# Patient Record
Sex: Female | Born: 1965 | Race: White | Hispanic: No | Marital: Married | Smoking: Former smoker
Health system: Southern US, Community
[De-identification: ages and names within clinical notes are randomized; demographics above are authoritative.]

## PROBLEM LIST (undated history)

## (undated) DIAGNOSIS — K648 Other hemorrhoids: Secondary | ICD-10-CM

## (undated) DIAGNOSIS — K219 Gastro-esophageal reflux disease without esophagitis: Secondary | ICD-10-CM

## (undated) DIAGNOSIS — A630 Anogenital (venereal) warts: Secondary | ICD-10-CM

## (undated) DIAGNOSIS — E039 Hypothyroidism, unspecified: Secondary | ICD-10-CM

## (undated) DIAGNOSIS — T7840XA Allergy, unspecified, initial encounter: Secondary | ICD-10-CM

## (undated) HISTORY — DX: Allergy, unspecified, initial encounter: T78.40XA

## (undated) HISTORY — DX: Other hemorrhoids: K64.8

## (undated) HISTORY — PX: OTHER SURGICAL HISTORY: SHX169

## (undated) HISTORY — DX: Gastro-esophageal reflux disease without esophagitis: K21.9

## (undated) HISTORY — DX: Anogenital (venereal) warts: A63.0

## (undated) HISTORY — PX: APPENDECTOMY: SHX54

## (undated) HISTORY — PX: DILATION AND CURETTAGE OF UTERUS: SHX78

---

## 1998-04-25 ENCOUNTER — Other Ambulatory Visit: Admission: RE | Admit: 1998-04-25 | Discharge: 1998-04-25 | Payer: Self-pay | Admitting: Obstetrics and Gynecology

## 1999-11-12 ENCOUNTER — Ambulatory Visit (HOSPITAL_COMMUNITY): Admission: RE | Admit: 1999-11-12 | Discharge: 1999-11-12 | Payer: Self-pay | Admitting: Obstetrics and Gynecology

## 1999-12-10 ENCOUNTER — Other Ambulatory Visit: Admission: RE | Admit: 1999-12-10 | Discharge: 1999-12-10 | Payer: Self-pay | Admitting: Obstetrics and Gynecology

## 2002-02-15 ENCOUNTER — Other Ambulatory Visit: Admission: RE | Admit: 2002-02-15 | Discharge: 2002-02-15 | Payer: Self-pay | Admitting: Obstetrics and Gynecology

## 2003-07-19 ENCOUNTER — Other Ambulatory Visit: Admission: RE | Admit: 2003-07-19 | Discharge: 2003-07-19 | Payer: Self-pay | Admitting: Obstetrics and Gynecology

## 2003-07-27 ENCOUNTER — Ambulatory Visit (HOSPITAL_BASED_OUTPATIENT_CLINIC_OR_DEPARTMENT_OTHER): Admission: RE | Admit: 2003-07-27 | Discharge: 2003-07-27 | Payer: Self-pay | Admitting: Obstetrics and Gynecology

## 2003-07-27 ENCOUNTER — Encounter (INDEPENDENT_AMBULATORY_CARE_PROVIDER_SITE_OTHER): Payer: Self-pay | Admitting: Specialist

## 2003-07-27 ENCOUNTER — Ambulatory Visit (HOSPITAL_COMMUNITY): Admission: RE | Admit: 2003-07-27 | Discharge: 2003-07-27 | Payer: Self-pay | Admitting: Obstetrics and Gynecology

## 2003-08-24 ENCOUNTER — Ambulatory Visit (HOSPITAL_COMMUNITY): Admission: RE | Admit: 2003-08-24 | Discharge: 2003-08-24 | Payer: Self-pay | Admitting: Obstetrics and Gynecology

## 2003-08-24 IMAGING — US US SONOHYSTEROGRAM - WITH RAD
1 series · 17 of 25 positions shown · non-contrast
Comparison: none

CLINICAL DATA: Evaluate for fibroids and endometrial lining.
TRANSABDOMINAL AND TRANSVAGINAL PELVIC ULTRASOUND 
Multiple images of the uterus and adnexa were obtained using a transabdominal and endovaginal approaches. 
The uterus has a maximal sagittal length of 8.3 cm and maximal AP width of 3.1 cm.  The uterine myometrium demonstrates two areas of focally altered echotexture.  One in the left lateral fundal region measuring 1.9 x 2.0 x 2.1 cm and a second located in the posterior upper uterine segment measuring 1.1 x 1.6 cm.  The endometrial canal appears trilayered in appearance with a maximal AP width of .8 cm.  No focal thickening is noted.  
The left ovary measures 3.3 x 2.2 x 2.2 cm and contains a collapsing corpus luteum cyst.  The right ovary was best seen transabdominally and measures 3.2 x 4.6 x 3.9 cm.  
No cul-de-sac or periovarian fluid is noted.
IMPRESSION
Fibroid uterus with fibroid sizes and locations as noted above.  Normal ovaries   
SONOHYSTEROGRAM:
Following sterile cleansing of the external cervical os with Betadine, sonohysterography was performed via the installation of a 5 French sonohysterography catheter which was stabilized in the endocervical canal with a saline-filled end catheter balloon.  Approximately 25 cc in total of saline was injected into the endometrial canal over the course of the exam.  It was difficult to fully distend the canal with saline due to a rapid egress of saline through the fallopian tubes.  
The posterior upper uterine segment fibroid demonstrates a partial submucosal component approximated at 40%.  The fundal fibroid does not have any submucosal component.  The endometrial canal appears thin and echogenic throughout with a single layer measurement of 2.5 mm.  No areas of focal thickening or inhomogeniety are identified and no findings suggestive of synechia are seen.
A partial submucosal component to the posterior upper segment fibroid is noted.  It should be noted that the overall sonohysterographic evaluation was limited by the inability to complete distend the endometrial canal with saline due to spill from the tubes.

[Series 1: us transvaginal non-ob · 17 of 49 slices shown]
[im 1/49]
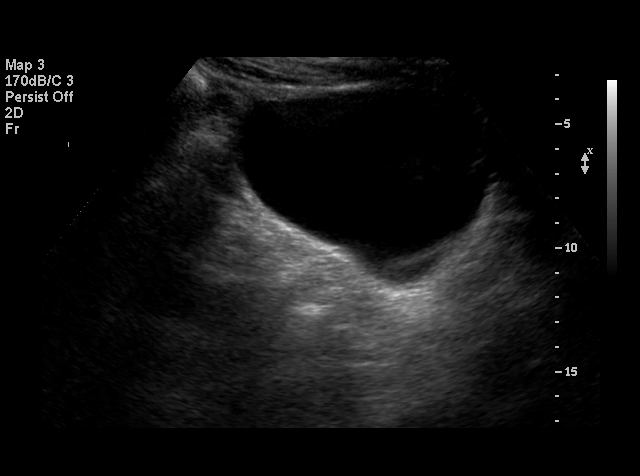
[im 5/49]
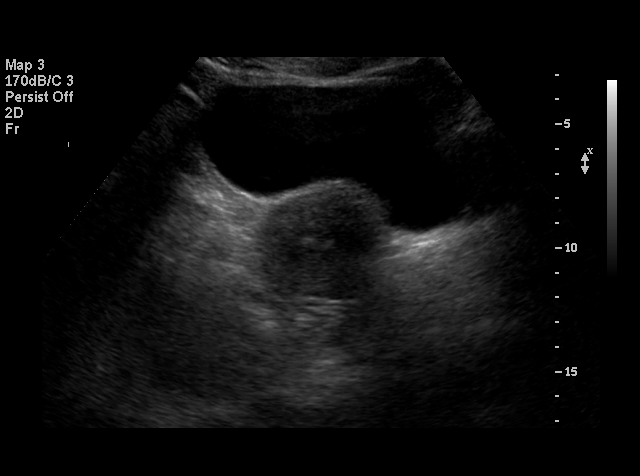
[im 7/49]
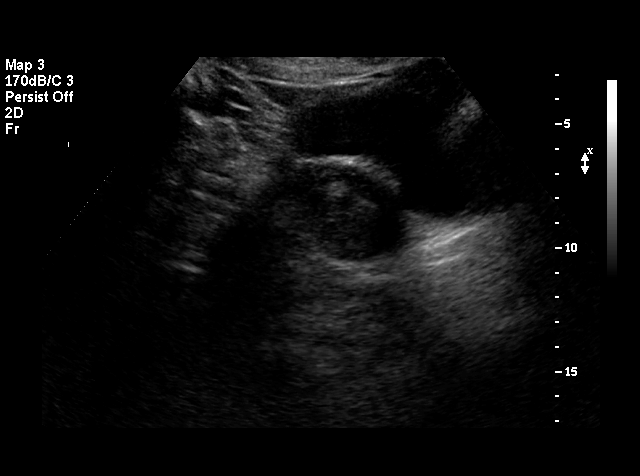
[im 11/49]
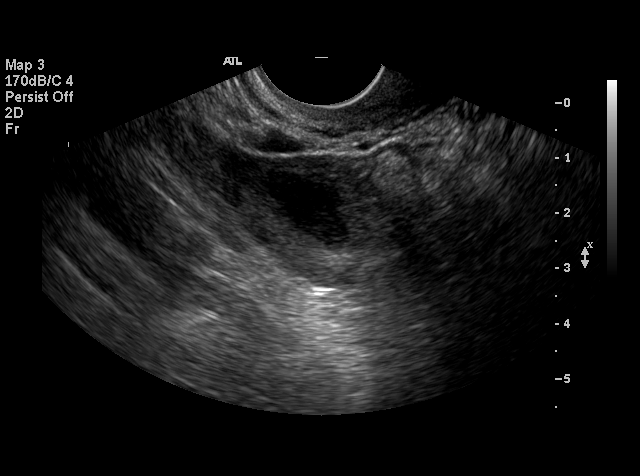
[im 13/49]
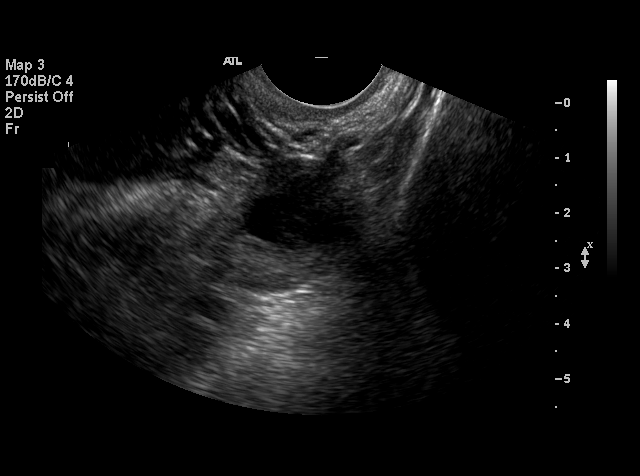
[im 17/49]
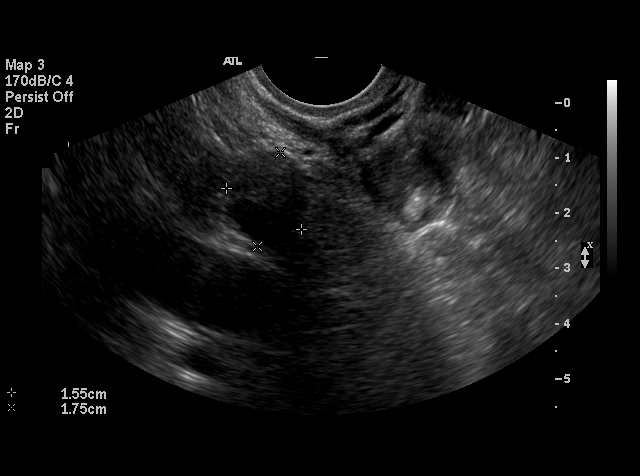
[im 19/49]
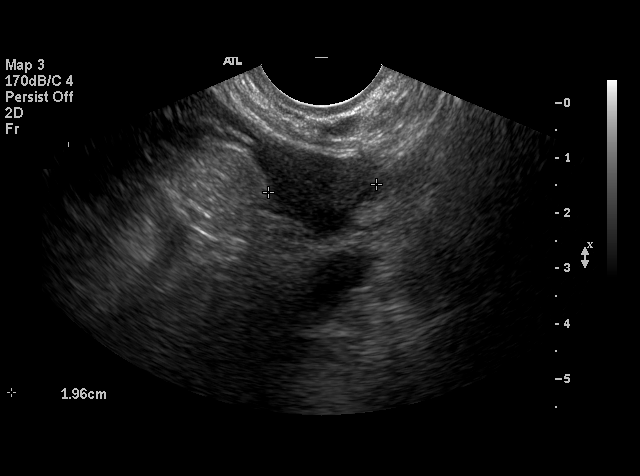
[im 23/49]
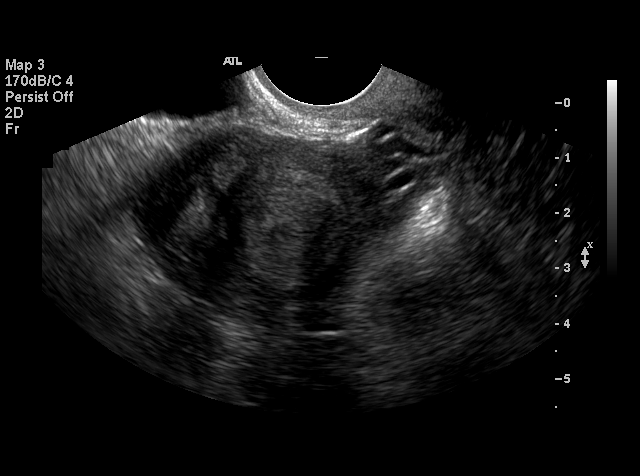
[im 25/49]
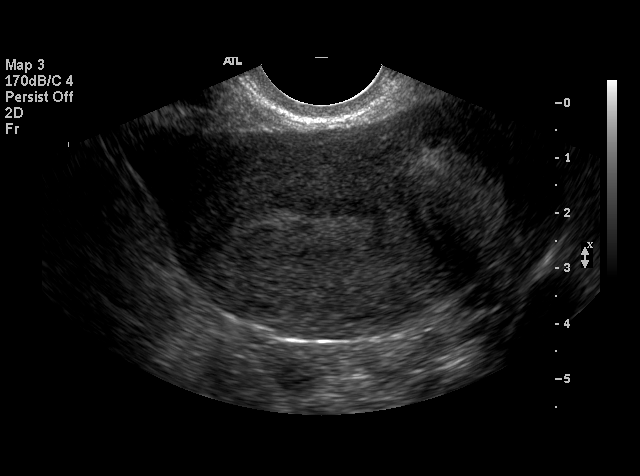
[im 27/49]
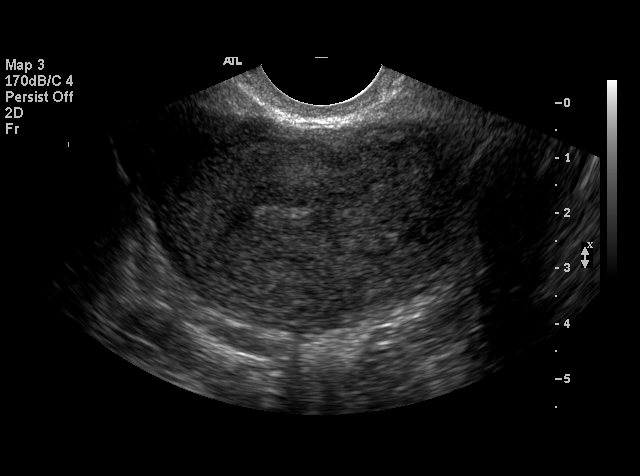
[im 31/49]
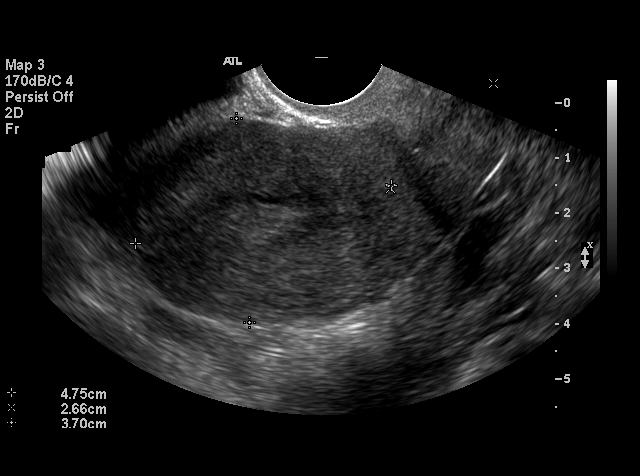
[im 33/49]
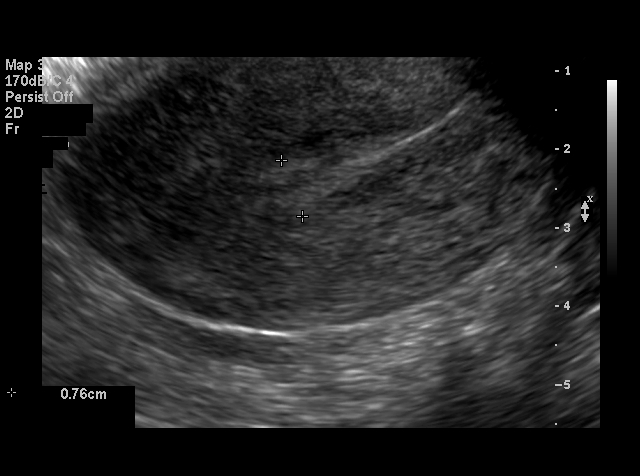
[im 37/49]
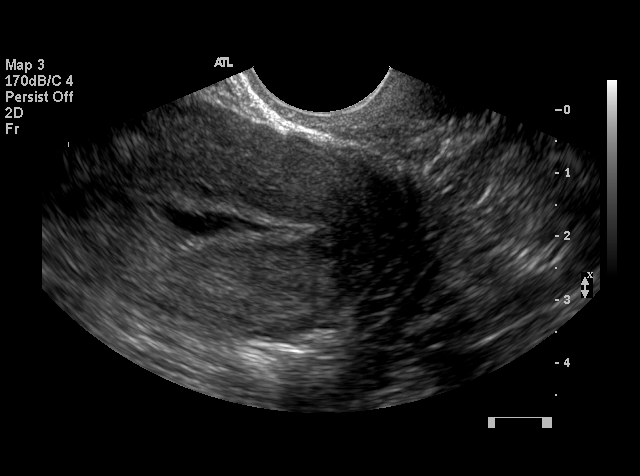
[im 39/49]
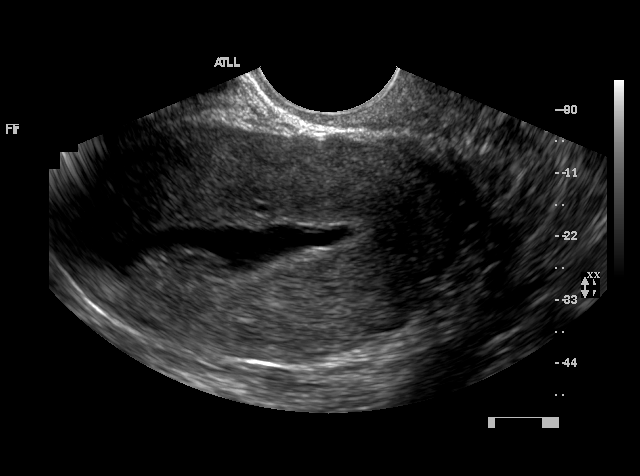
[im 43/49]
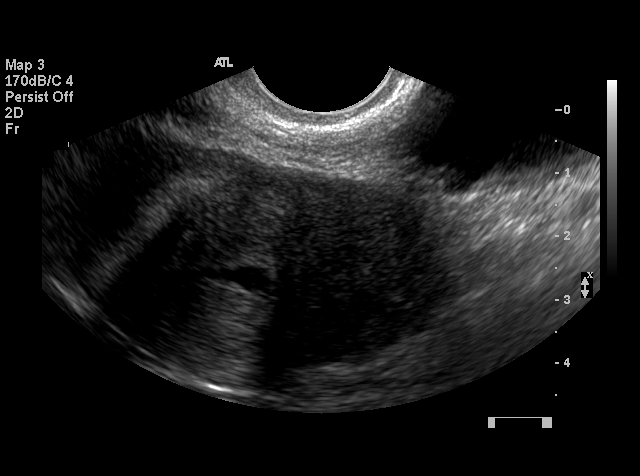
[im 45/49]
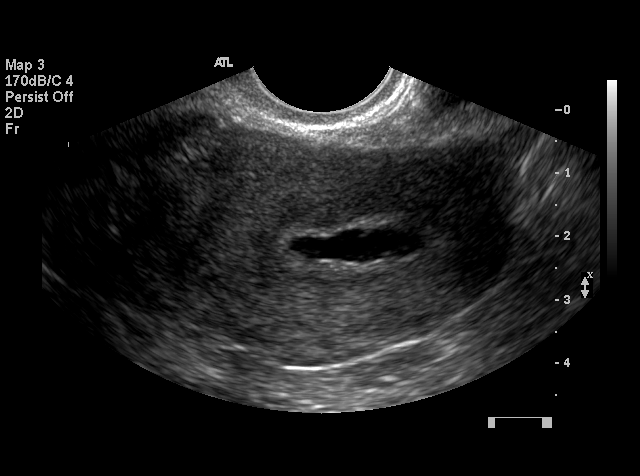
[im 49/49]
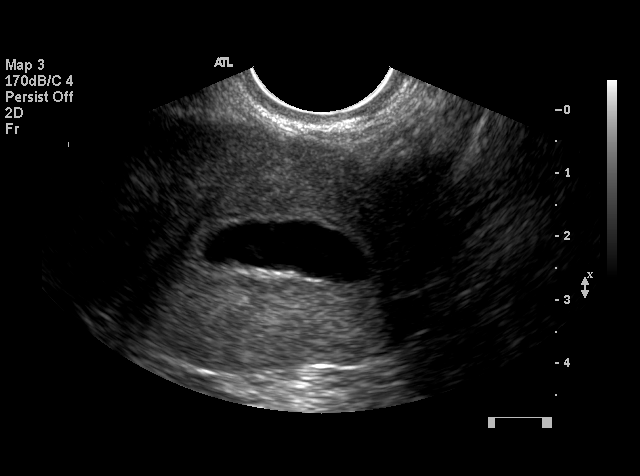

[17 of 25 positions shown; findings below may reference images not displayed]

## 2003-11-10 ENCOUNTER — Encounter (INDEPENDENT_AMBULATORY_CARE_PROVIDER_SITE_OTHER): Payer: Self-pay | Admitting: Specialist

## 2003-11-10 ENCOUNTER — Ambulatory Visit (HOSPITAL_COMMUNITY): Admission: AD | Admit: 2003-11-10 | Discharge: 2003-11-10 | Payer: Self-pay | Admitting: Obstetrics and Gynecology

## 2003-11-10 IMAGING — US US OB TRANSVAGINAL MODIFY
1 series · 18 of 28 positions shown · non-contrast
Comparison: none

CLINICAL DATA: 7 week 6 day gestational age by LMP.  Previous IUP seen on outside ultrasound.  Bleeding. Evaluate for missed abortion or retained products.  Fibroids.
 OBSTETRICAL ULTRASOUND WITH TRANSVAGINAL:
 Comparison is made with prior study on [DATE].
 There is no sonographic evidence of an intrauterine gestational sac.  No fluid is seen within the endometrial cavity.  The endometrium measures approximately 7 mm in thickness.  A fibroid is seen in the left upper uterine body measuring approximately 2.2 x 2.2 x 2.0 cm.  This has not significantly changed in size since prior study.  Other previously seen submucosal fibroid in the posterior uterine wall is not well visualized on the current study. 
 A simple right ovarian cyst is seen measuring 2.1 x 2.5 x 2.2 cm.  This is consistent with a corpus luteum cyst.  The left ovary is normal in appearance.  No other adnexal masses or free fluid are visualized by either transabdominal or transvaginal imaging.
 IMPRESSION
 No evidence of intrauterine gestational sac or retained products of conception.  
 2.2 cm fibroid in the left upper uterine body.
 2.5 cm simple right ovarian cyst, consistent with corpus luteum.  Normal appearance of the left ovary.

[Series 1: us ob transvaginal modify · 18 of 57 slices shown]
[im 1/57]
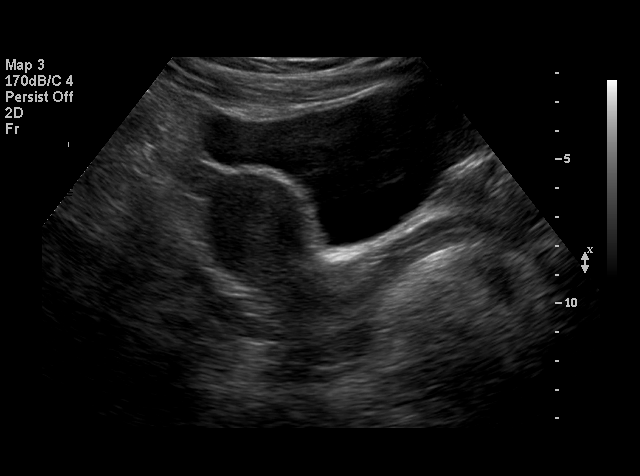
[im 5/57]
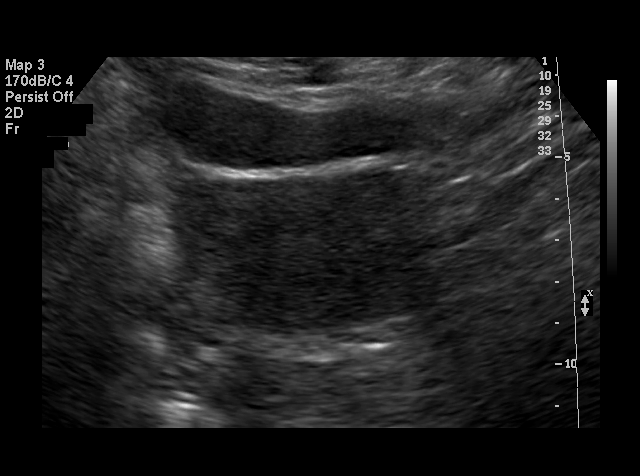
[im 7/57]
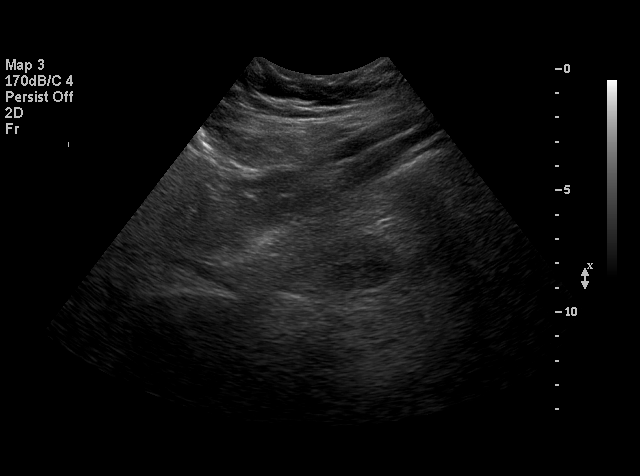
[im 11/57]
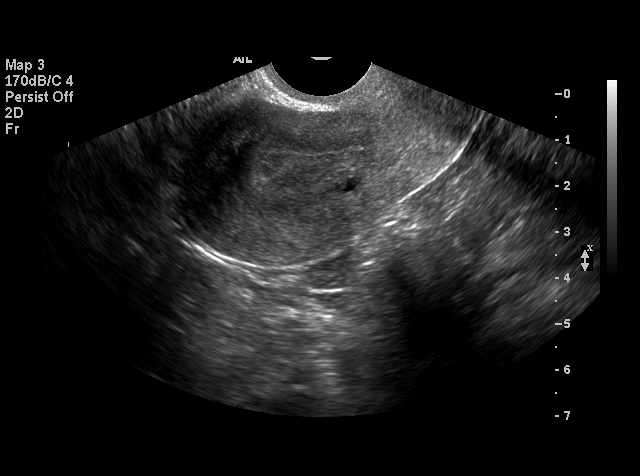
[im 15/57]
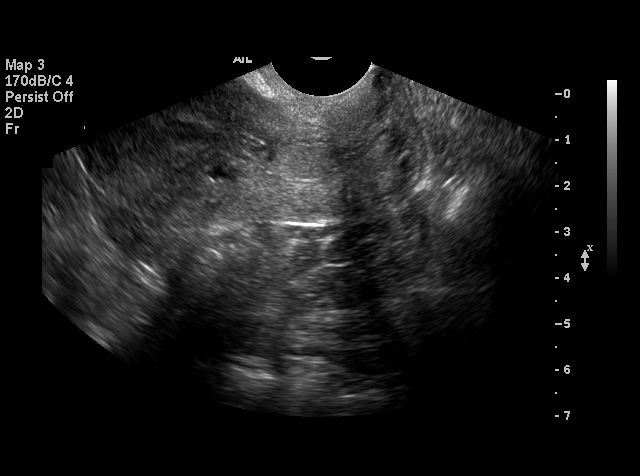
[im 17/57]
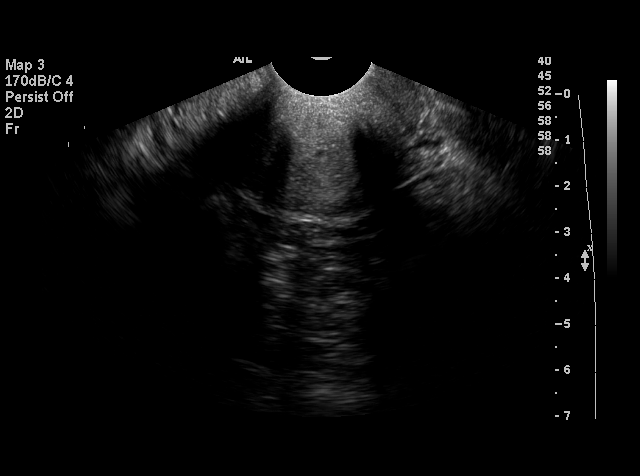
[im 21/57]
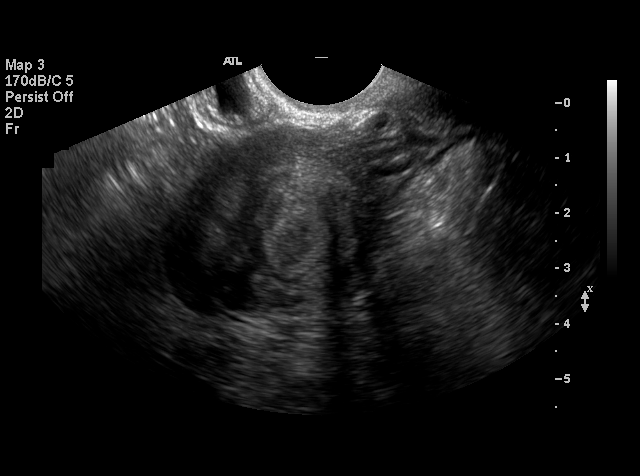
[im 23/57]
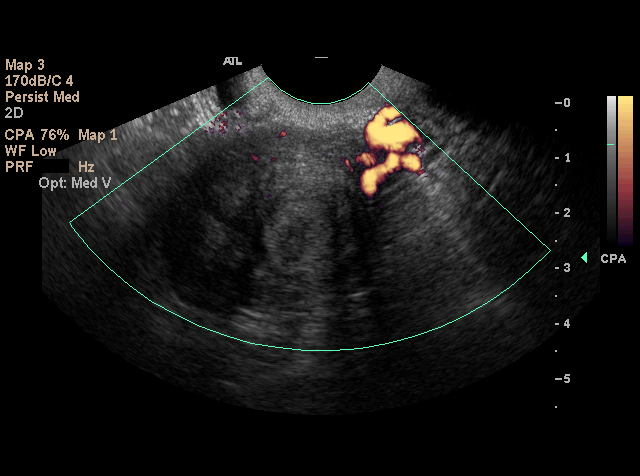
[im 27/57]
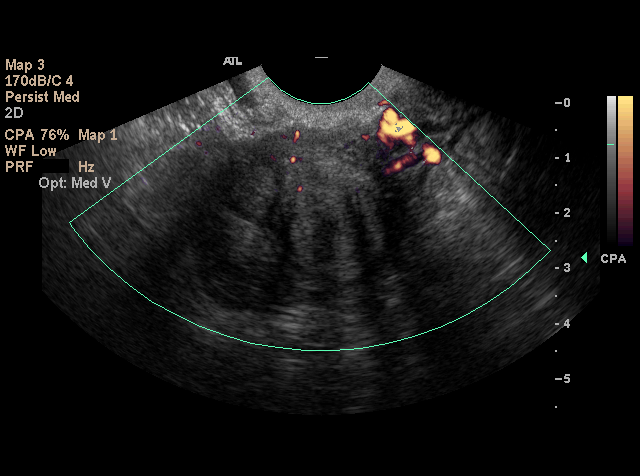
[im 30/57]
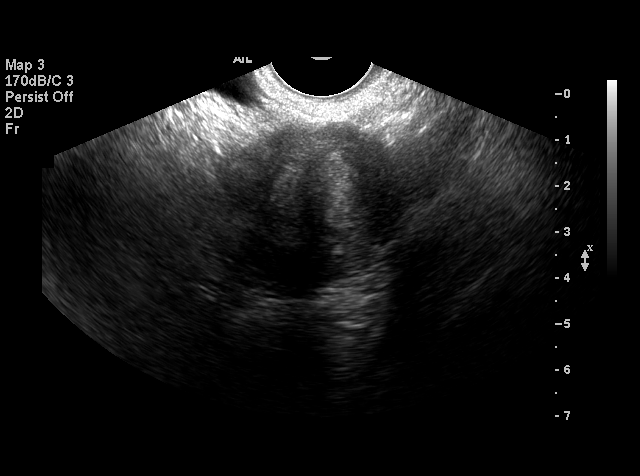
[im 34/57]
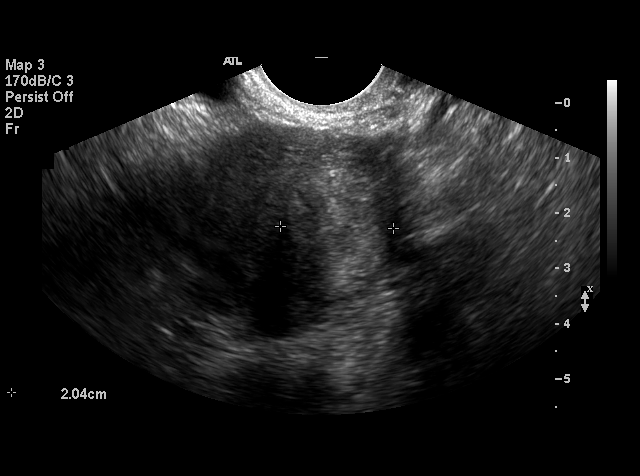
[im 36/57]
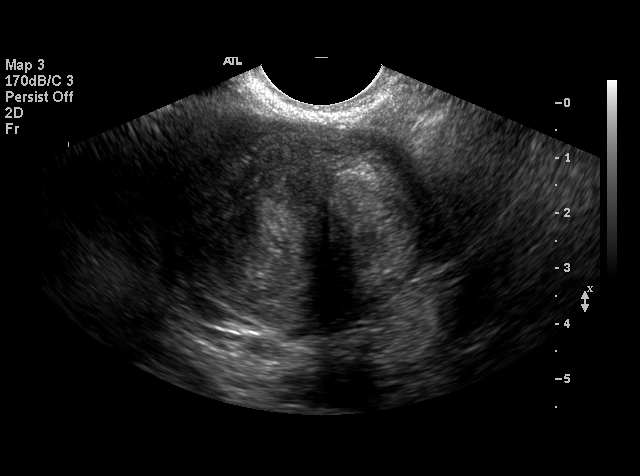
[im 40/57]
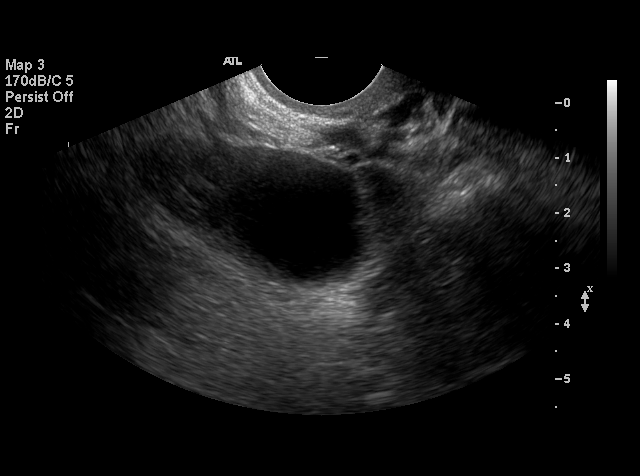
[im 44/57]
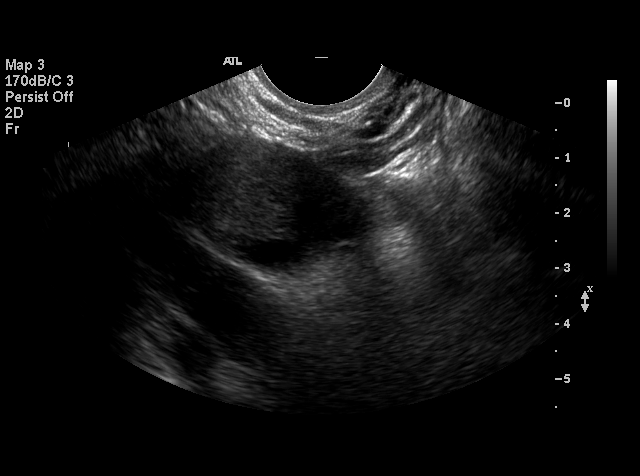
[im 46/57]
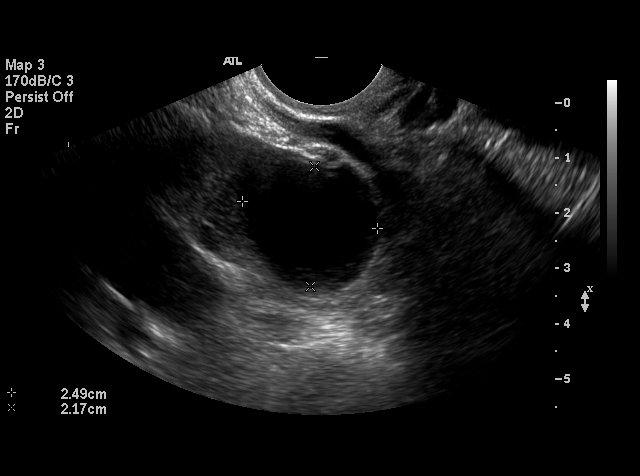
[im 50/57]
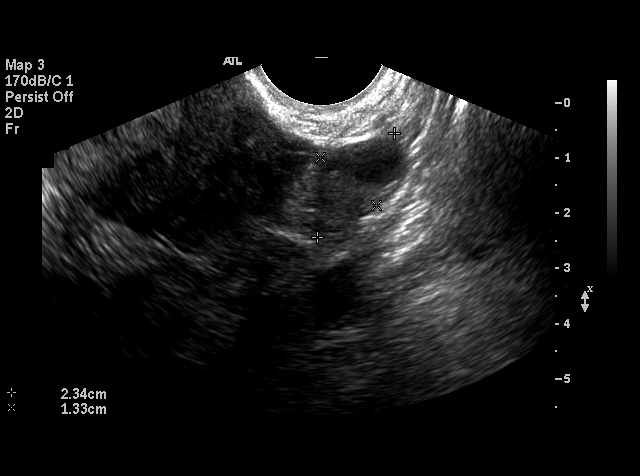
[im 52/57]
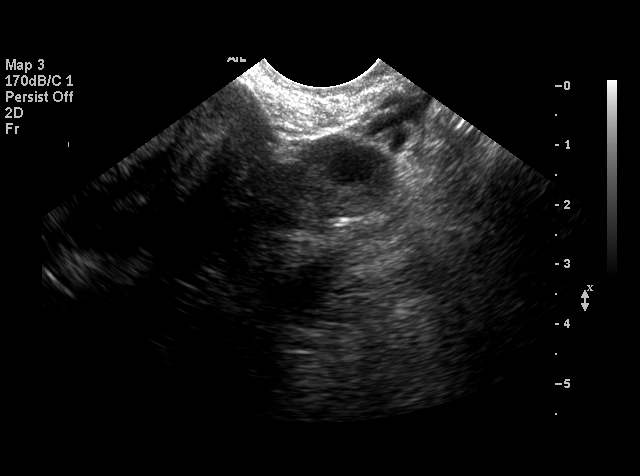
[im 57/57]
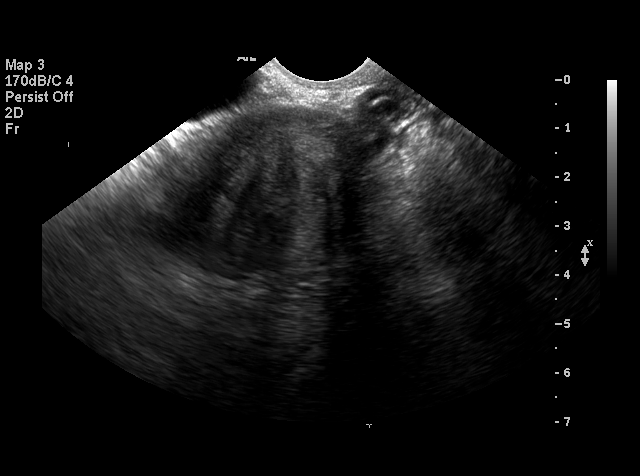

[18 of 28 positions shown; findings below may reference images not displayed]

## 2004-09-26 ENCOUNTER — Ambulatory Visit: Payer: Self-pay | Admitting: Internal Medicine

## 2004-11-28 ENCOUNTER — Ambulatory Visit: Payer: Self-pay | Admitting: Internal Medicine

## 2004-12-04 ENCOUNTER — Ambulatory Visit: Payer: Self-pay

## 2004-12-18 ENCOUNTER — Other Ambulatory Visit: Admission: RE | Admit: 2004-12-18 | Discharge: 2004-12-18 | Payer: Self-pay | Admitting: Obstetrics and Gynecology

## 2005-01-01 ENCOUNTER — Ambulatory Visit: Payer: Self-pay | Admitting: Internal Medicine

## 2006-10-09 ENCOUNTER — Ambulatory Visit: Payer: Self-pay | Admitting: Internal Medicine

## 2007-01-05 LAB — CONVERTED CEMR LAB: Pap Smear: NORMAL

## 2007-01-19 IMAGING — US MAMMO-RUNI-US
1 series · 5 of 5 positions shown · non-contrast
Comparison: NONE

CLINICAL DATA: BANDAOGO RT(R)(M)(CT)   
Diagnostic  Mammogram. 

RIGHT BREAST MAMMOGRAM ADDITIONAL VIEWS AND RIGHT BREAST 
ULTRASOUND

[Series 1: us breast · 0.06mm/px · 5 of 5 slices shown]
[im 1/5]
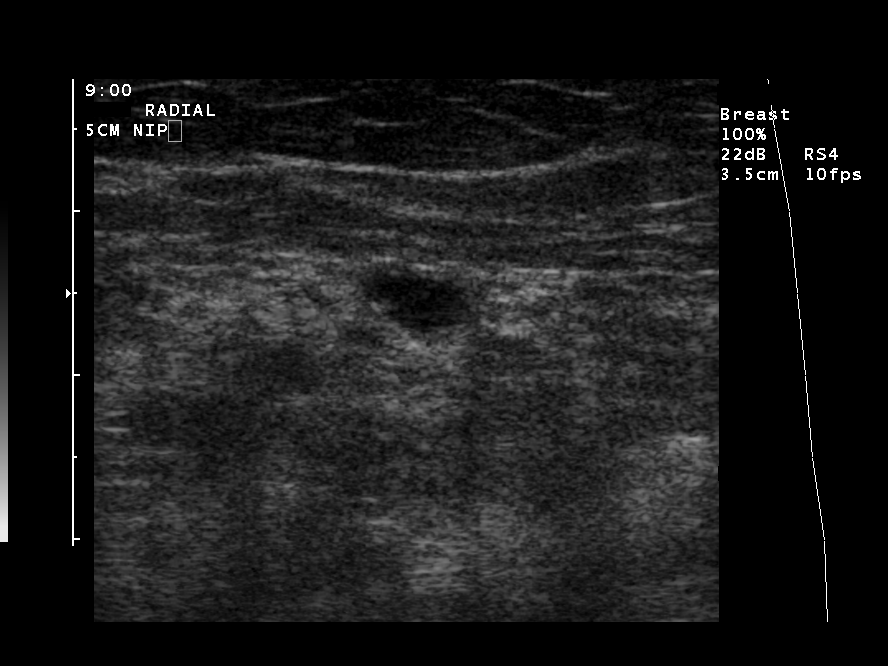
[im 2/5]
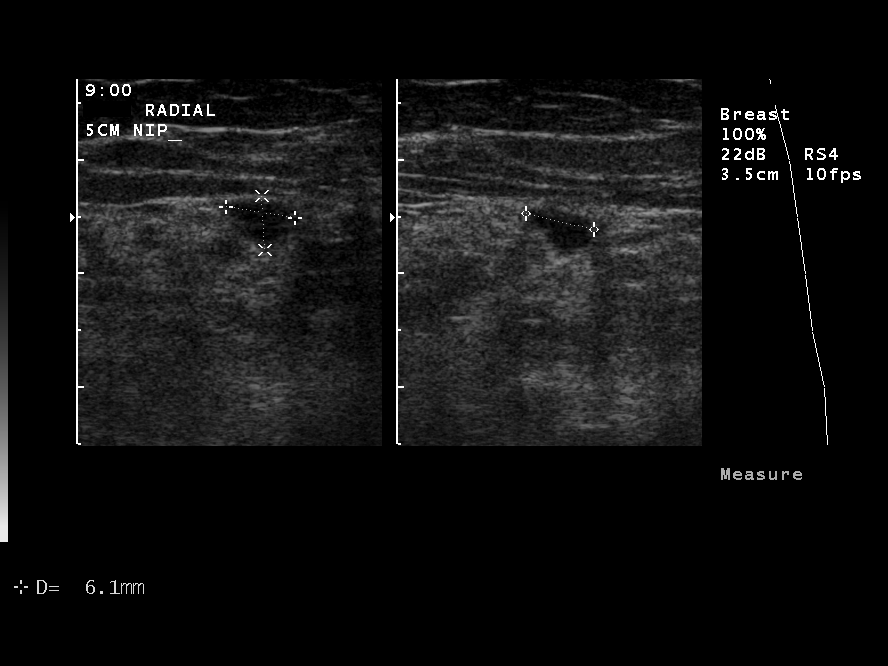
[im 3/5]
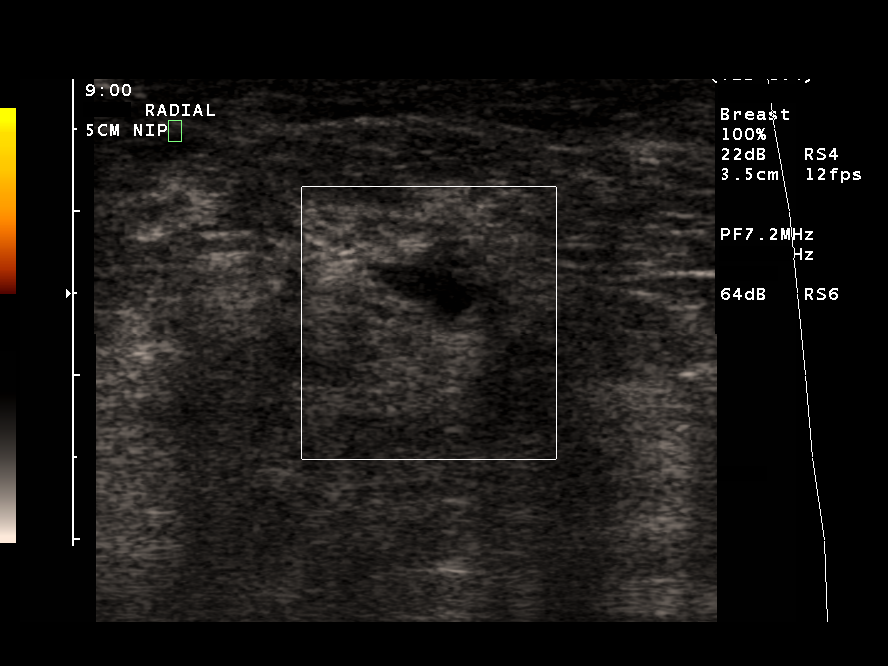
[im 4/5]
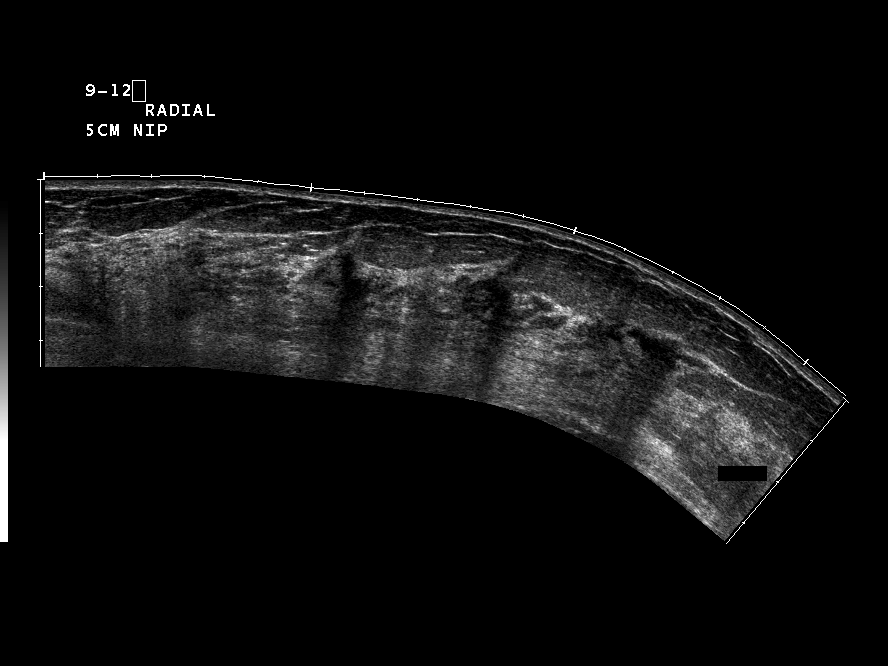
[im 5/5]
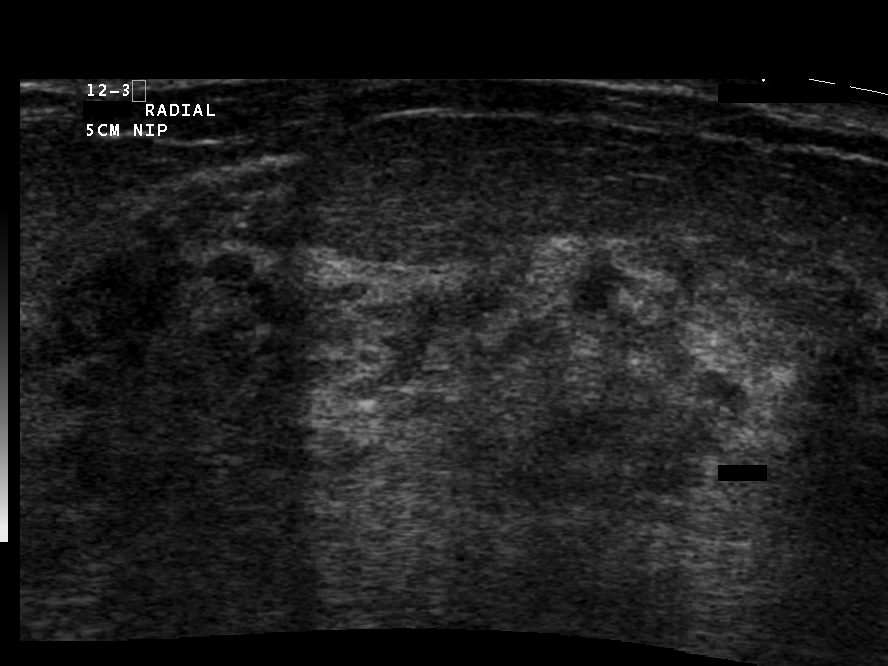

[5 of 5 positions shown; findings below may reference images not displayed]

FINDINGS: There is moderately dense breast parenchyma.  A spot 
compression view was obtained in the upper half of the breast in 
the MLO position.  A repeat MLO view was also obtained.  There are 
benign appearing calcifications seen.  No evidence of spiculated 
mass or architectural distortion.  An exaggerated lateral CC view 
was also obtained.  No dominant mass was noted. Ultrasound was 
performed of the upper half of the right breast.  There is a 
hypoechoic, oval shaped, well-circumscribed mass seen at the [DATE] 
position, 5 cm from the nipple.  There is enhanced through 
transmission.  This has a smooth margin.  There is some internal 
echogenicity suggested.  No abnormal acoustic attenuation.
IMPRESSION: No mammographic or ultrasonographic evidence of 
malignancy. Hypoechoic lesion in the [DATE] position of the right 
breast 5 cm from the nipple that may represent a complex cyst or 
solid nodule.  I would favor the former.  I would recommend a 
six-month interval follow-up right breast ultrasound to 
re-evaluate this finding. The patient was informed at the time of 
the examination of the findings and recommendations by verbal and 
written lay report. Computer assisted (Second Look) technology was 
used as an aid in interpretation of this study. BI-RADS 3 - 
Probably Benign BANDAOGO electronically reviewed on 
[DATE] Dict Date: [DATE]  Tran Date: [DATE] BANDAOGO

## 2007-02-16 ENCOUNTER — Ambulatory Visit: Payer: Self-pay | Admitting: Internal Medicine

## 2007-02-16 LAB — CONVERTED CEMR LAB
ALT: 12 units/L (ref 0–35)
AST: 11 units/L (ref 0–37)
Albumin: 3.3 g/dL — ABNORMAL LOW (ref 3.5–5.2)
Alkaline Phosphatase: 53 units/L (ref 39–117)
BUN: 7 mg/dL (ref 6–23)
Basophils Absolute: 0 10*3/uL (ref 0.0–0.1)
Basophils Relative: 0.4 % (ref 0.0–1.0)
Bilirubin Urine: NEGATIVE
Bilirubin, Direct: 0.1 mg/dL (ref 0.0–0.3)
Blood in Urine, dipstick: NEGATIVE
CO2: 29 meq/L (ref 19–32)
Calcium: 9 mg/dL (ref 8.4–10.5)
Chloride: 109 meq/L (ref 96–112)
Cholesterol: 186 mg/dL (ref 0–200)
Creatinine, Ser: 0.7 mg/dL (ref 0.4–1.2)
Eosinophils Absolute: 0.1 10*3/uL (ref 0.0–0.6)
Eosinophils Relative: 1.2 % (ref 0.0–5.0)
GFR calc Af Amer: 117 mL/min
GFR calc non Af Amer: 97 mL/min
Glucose, Bld: 96 mg/dL (ref 70–99)
Glucose, Urine, Semiquant: NEGATIVE
HCT: 33.9 % — ABNORMAL LOW (ref 36.0–46.0)
HDL: 91.9 mg/dL (ref >39.0)
Hemoglobin: 12 g/dL (ref 12.0–15.0)
Ketones, urine, test strip: NEGATIVE
LDL Cholesterol: 80 mg/dL (ref 0–99)
Lymphocytes Relative: 29.9 % (ref 12.0–46.0)
MCHC: 35.4 g/dL (ref 30.0–36.0)
MCV: 87.9 fL (ref 78.0–100.0)
Monocytes Absolute: 0.4 10*3/uL (ref 0.2–0.7)
Monocytes Relative: 6 % (ref 3.0–11.0)
Neutro Abs: 4 10*3/uL (ref 1.4–7.7)
Neutrophils Relative %: 62.5 % (ref 43.0–77.0)
Nitrite: NEGATIVE
Platelets: 282 10*3/uL (ref 150–400)
Potassium: 3.6 meq/L (ref 3.5–5.1)
Protein, U semiquant: NEGATIVE
RBC: 3.86 M/uL — ABNORMAL LOW (ref 3.87–5.11)
RDW: 11.5 % (ref 11.5–14.6)
Sodium: 143 meq/L (ref 135–145)
Specific Gravity, Urine: 1.02
TSH: 2.07 microintl units/mL (ref 0.35–5.50)
Total Bilirubin: 0.8 mg/dL (ref 0.3–1.2)
Total CHOL/HDL Ratio: 2
Total Protein: 6.3 g/dL (ref 6.0–8.3)
Triglycerides: 69 mg/dL (ref 0–149)
Urobilinogen, UA: NEGATIVE
VLDL: 14 mg/dL (ref 0–40)
WBC: 6.4 10*3/uL (ref 4.5–10.5)
pH: 7

## 2007-02-22 ENCOUNTER — Ambulatory Visit: Payer: Self-pay | Admitting: Internal Medicine

## 2007-02-22 DIAGNOSIS — K219 Gastro-esophageal reflux disease without esophagitis: Secondary | ICD-10-CM

## 2007-02-22 HISTORY — DX: Gastro-esophageal reflux disease without esophagitis: K21.9

## 2007-05-25 ENCOUNTER — Telehealth: Payer: Self-pay | Admitting: Internal Medicine

## 2007-05-25 ENCOUNTER — Encounter: Payer: Self-pay | Admitting: Internal Medicine

## 2007-05-25 ENCOUNTER — Ambulatory Visit (HOSPITAL_COMMUNITY): Admission: RE | Admit: 2007-05-25 | Discharge: 2007-05-25 | Payer: Self-pay | Admitting: Family Medicine

## 2007-05-25 IMAGING — CT CT ANGIO CHEST
3 of 4 series · 19 of 30 positions shown · IV contrast (100 ML OMNI 300)
Comparison: None.

CLINICAL DATA: 41-year-old female with chest pain and right arm pain. 
 CT ANGIOGRAPHY OF CHEST:
TECHNIQUE: Multidetector CT imaging of the chest was performed during bolus injection of intravenous contrast.  Multiplanar CT angiographic image reconstructions were generated to evaluate the vascular anatomy.
 Contrast:  100 cc Omnipaque 300

[Series 2: pe · axial · 0.70mm/px · z∈[-280,-46]mm · 10 of 235 slices shown]
[im 24/235  lung]
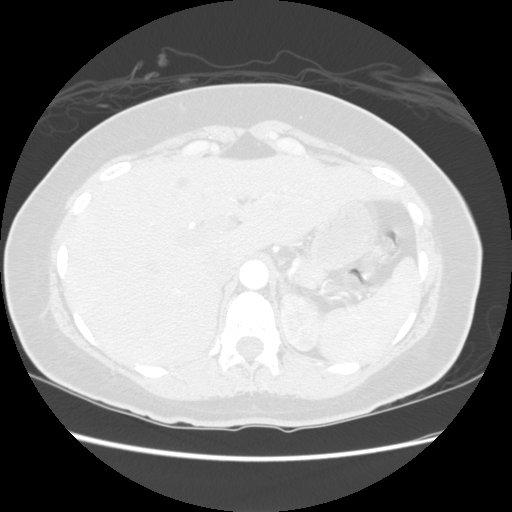
[im 47/235  mediastinal]
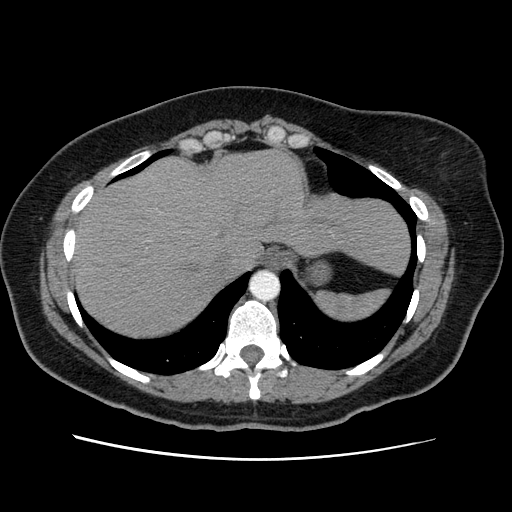
[im 71/235  lung]
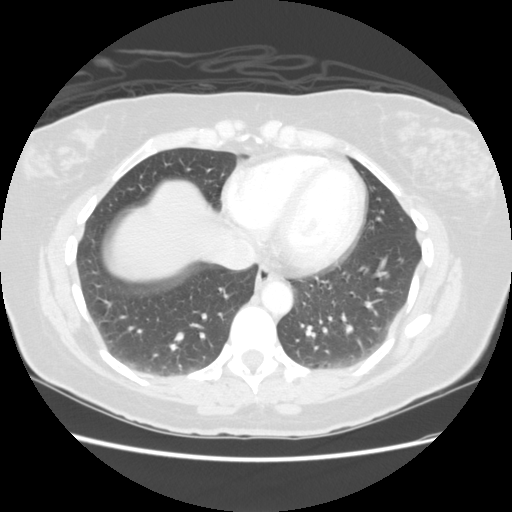
[im 94/235  mediastinal]
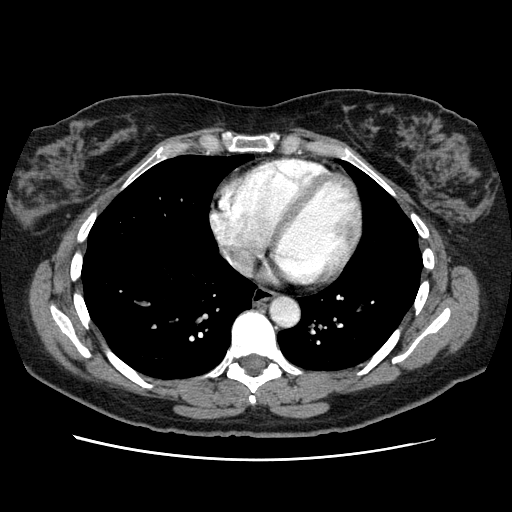
[im 118/235  lung]
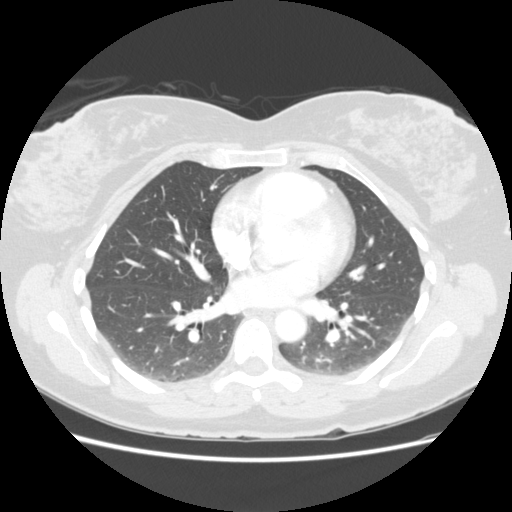
[im 124/235  mediastinal]
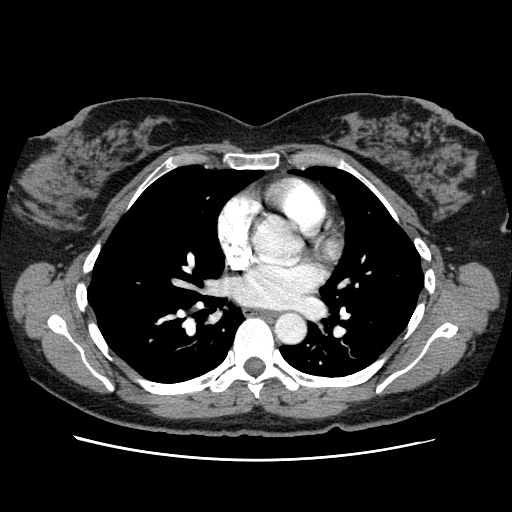
[im 141/235  lung]
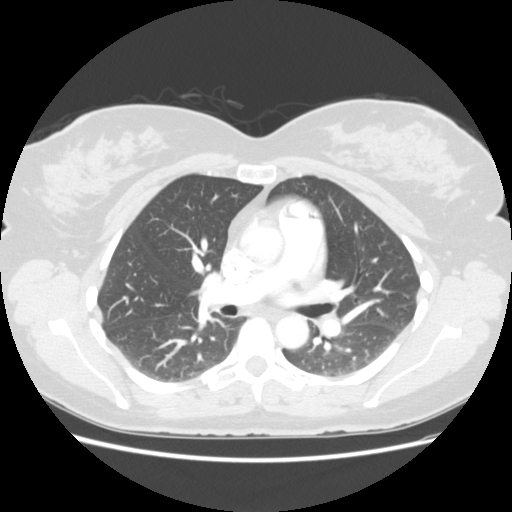
[im 164/235  mediastinal]
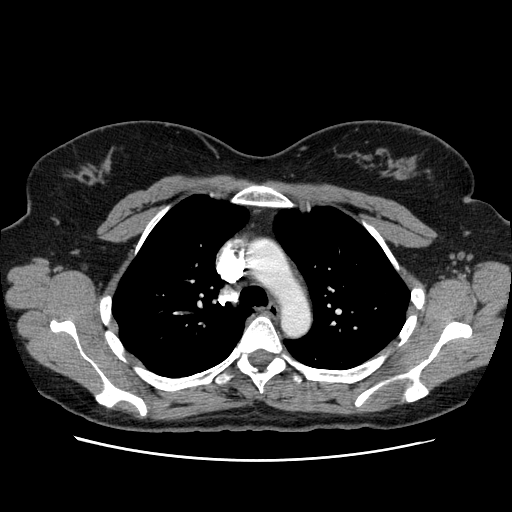
[im 188/235  lung]
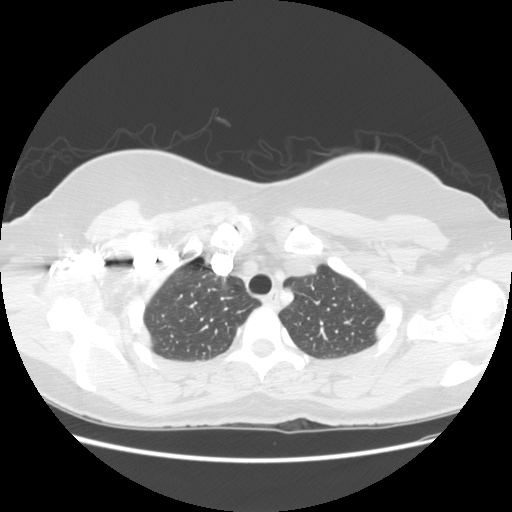
[im 211/235  mediastinal]
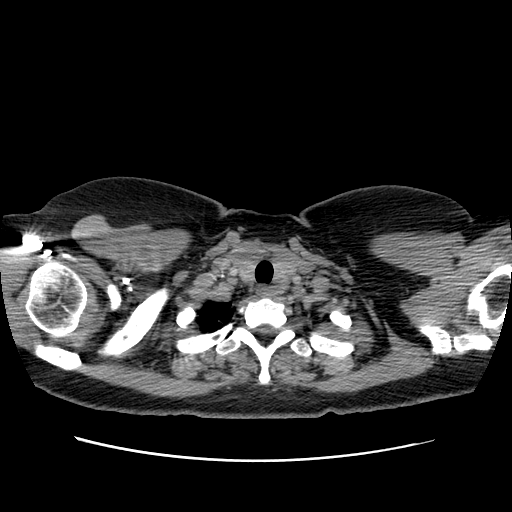

[Series 3: recon 2: pe · axial · 0.70mm/px · z∈[-236,-91]mm · 4 of 118 slices shown]
[im 30/118  lung]
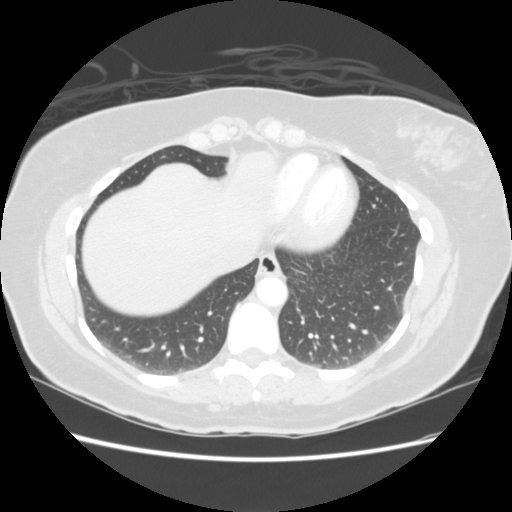
[im 59/118  lung]
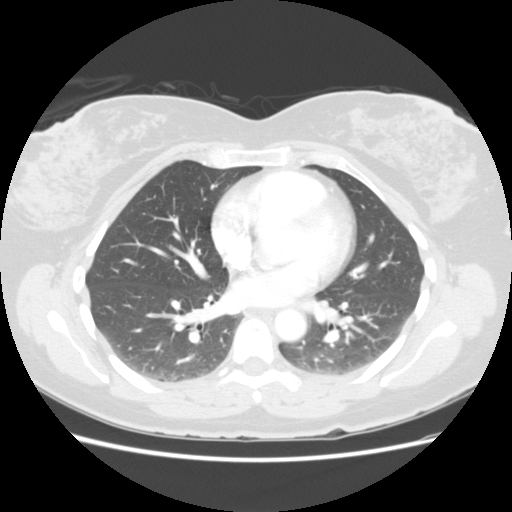
[im 63/118  lung]
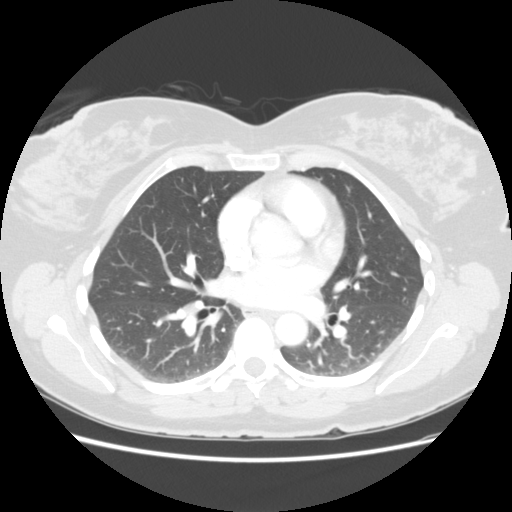
[im 88/118  lung]
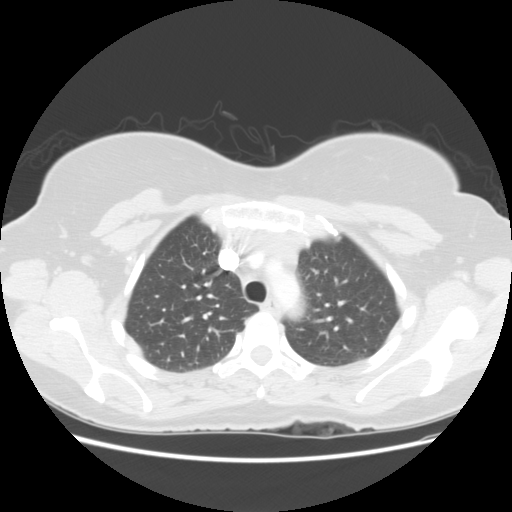

[Series 201: reformatted · sagittal · 0.70mm/px · 5 of 161 slices shown]
[im 27/161  lung]
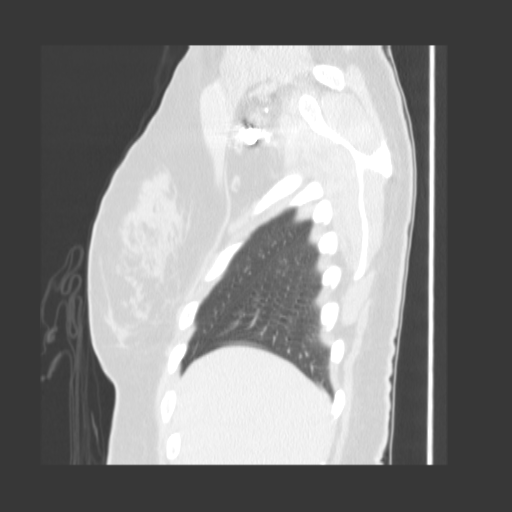
[im 54/161  lung]
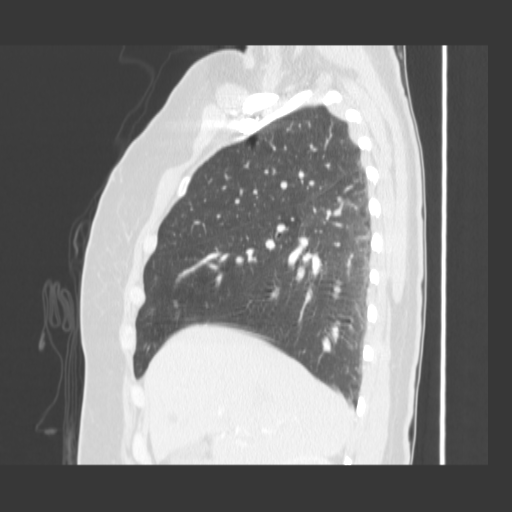
[im 81/161  lung]
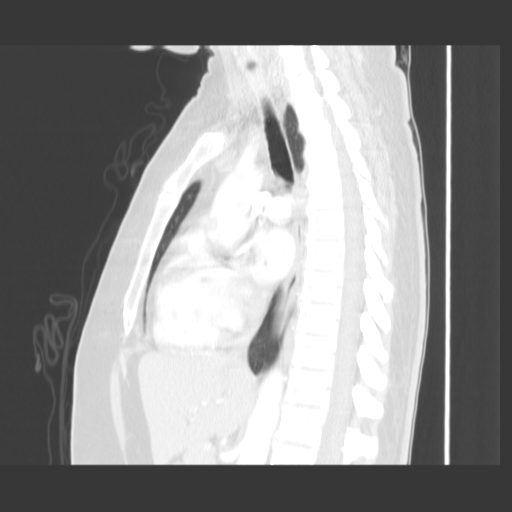
[im 107/161  lung]
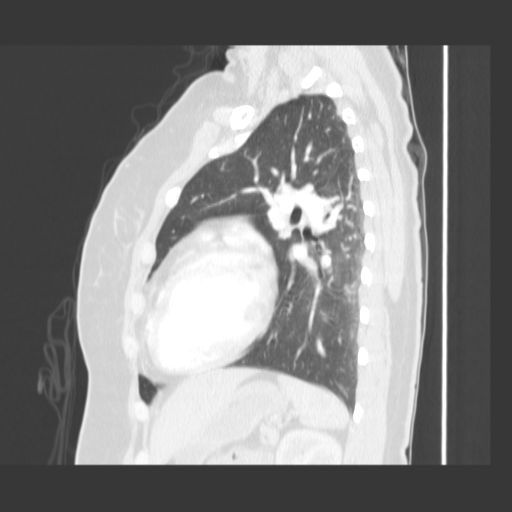
[im 134/161  lung]
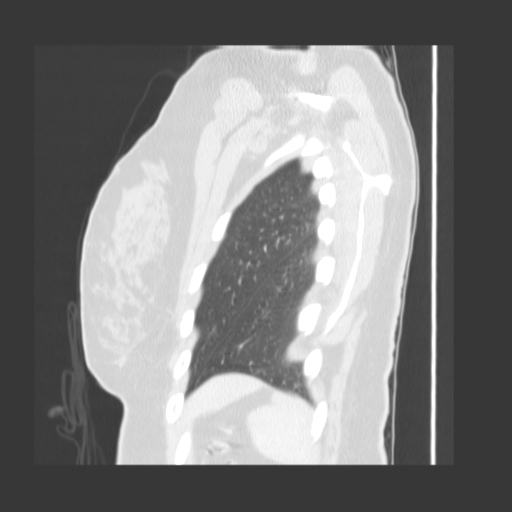

[19 of 30 positions shown; findings below may reference images not displayed]

FINDINGS: Adequate contrast bolus timing in the pulmonary arterial system.  There is mild respiratory motion artifact at the bases.   There is streak artifact from dense contrast in the superior vena cava.  No focal filling defect is identified within the pulmonary arterial tree to suggest the presence of acute pulmonary embolus. Gas is incidentally noted non-dependently in the main pulmonary artery, commonly associated with intravenous injection.  Other major vascular structures in the mediastinum are also normally enhancing.  
 No pericardial or pleural effusion.  No axillary or mediastinal lymphadenopathy.  Visualized upper abdominal viscera are remarkable for an 11 x 8 mm low density area in the right hepatic lobe with densitometry suggesting a cyst.  Other upper abdominal structures are unremarkable. 
 Mild dependent atelectasis.  Major airways are patent.  Lungs are otherwise clear.  No osseous lesion.
IMPRESSION: 1.  No evidence of pulmonary embolus. 
 2.  No acute cardiopulmonary abnormality.
 3.  Probable 8 x 11 mm right hepatic cyst.

## 2007-05-27 ENCOUNTER — Ambulatory Visit: Payer: Self-pay | Admitting: Internal Medicine

## 2007-08-16 IMAGING — US US-BREAST([ID])
1 series · 6 of 6 positions shown · non-contrast
Comparison: NONE

CLINICAL DATA: Six-month follow-up. 

RIGHT BREAST ULTRASOUND

[Series 1: us breast · 0.05mm/px · 6 of 6 slices shown]
[im 1/6]
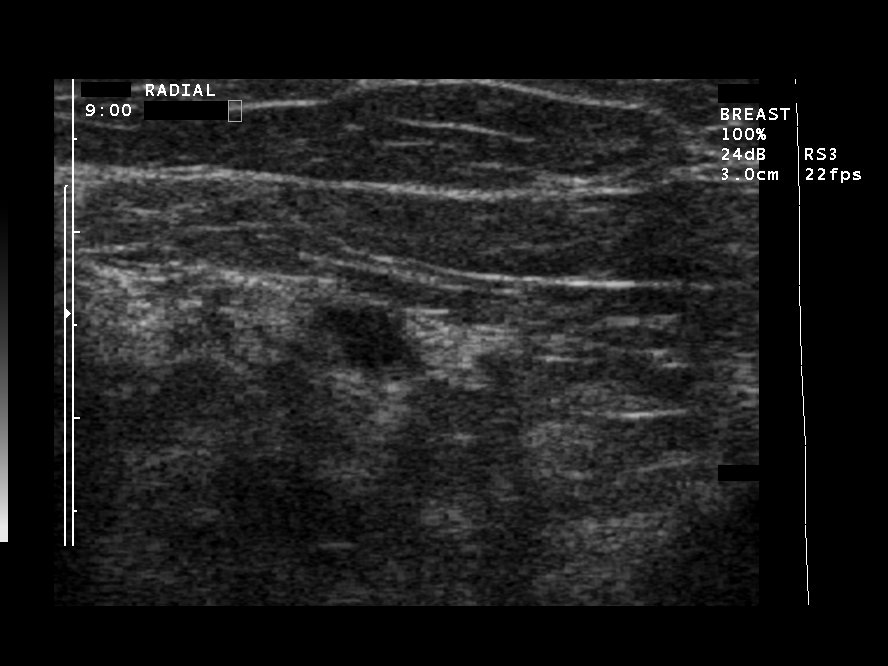
[im 2/6]
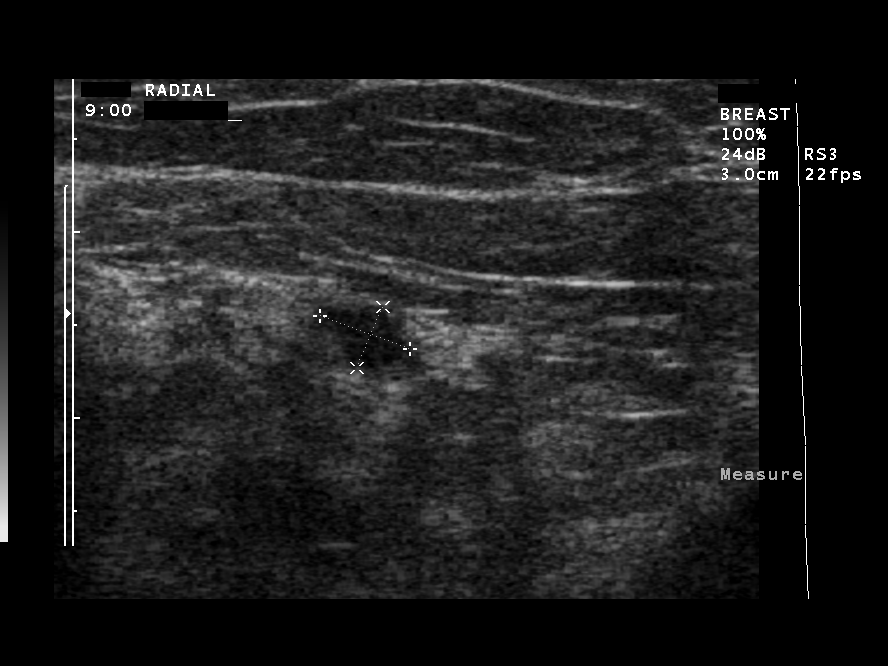
[im 3/6]
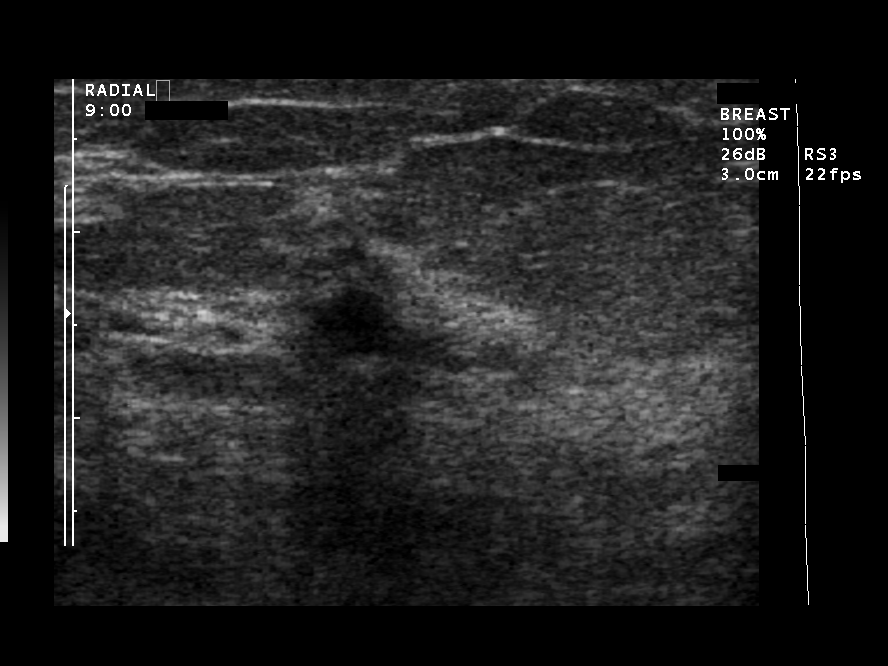
[im 4/6]
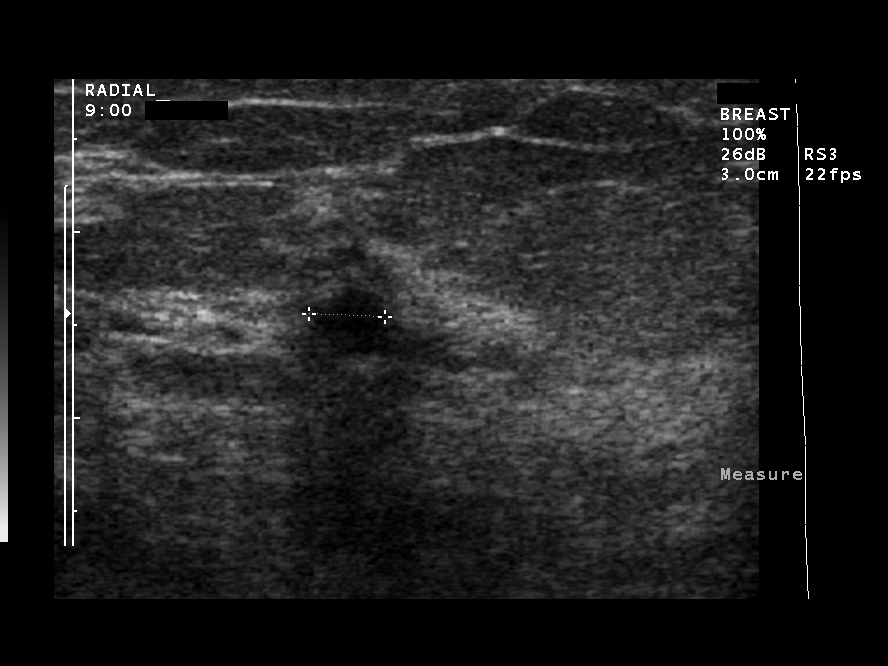
[im 5/6]
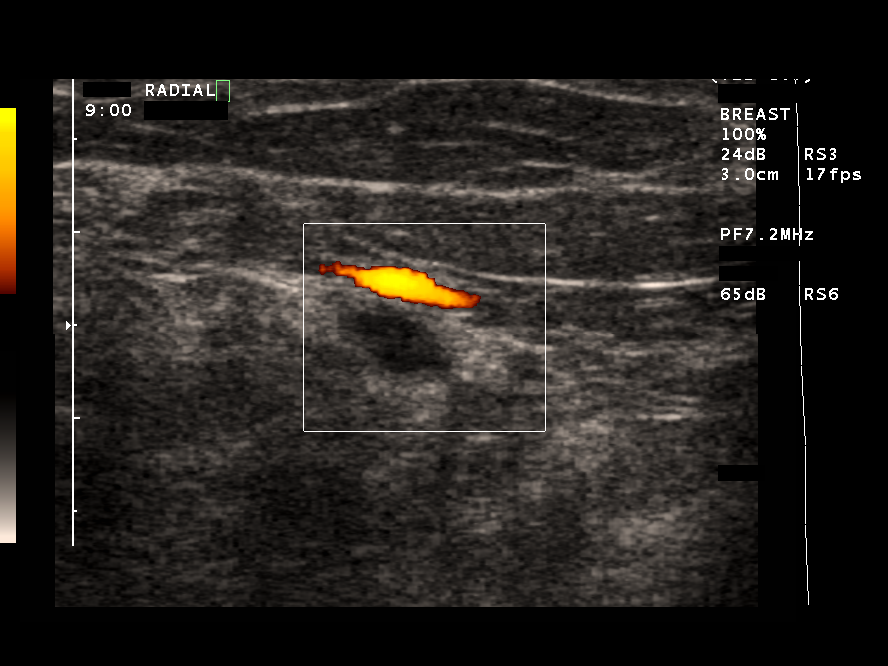
[im 6/6]
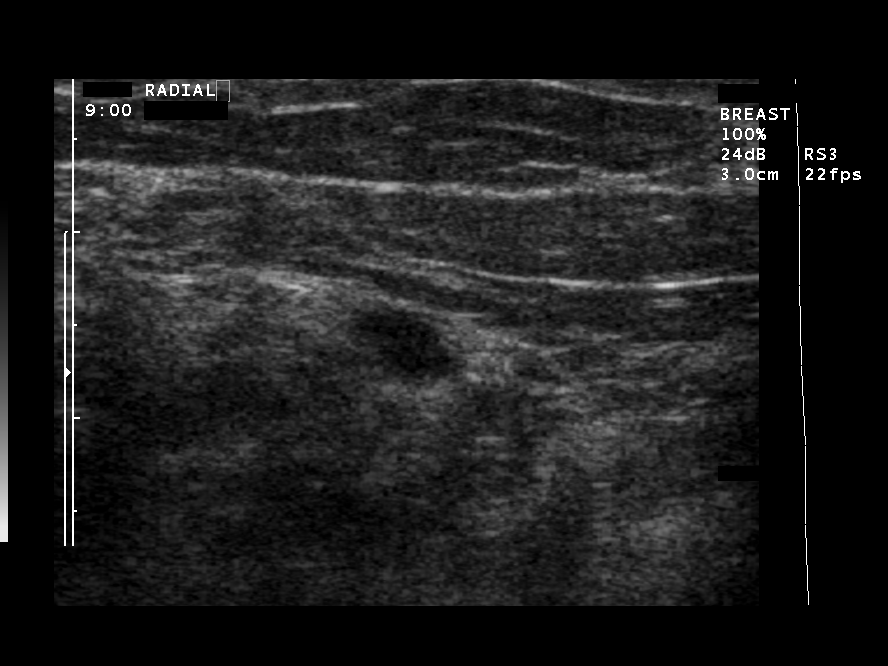

[6 of 6 positions shown; findings below may reference images not displayed]

FINDINGS: Compared with [DATE] study.  Stable area of 
hypoechogenicity with associated fibrocystic changes is noted in 
the 9 o???clock position.  The lesion is slightly smaller in size.
IMPRESSION: Probable benign fibrocystic changes in the right 
breast.  Bilateral mammogram in [DATE] is recommended as 
well as one more right breast ultrasound. BI-RADS 2- Benign VAKHO 
[DATE]  Tran Date:  [DATE] VAKHO

## 2007-12-20 ENCOUNTER — Ambulatory Visit: Payer: Self-pay | Admitting: Internal Medicine

## 2008-07-05 ENCOUNTER — Ambulatory Visit: Payer: Self-pay | Admitting: Family Medicine

## 2008-07-21 ENCOUNTER — Ambulatory Visit: Payer: Self-pay | Admitting: Internal Medicine

## 2008-08-08 ENCOUNTER — Ambulatory Visit: Payer: Self-pay | Admitting: Gastroenterology

## 2008-08-08 LAB — CONVERTED CEMR LAB
Basophils Relative: 1.3 % (ref 0.0–3.0)
Eosinophils Absolute: 0.1 10*3/uL (ref 0.0–0.7)
Eosinophils Relative: 1.5 % (ref 0.0–5.0)
Ferritin: 35.7 ng/mL (ref 10.0–291.0)
Folate: 17 ng/mL
Lymphocytes Relative: 29.7 % (ref 12.0–46.0)
Neutrophils Relative %: 60 % (ref 43.0–77.0)
Platelets: 295 10*3/uL (ref 150–400)
RBC: 4.64 M/uL (ref 3.87–5.11)
Saturation Ratios: 14.4 % — ABNORMAL LOW (ref 20.0–50.0)
Transferrin: 252.3 mg/dL (ref >=212.0)
WBC: 9.8 10*3/uL (ref 4.5–10.5)

## 2008-08-09 ENCOUNTER — Ambulatory Visit: Payer: Self-pay | Admitting: Gastroenterology

## 2008-08-14 ENCOUNTER — Telehealth: Payer: Self-pay | Admitting: Gastroenterology

## 2008-09-07 ENCOUNTER — Ambulatory Visit: Payer: Self-pay | Admitting: Gastroenterology

## 2008-09-08 ENCOUNTER — Encounter: Payer: Self-pay | Admitting: Gastroenterology

## 2009-01-11 ENCOUNTER — Ambulatory Visit: Payer: Self-pay | Admitting: Gastroenterology

## 2009-01-22 ENCOUNTER — Telehealth: Payer: Self-pay | Admitting: Gastroenterology

## 2009-01-22 ENCOUNTER — Telehealth (INDEPENDENT_AMBULATORY_CARE_PROVIDER_SITE_OTHER): Payer: Self-pay | Admitting: *Deleted

## 2009-02-14 ENCOUNTER — Encounter: Payer: Self-pay | Admitting: Gastroenterology

## 2009-04-04 ENCOUNTER — Ambulatory Visit: Payer: Self-pay | Admitting: Internal Medicine

## 2009-04-04 LAB — CONVERTED CEMR LAB
ALT: 27 units/L (ref 0–35)
AST: 22 units/L (ref 0–37)
Albumin: 4 g/dL (ref 3.5–5.2)
Bilirubin Urine: NEGATIVE
CO2: 30 meq/L (ref 19–32)
Calcium: 9.2 mg/dL (ref 8.4–10.5)
Creatinine, Ser: 0.6 mg/dL (ref 0.4–1.2)
Eosinophils Relative: 2.7 % (ref 0.0–5.0)
GFR calc non Af Amer: 114.69 mL/min (ref >60)
Glucose, Bld: 93 mg/dL (ref 70–99)
HCT: 40.4 % (ref 36.0–46.0)
HDL: 73.9 mg/dL (ref >39.00)
Hemoglobin: 13.8 g/dL (ref 12.0–15.0)
Ketones, ur: NEGATIVE mg/dL
Leukocytes, UA: NEGATIVE
Lymphs Abs: 2.3 10*3/uL (ref 0.7–4.0)
MCV: 92.6 fL (ref 78.0–100.0)
Monocytes Absolute: 0.5 10*3/uL (ref 0.1–1.0)
Monocytes Relative: 7.7 % (ref 3.0–12.0)
Neutro Abs: 3.5 10*3/uL (ref 1.4–7.7)
Sodium: 139 meq/L (ref 135–145)
TSH: 3.25 microintl units/mL (ref 0.35–5.50)
Total CHOL/HDL Ratio: 2
Total Protein: 7 g/dL (ref 6.0–8.3)
Triglycerides: 34 mg/dL (ref 0.0–149.0)
Urobilinogen, UA: 0.2 (ref 0.0–1.0)
WBC: 6.6 10*3/uL (ref 4.5–10.5)
pH: 6.5 (ref 5.0–8.0)

## 2009-04-12 ENCOUNTER — Ambulatory Visit: Payer: Self-pay | Admitting: Internal Medicine

## 2009-04-20 ENCOUNTER — Encounter: Payer: Self-pay | Admitting: Internal Medicine

## 2009-04-20 ENCOUNTER — Ambulatory Visit: Admission: RE | Admit: 2009-04-20 | Discharge: 2009-04-20 | Payer: Self-pay | Admitting: Internal Medicine

## 2009-04-30 ENCOUNTER — Telehealth: Payer: Self-pay | Admitting: Internal Medicine

## 2009-09-10 ENCOUNTER — Encounter: Payer: Self-pay | Admitting: Internal Medicine

## 2009-10-04 ENCOUNTER — Telehealth: Payer: Self-pay | Admitting: Gastroenterology

## 2009-10-08 ENCOUNTER — Encounter: Payer: Self-pay | Admitting: Internal Medicine

## 2010-03-04 ENCOUNTER — Ambulatory Visit: Payer: Self-pay | Admitting: Internal Medicine

## 2010-03-04 DIAGNOSIS — H6980 Other specified disorders of Eustachian tube, unspecified ear: Secondary | ICD-10-CM

## 2010-05-24 ENCOUNTER — Encounter: Admission: RE | Admit: 2010-05-24 | Discharge: 2010-05-24 | Payer: Self-pay | Admitting: Obstetrics and Gynecology

## 2010-06-03 ENCOUNTER — Ambulatory Visit: Payer: Self-pay | Admitting: Family Medicine

## 2010-06-03 DIAGNOSIS — M25539 Pain in unspecified wrist: Secondary | ICD-10-CM

## 2010-07-27 ENCOUNTER — Encounter: Payer: Self-pay | Admitting: Obstetrics and Gynecology

## 2010-08-08 NOTE — Progress Notes (Signed)
Summary: refill  Phone Note Call from Patient Call back at Home Phone [phone]   Caller: Patient Call For: Jarold Motto Reason for Call: Refill Medication, Talk to Nurse Summary of Call: Patient needs refills for her Aciphex called in to [facility] [phone] Initial call taken by: Tawni Levy,  October 04, 2009 4:39 PM  Follow-up for Phone Call        Rx sent as requested. Follow-up by: Ashok Cordia RN,  October 04, 2009 4:51 PM    New/Updated Medications: ACIPHEX 20 MG  TBEC (RABEPRAZOLE SODIUM) Take 1 each day 30 minutes before meals Prescriptions: ACIPHEX 20 MG  TBEC (RABEPRAZOLE SODIUM) Take 1 each day 30 minutes before meals  #30 x 11   Entered by:   Ashok Cordia RN   Authorized by:   Mardella Layman MD [facility]   Signed by:   Ashok Cordia RN on 10/04/2009   Method used:   Electronically to        [facility] [id number] (retail)       [address] [place], [place]  [address]       Ph: [phone]       Fax: [phone]   RxID:   [id number]

## 2010-08-08 NOTE — Assessment & Plan Note (Signed)
Summary: EAR PROBLEMS/NJR   Vital Signs:  Patient profile:   45 year old female Menstrual status:  regular Temp:     98.4 degrees F oral Pulse rate:   68 / minute Pulse rhythm:   regular Resp:     12 per minute BP sitting:   100 / 60  (left arm) Cuff size:   regular  Vitals Entered By: Gladis Riffle, RN (March 04, 2010 8:36 AM) CC: difficulty clearing left ear when dives Is Patient Diabetic? No   Primary Care Provider:  Birdie Sons, MD  CC:  difficulty clearing left ear when dives.  History of Present Illness: left ear- difficulty equlaizing pressure when diving no other complaints sxs started 2 weeks ago planing to go diving again in 2 weeks  All other systems reviewed and were negative   Preventive Screening-Counseling & Management  Alcohol-Tobacco     Alcohol drinks/day: <1     Alcohol type: wine occ.     Smoking Status: quit     Year Quit:  ?     Passive Smoke Exposure: no  Current Problems (verified): 1)  Examination, Routine Medical  (ICD-V70.0) 2)  G E R D  (ICD-530.81) 3)  Family History Diabetes 1st Degree Relative  (ICD-V18.0)  Current Medications (verified): 1)  Lidocaine-Hydrocortisone Ace 3-2.5 % Kit (Lidocaine-Hydrocortisone Ace) .... Apply Rectally Two Times A Day For 2 Weeks 2)  Aciphex 20 Mg  Tbec (Rabeprazole Sodium) .... Take 1 Each Day 30 Minutes Before Meals  Allergies (verified): No Known Drug Allergies  Past History:  Past Medical History: Last updated: 04/12/2009 Genital Warts Migraines  G E R D (ICD-530.81)  Past Surgical History: Last updated: 07/21/2008 Appendectomy - 1986 Miscarriage x3 ( maybe 5-6).    Family History: Last updated: 07/21/2008 Family History of Arthritis Family History High cholesterol Family History of Stroke  Family History Diabetes  no MI, Strokes in immediate family members     Social History: Last updated: 07/21/2008 Occupation: office mgr Married Former Smoker Alcohol use-yes Drug use-no     Regular exercise-no  Risk Factors: Alcohol Use: <1 (03/04/2010) Caffeine Use: 3 (08/08/2008) Exercise: no (02/16/2007)  Risk Factors: Smoking Status: quit (03/04/2010) Passive Smoke Exposure: no (03/04/2010)  Physical Exam  General:  Well developed, well nourished, no acute distress.healthy appearing.   Head:  normocephalic and atraumatic.   Ears:  R ear normal and L ear normal.     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 Apply rectally two times a day for 2 weeks 2)  Aciphex 20 Mg Tbec (Rabeprazole sodium) .... Take 1 each day 30 minutes before meals

## 2010-08-08 NOTE — Assessment & Plan Note (Signed)
Summary: hand issues//ccm   Vital Signs:  Patient profile:   45 year old female Menstrual status:  regular Temp:     98.0 degrees F oral BP sitting:   102 / 62  (left arm) Cuff size:   regular  Vitals Entered By: Sid Falcon LPN (June 03, 2010 4:51 PM)  History of Present Illness: Patient seen with right and forearm pain around the wrist region. No injury. Describes soreness mostly on the radial aspect of the wrist. Does a lot of typing. No recent change of activity. Pain radiates proximal toward mid forearm. No weakness. No numbness. Pain exacerbated by movement of the wrist and thumb. Has not tried icing. No alleviating factors.  Allergies (verified): No Known Drug Allergies  Past History:  Past Medical History: Last updated: 04/12/2009 Genital Warts Migraines  G E R D (ICD-530.81)  Physical Exam  General:  Well-developed,well-nourished,in no acute distress; alert,appropriate and cooperative throughout examination Extremities:  right wrist shows no visible edema or ecchymosis. No erythema or warmth. Full range of motion wrists. She has some nonspecific tenderness radial aspect of wrist near the extensor tendon of the thumb. Neurologic:  no weakness with hand grip. Upper extremity reflexes symmetric Skin:  no rashes.     Impression & Recommendations:  Problem # 1:  WRIST PAIN (ICD-719.43) suspect tendinitis. Try icing 3 times daily and samples of Pennsaid 5 drops to wrist three times a day.  Complete Medication List: 1)  Lidocaine-hydrocortisone Ace 3-2.5 % Kit (Lidocaine-hydrocortisone ace) .... Apply rectally two times a day for 2 weeks 2)  Aciphex 20 Mg Tbec (Rabeprazole sodium) .... Take 1 each day 30 minutes before meals  Patient Instructions: 1)  Pennsaid use about 5 drops to R wrist and rub into painful area three times daily. 2)  Ice R wrist 3 to 4 times daily. 3)  Be in touch in 2-3 weeks if no better.   Orders Added: 1)  Est. Patient Level III  [16109]

## 2010-08-08 NOTE — Letter (Signed)
Summary: [facility]   Imported By: Maryln Gottron 10/25/2009 12:32:24  _____________________________________________________________________  External Attachment:    Type:   Image     Comment:   External Document

## 2010-08-08 NOTE — Letter (Signed)
Summary: [facility]   Imported By: Maryln Gottron 10/03/2009 09:49:02  _____________________________________________________________________  External Attachment:    Type:   Image     Comment:   External Document

## 2010-11-22 NOTE — Op Note (Signed)
Kelly Tran, Kelly Tran                       ACCOUNT NO.:  [id number]   MEDICAL RECORD NO.:  [id number]                   PATIENT TYPE:  AMB   LOCATION:  [facility]                                 FACILITY:  [facility]   PHYSICIAN:  Malva Limes, M.D.                 DATE OF BIRTH:  Oct 24, 1965   DATE OF PROCEDURE:  07/27/2003  DATE OF DISCHARGE:                                 OPERATIVE REPORT   PREOPERATIVE DIAGNOSIS:  Missed abortion at seven weeks' estimated  gestational age.   POSTOPERATIVE DIAGNOSIS:  Missed abortion at seven weeks' estimated  gestational age.   PROCEDURE:  Dilation and curettage.   SURGEON:  Malva Limes, M.D.   ANESTHESIA:  MAC with paracervical block.   ESTIMATED BLOOD LOSS:  25 mL.   COMPLICATIONS:  None.   SPECIMENS:  Products of conception sent to pathology.   ANTIBIOTICS:  Ancef 1 g.   DESCRIPTION OF PROCEDURE:  The patient was taken to the operating room,  where she was placed in the dorsal lithotomy position.  She was prepped with  Betadine and draped in the usual fashion for this procedure.  A sterile  speculum was placed in the vagina.  Lidocaine 1% 20 mL was used for  paracervical block.  The cervical os was then dilated to 29 [place].  The 7  mm suction curette was placed into the uterine cavity and products of  conception were withdrawn.  Sharp curettage was then performed, followed by  repeat suction.  The patient was taken to the recovery room in stable  condition.  She will be discharged to home.  She will be sent home with  Darvocet and Keflex.  She will return to the office in four weeks.                                               Malva Limes, M.D.    MA/MEDQ  D:  07/27/2003  T:  07/27/2003  Job:  [id number]

## 2010-11-22 NOTE — Op Note (Signed)
[name], Kelly Tran                       ACCOUNT NO.:  [id number]   MEDICAL RECORD NO.:  [id number]                   PATIENT TYPE:  AMB   LOCATION:  [facility]                                  FACILITY:  [facility]   PHYSICIAN:  Carrington Clamp, M.D.              DATE OF BIRTH:  [birth year]   DATE OF PROCEDURE:  11/10/2003  DATE OF DISCHARGE:                                 OPERATIVE REPORT   PREOPERATIVE DIAGNOSIS:  Missed abortion at six weeks.   POSTOPERATIVE DIAGNOSIS:  Missed abortion at six weeks.   PROCEDURE:  Dilation and curettage.   ATTENDING:  Carrington Clamp, M.D.   ANESTHESIA:  General.   ESTIMATED BLOOD LOSS:  50 mL.   IV FLUIDS:  __________.   URINE OUTPUT:  Not measured.   COMPLICATIONS:  None.   FINDINGS:  A seven-week size uterus, down to six weeks and good cry postop.   MEDICATIONS:  Methergine, lidocaine with epinephrine.   COUNTS:  Correct x3.   TECHNIQUE:  After adequate anesthesia was achieved, the patient was prepped  and draped in the usual sterile fashion in the dorsal lithotomy position.  The speculum was placed in the vagina and single-tooth tenaculum placed on  the cervix to provide traction.  The bladder had been emptied prior to this.  Five milliliters of lidocaine with epinephrine had been injected on either  side of the cervix without return of blood.  The cervix was then dilated up  to allow an 8 mm curette.  The suction curettage was then performed without  complication.  Good cry was noted on sharp curettage.  All instruments  withdrawn from the vagina and the patient tolerated the procedure well, was  returned to recovery in stable condition.                                               Carrington Clamp, M.D.    MH/MEDQ  D:  11/10/2003  T:  11/11/2003  Job:  [id number]

## 2010-11-22 NOTE — Op Note (Signed)
[facility]  Patient:    Kelly Tran, Kelly Tran                    MRN: [id number] Proc. Date: 11/12/99 Adm. Date:  [id number] Attending:  Osborn Coho                           Operative Report  PREOPERATIVE DIAGNOSIS:       Intrauterine pregnancy at 12 weeks estimated gestational age.  Spontaneous abortion.  Habitual abortions.  POSTOPERATIVE DIAGNOSIS:      Intrauterine pregnancy at 12 weeks estimated gestational age.  Spontaneous abortion.  Habitual abortions.  OPERATION:                    Dilatation and curettage.  SURGEON:                      Mark E. Dareen Piano, M.D.  ASSISTANT:  ANESTHESIA:                   MAC with paracervical block.  ESTIMATED BLOOD LOSS:         50 cc.  COMPLICATIONS:                None.  SPECIMENS:                    Products of conception sent to pathology and for chromosome studies.  DESCRIPTION OF PROCEDURE:     The patient was taken to the operating room where she was placed in the dorsal lithotomy position.  She was prepped with Hibiclens.  MAC anesthesia was administered without complications.  A sterile speculum was placed in the vagina.  10 cc of 1% lidocaine was used for a paracervical block.  Single tooth tenaculum was applied to the anterior cervical lip.  The cervical s was then dilated to a 58 [place].  A 9 mm suction cannula was then placed into the uterine cavity and products of conception were withdrawn.  Sharp curettage was hen performed followed by repeat suction.  The patient tolerated the procedure well. She was transferred to the recovery room.  The patient will be discharged to home. She will follow up in the office in four weeks.  She will be sent home with Anaprox double strength p.r.n. and Keflex 500 mg q.i.d. for two days.  The patients blood type is O positive and therefore no RhoGAM is indicated. DD:  11/12/99 TD:  11/12/99 Job: [id number]

## 2011-01-13 ENCOUNTER — Telehealth (INDEPENDENT_AMBULATORY_CARE_PROVIDER_SITE_OTHER): Payer: Self-pay

## 2011-01-13 ENCOUNTER — Telehealth (INDEPENDENT_AMBULATORY_CARE_PROVIDER_SITE_OTHER): Payer: Self-pay | Admitting: General Surgery

## 2011-01-13 NOTE — Telephone Encounter (Signed)
Please call pt. She requests sooner appt with Dr Donell Beers for her hems.

## 2011-01-13 NOTE — Telephone Encounter (Signed)
pls call patient she has some questions for you

## 2011-01-14 ENCOUNTER — Telehealth (INDEPENDENT_AMBULATORY_CARE_PROVIDER_SITE_OTHER): Payer: Self-pay

## 2011-01-21 NOTE — Telephone Encounter (Signed)
Pt called c/o hemorrhoid and pain.  Advised the pt to do sitz baths as often as possible - call if worsening.

## 2011-01-27 ENCOUNTER — Encounter (INDEPENDENT_AMBULATORY_CARE_PROVIDER_SITE_OTHER): Payer: Self-pay | Admitting: General Surgery

## 2011-01-31 ENCOUNTER — Ambulatory Visit (INDEPENDENT_AMBULATORY_CARE_PROVIDER_SITE_OTHER): Payer: BC Managed Care – PPO | Admitting: General Surgery

## 2011-01-31 ENCOUNTER — Encounter (INDEPENDENT_AMBULATORY_CARE_PROVIDER_SITE_OTHER): Payer: Self-pay | Admitting: General Surgery

## 2011-01-31 VITALS — BP 118/72 | HR 53 | Temp 98.4°F | Ht 68.5 in | Wt 169.6 lb

## 2011-01-31 DIAGNOSIS — K648 Other hemorrhoids: Secondary | ICD-10-CM

## 2011-01-31 HISTORY — DX: Other hemorrhoids: K64.8

## 2011-01-31 NOTE — Progress Notes (Signed)
Kelly Tran is a 45 y.o. female.    Chief Complaint  Patient presents with  . Other    painful hems    HPI HPI Pt is starting to feel hemorrhoid flare up and come out of anus.  She is not having bleeding but has discomfort and difficulty cleaning.  She denies change in bowel habits.  She has been doing sitz baths and anusol which has helped.    History reviewed. No pertinent past medical history.  Past Surgical History  Procedure Date  . Appendectomy     History reviewed. No pertinent family history.  Social History History  Substance Use Topics  . Smoking status: Former Smoker    Quit date: 01/31/1996  . Smokeless tobacco: Not on file  . Alcohol Use: Yes    No Known Allergies  No current outpatient prescriptions on file.    Review of Systems Review of Systems  All other systems reviewed and are negative.    Physical Exam Physical Exam  Constitutional: She is oriented to person, place, and time. She appears well-developed and well-nourished.  HENT:  Head: Normocephalic.  Eyes: Pupils are equal, round, and reactive to light. No scleral icterus.  GI: Soft.  Genitourinary: Rectal exam shows internal hemorrhoid (anterior and posterior). Rectal exam shows no external hemorrhoid, no fissure, no mass, no tenderness and anal tone normal. Guaiac negative stool.  Neurological: She is alert and oriented to person, place, and time. Coordination normal.  Skin: Skin is warm and dry. No rash noted. No erythema. No pallor.  Psychiatric: She has a normal mood and affect. Her behavior is normal. Judgment and thought content normal.     Blood pressure 118/72, pulse 53, temperature 98.4 F (36.9 C), height 5' 8.5" (1.74 m), weight 169 lb 9.6 oz (76.93 kg).  Assessment/Plan Hemorrhoids, internal Hemorrhoids injected Sitz baths prn Hydrocortisone cream prn hemorrhoid flare.      Sidda Humm 01/31/2011, 10:34 AM

## 2011-01-31 NOTE — Assessment & Plan Note (Signed)
Hemorrhoids injected Sitz baths prn Hydrocortisone cream prn hemorrhoid flare.

## 2011-02-19 ENCOUNTER — Encounter (INDEPENDENT_AMBULATORY_CARE_PROVIDER_SITE_OTHER): Payer: Self-pay | Admitting: General Surgery

## 2011-03-03 ENCOUNTER — Telehealth (INDEPENDENT_AMBULATORY_CARE_PROVIDER_SITE_OTHER): Payer: Self-pay | Admitting: General Surgery

## 2011-03-07 ENCOUNTER — Encounter (INDEPENDENT_AMBULATORY_CARE_PROVIDER_SITE_OTHER): Payer: Self-pay

## 2011-03-12 ENCOUNTER — Ambulatory Visit (INDEPENDENT_AMBULATORY_CARE_PROVIDER_SITE_OTHER): Payer: BC Managed Care – PPO | Admitting: General Surgery

## 2011-03-12 ENCOUNTER — Encounter (INDEPENDENT_AMBULATORY_CARE_PROVIDER_SITE_OTHER): Payer: Self-pay | Admitting: General Surgery

## 2011-03-12 VITALS — BP 120/80 | HR 56 | Temp 97.9°F | Ht 69.0 in | Wt 174.4 lb

## 2011-03-12 DIAGNOSIS — K648 Other hemorrhoids: Secondary | ICD-10-CM

## 2011-03-12 NOTE — Progress Notes (Signed)
Subjective:     Patient ID: Kelly Tran, female   DOB: 19-Sep-1965, 45 y.o.   MRN: [id number]  HPI Pt was doing better after hemorrhoid injection around 6 weeks ago.  She had an episode of constipation for only 1 day, but experienced a flare several days later.  She did sitz baths, suppositories, and cream and they got better.  This weekend, however, she went on a long car trip and experienced severe pain and swelling.  She can feel something that goes in and out of her anus.    Review of Systems 11 pt review otherwise negative     Objective:   Physical Exam  Constitutional: She is oriented to person, place, and time. She appears well-developed and well-nourished. No distress.  HENT:  Head: Normocephalic and atraumatic.  Eyes: Pupils are equal, round, and reactive to light. No scleral icterus.  Neck: Normal range of motion.  Cardiovascular: Normal rate.   Pulmonary/Chest: Effort normal. No respiratory distress.  Abdominal: Soft.  Genitourinary: Rectal exam shows external hemorrhoid (small anterior external hemorrhoid) and internal hemorrhoid (large pedunculated anterior and posterior hemorrhoid.  Both injected.  ). Rectal exam shows no fissure, no mass, no tenderness and anal tone normal.  Neurological: She is alert and oriented to person, place, and time. Coordination normal.  Skin: Skin is dry. No rash noted. She is not diaphoretic. No erythema. No pallor.  Psychiatric: She has a normal mood and affect. Her behavior is normal. Judgment and thought content normal.       Assessment/Plan:     Hemorrhoids, internal Continues to have difficulty with anterior and posterior hemorrhoids.   Injected them again.   The two that continue to flare up have become pedunculated.  This has not been true on prior exams.   I think her symptoms are occuring once the hemorrhoids prolapse and become congested.   If she does not have improvement, I think she will need these excised in the operating  room.

## 2011-03-12 NOTE — Assessment & Plan Note (Addendum)
Continues to have difficulty with anterior and posterior hemorrhoids.   Injected them again.   The two that continue to flare up have become pedunculated.  This has not been true on prior exams.   I think her symptoms are occuring once the hemorrhoids prolapse and become congested.   If she does not have improvement, I think she will need these excised in the operating room.

## 2011-04-01 ENCOUNTER — Encounter: Payer: Self-pay | Admitting: Internal Medicine

## 2011-04-01 ENCOUNTER — Ambulatory Visit (INDEPENDENT_AMBULATORY_CARE_PROVIDER_SITE_OTHER): Payer: Self-pay | Admitting: Internal Medicine

## 2011-04-01 VITALS — BP 108/70 | Temp 98.7°F | Ht 69.0 in | Wt 174.0 lb

## 2011-04-01 DIAGNOSIS — J069 Acute upper respiratory infection, unspecified: Secondary | ICD-10-CM

## 2011-04-01 NOTE — Patient Instructions (Signed)
Get plenty of rest, Drink lots of  clear liquids, and use Tylenol or ibuprofen for fever and discomfort.    Call or return to clinic prn if these symptoms worsen or fail to improve as anticipated.  

## 2011-04-01 NOTE — Progress Notes (Signed)
  Subjective:    Patient ID: Kelly Tran, female    DOB: September 07, 1965, 45 y.o.   MRN: [id number]  HPI 45 year-old patient who is seen today for followup. She has had some mild chest congestion for the past 2 or 3 weeks and for the past 2 days dry cough. Her chief complaint is some mild shortness of breath with exertion. A couple weeks ago she had some sharp right-sided pleuritic chest pain that has resolved. She continues to be quite active with her exercise regimen and states that she now becomes a bit short of breath after a 2 mile jog rather than a 3 mile jog. Denies any fever chills or sputum production. She has had no further runs of her pain    Review of Systems  Constitutional: Positive for fatigue.  HENT: Positive for congestion. Negative for hearing loss, sore throat, rhinorrhea, dental problem, sinus pressure and tinnitus.   Eyes: Negative for pain, discharge and visual disturbance.  Respiratory: Positive for cough. Negative for shortness of breath.   Cardiovascular: Positive for chest pain. Negative for palpitations and leg swelling.  Gastrointestinal: Negative for nausea, vomiting, abdominal pain, diarrhea, constipation, blood in stool and abdominal distention.  Genitourinary: Negative for dysuria, urgency, frequency, hematuria, flank pain, vaginal bleeding, vaginal discharge, difficulty urinating, vaginal pain and pelvic pain.  Musculoskeletal: Negative for joint swelling, arthralgias and gait problem.  Skin: Negative for rash.  Neurological: Negative for dizziness, syncope, speech difficulty, weakness, numbness and headaches.  Hematological: Negative for adenopathy.  Psychiatric/Behavioral: Negative for behavioral problems, dysphoric mood and agitation. The patient is not nervous/anxious.        Objective:   Physical Exam  Constitutional: She is oriented to person, place, and time. She appears well-developed and well-nourished.  HENT:  Head: Normocephalic.  Right Ear:  External ear normal.  Left Ear: External ear normal.       Mild erythema of the oropharynx  Eyes: Conjunctivae and EOM are normal. Pupils are equal, round, and reactive to light.  Neck: Normal range of motion. Neck supple. No thyromegaly present.  Cardiovascular: Normal rate, regular rhythm, normal heart sounds and intact distal pulses.   Pulmonary/Chest: Effort normal and breath sounds normal.       O2 saturation 99 Pulse 59  Abdominal: Soft. Bowel sounds are normal. She exhibits no mass. There is no tenderness.  Musculoskeletal: Normal range of motion.  Lymphadenopathy:    She has no cervical adenopathy.  Neurological: She is alert and oriented to person, place, and time.  Skin: Skin is warm and dry. No rash noted.  Psychiatric: She has a normal mood and affect. Her behavior is normal.          Assessment & Plan:   Viral URI with mild pharyngitis and cough. Patient was reassured;  she will take Advil or Tylenol for sore throat if needed

## 2011-04-15 LAB — D-DIMER, QUANTITATIVE: D-Dimer, Quant: 0.31

## 2011-04-25 ENCOUNTER — Encounter (INDEPENDENT_AMBULATORY_CARE_PROVIDER_SITE_OTHER): Payer: BC Managed Care – PPO | Admitting: General Surgery

## 2011-05-19 ENCOUNTER — Other Ambulatory Visit: Payer: Self-pay | Admitting: Obstetrics and Gynecology

## 2011-05-26 ENCOUNTER — Ambulatory Visit (INDEPENDENT_AMBULATORY_CARE_PROVIDER_SITE_OTHER): Payer: BC Managed Care – PPO | Admitting: General Surgery

## 2011-05-26 ENCOUNTER — Encounter (INDEPENDENT_AMBULATORY_CARE_PROVIDER_SITE_OTHER): Payer: Self-pay | Admitting: General Surgery

## 2011-05-26 VITALS — BP 114/78 | HR 60 | Temp 97.7°F | Resp 18 | Ht 69.5 in | Wt 177.1 lb

## 2011-05-26 DIAGNOSIS — K648 Other hemorrhoids: Secondary | ICD-10-CM

## 2011-05-26 NOTE — Patient Instructions (Signed)
Call if symptoms worsen.

## 2011-05-26 NOTE — Progress Notes (Signed)
HISTORY: Patient is a 45 year old female seen for hemorrhoids on and off for the past few years. She has become very compliant with her stool softeners and her bowel regimen.  She still sometimes feels like sandpaper is coming out of her anus occasionally with bowel movements.  These sensations occur infrequently.  No bleeding is present.     PERTINENT REVIEW OF SYSTEMS: Otherwise negative times 11.   EXAM: Head: Normocephalic and atraumatic.  Eyes:  Conjunctivae are normal. Pupils are equal, round, and reactive to light. No scleral icterus.  Neck:  Normal range of motion. Neck supple.  Resp: No respiratory distress, normal effort. Abd:  Abdomen is soft, non distended and non tender. No masses are palpable.  There is no rebound and no guarding.  Rectal:  No external hemorrhoids on visual inspection, no masses on digital exam.  Anoscopy reveals no inflamed hemorrhoids. There are two hemorrhoids that are present and quite pedunculated.  The mucosa is no longer inflamed.  These are injected.  One is posterior and the other is anterior right.   Neurological: Alert and oriented to person, place, and time. Coordination normal.  Skin: Skin is warm and dry. No rash noted. No diaphoretic. No erythema. No pallor.  Psychiatric: Normal mood and affect. Normal behavior. Judgment and thought content normal.     ASSESSMENT AND PLAN:   Hemorrhoids, internal Two pedunculated hemorrhoids are present that are likely causing her symptoms when they prolapse.   They are no longer inflamed, but are on quite long stalks.   The base of the stalk is just above the dentate line, and I don't think she would tolerate banding.   These were injected.   If they do not improve, or if they worsen, they will need to be excised in the operating room.   The patient was advised of recovery time (around 1-2 weeks), and the expense and discomfort of surgery.  She will wait until the symptoms worsen to pursue that option.          Maudry Diego, MD Surgical Oncology, General & Endocrine Surgery [facility]  Judie Petit, MD, MD S[facility], Laruth Bouchard, MD

## 2011-05-26 NOTE — Assessment & Plan Note (Signed)
Two pedunculated hemorrhoids are present that are likely causing her symptoms when they prolapse.   They are no longer inflamed, but are on quite long stalks.   The base of the stalk is just above the dentate line, and I don't think she would tolerate banding.   These were injected.   If they do not improve, or if they worsen, they will need to be excised in the operating room.   The patient was advised of recovery time (around 1-2 weeks), and the expense and discomfort of surgery.  She will wait until the symptoms worsen to pursue that option.

## 2011-08-27 ENCOUNTER — Telehealth: Payer: Self-pay | Admitting: Family Medicine

## 2011-08-27 NOTE — Telephone Encounter (Signed)
Will discuss at OV We can draw all labs at that time

## 2011-08-27 NOTE — Telephone Encounter (Signed)
LMTCB

## 2011-08-27 NOTE — Telephone Encounter (Signed)
Pt is having cpx labs on 3/18. She wants hormone levels checked as well. Thanks.

## 2011-08-28 NOTE — Telephone Encounter (Signed)
LMTCB again

## 2011-08-29 NOTE — Telephone Encounter (Signed)
Notified pt. 

## 2011-09-19 ENCOUNTER — Telehealth: Payer: Self-pay | Admitting: *Deleted

## 2011-09-19 NOTE — Telephone Encounter (Signed)
Flexeril 10 mg #30 one 3 times a day

## 2011-09-19 NOTE — Telephone Encounter (Signed)
Has history of muscle spasms in neck, and just had a really bad one.  Would like to get her Flexeril refilled if possible.

## 2011-09-22 ENCOUNTER — Other Ambulatory Visit (INDEPENDENT_AMBULATORY_CARE_PROVIDER_SITE_OTHER): Payer: 59

## 2011-09-22 DIAGNOSIS — N951 Menopausal and female climacteric states: Secondary | ICD-10-CM

## 2011-09-22 DIAGNOSIS — Z0000 Encounter for general adult medical examination without abnormal findings: Secondary | ICD-10-CM

## 2011-09-22 LAB — CBC WITH DIFFERENTIAL/PLATELET
Basophils Relative: 1.2 % (ref 0.0–3.0)
Eosinophils Absolute: 0.2 10*3/uL (ref 0.0–0.7)
HCT: 38.6 % (ref 36.0–46.0)
Hemoglobin: 12.9 g/dL (ref 12.0–15.0)
Lymphocytes Relative: 41.1 % (ref 12.0–46.0)
MCHC: 33.5 g/dL (ref 30.0–36.0)
Monocytes Relative: 7.1 % (ref 3.0–12.0)
Neutro Abs: 2.3 10*3/uL (ref 1.4–7.7)
RBC: 4.17 Mil/uL (ref 3.87–5.11)

## 2011-09-22 LAB — BASIC METABOLIC PANEL
CO2: 29 mEq/L (ref 19–32)
Calcium: 9.3 mg/dL (ref 8.4–10.5)
Potassium: 4.2 mEq/L (ref 3.5–5.1)
Sodium: 141 mEq/L (ref 135–145)

## 2011-09-22 LAB — POCT URINALYSIS DIPSTICK
Bilirubin, UA: NEGATIVE
Glucose, UA: NEGATIVE
Leukocytes, UA: NEGATIVE
Nitrite, UA: NEGATIVE

## 2011-09-22 LAB — HEPATIC FUNCTION PANEL
AST: 22 U/L (ref 0–37)
Albumin: 3.9 g/dL (ref 3.5–5.2)
Alkaline Phosphatase: 63 U/L (ref 39–117)
Total Protein: 6.7 g/dL (ref 6.0–8.3)

## 2011-09-22 LAB — LIPID PANEL: Total CHOL/HDL Ratio: 2

## 2011-09-22 LAB — FOLLICLE STIMULATING HORMONE: FSH: 90.6 m[IU]/mL

## 2011-09-22 MED ORDER — CYCLOBENZAPRINE HCL 10 MG PO TABS: 10.0000 mg | ORAL_TABLET | Freq: Three times a day (TID) | ORAL | Status: AC | PRN

## 2011-09-23 LAB — LUTEINIZING HORMONE: LH: 32.85 m[IU]/mL

## 2011-09-29 ENCOUNTER — Ambulatory Visit (INDEPENDENT_AMBULATORY_CARE_PROVIDER_SITE_OTHER): Payer: 59 | Admitting: Internal Medicine

## 2011-09-29 ENCOUNTER — Encounter: Payer: Self-pay | Admitting: Internal Medicine

## 2011-09-29 VITALS — BP 110/74 | HR 76 | Temp 98.3°F | Resp 16

## 2011-09-29 DIAGNOSIS — Z0000 Encounter for general adult medical examination without abnormal findings: Secondary | ICD-10-CM

## 2011-09-29 NOTE — Progress Notes (Signed)
Patient ID: Kelly Tran, female   DOB: Jul 22, 1965, 46 y.o.   MRN: [id number] cpx  Past Medical History  Diagnosis Date  . G E R D 02/22/2007  . Hemorrhoids, internal 01/31/2011  . Migraine   . Genital warts     History   Social History  . Marital Status: Married    Spouse Name: N/A    Number of Children: N/A  . Years of Education: N/A   Occupational History  . Not on file.   Social History Main Topics  . Smoking status: Former Smoker    Quit date: 01/31/1996  . Smokeless tobacco: Never Used  . Alcohol Use: Yes  . Drug Use: No  . Sexually Active: Not on file   Other Topics Concern  . Not on file   Social History Narrative  . No narrative on file    Past Surgical History  Procedure Date  . Appendectomy   . Miscarriage     x3--required d and c    Family History  Problem Relation Age of Onset  . Arthritis Neg Hx     family  . Hyperlipidemia Neg Hx     family  . Diabetes Neg Hx     family  . Stroke Mother   . Hypertension Father     No Known Allergies  Current Outpatient Prescriptions on File Prior to Visit  Medication Sig Dispense Refill  . cyclobenzaprine (FLEXERIL) 10 MG tablet Take 1 tablet (10 mg total) by mouth 3 (three) times daily as needed for muscle spasms.  30 tablet  0     patient denies chest pain, shortness of breath, orthopnea. Denies lower extremity edema, abdominal pain, change in appetite, change in bowel movements. Patient denies rashes, musculoskeletal complaints. No other specific complaints in a complete review of systems.   There were no vitals taken for this visit.  Well-developed well-nourished female in no acute distress. HEENT exam atraumatic, normocephalic, extraocular muscles are intact. Neck is supple. No jugular venous distention no thyromegaly. Chest clear to auscultation without increased work of breathing. Cardiac exam S1 and S2 are regular. Abdominal exam active bowel sounds, soft, nontender. Extremities no edema.  Neurologic exam she is alert without any motor sensory deficits. Gait is normal.  A/P- Well visit- health maint UTD

## 2012-01-27 ENCOUNTER — Telehealth: Payer: Self-pay | Admitting: Internal Medicine

## 2012-01-27 NOTE — Telephone Encounter (Signed)
Left message for pt to call back  °

## 2012-01-27 NOTE — Telephone Encounter (Signed)
Kelly Tran from [place] will fax labs to Dr Cato Mulligan

## 2012-01-27 NOTE — Telephone Encounter (Signed)
Pt has rsc her appt to 01/29/12 since pcp is out of office on 02/03/12. Pt is req that nurse call Dr Malva Limes at [facility] to get a copy of pts lab results sent over to Dr Cato Mulligan for ov on 01/29/12.

## 2012-01-29 ENCOUNTER — Ambulatory Visit (INDEPENDENT_AMBULATORY_CARE_PROVIDER_SITE_OTHER): Payer: 59 | Admitting: Internal Medicine

## 2012-01-29 ENCOUNTER — Encounter: Payer: Self-pay | Admitting: Internal Medicine

## 2012-01-29 VITALS — BP 122/94 | HR 88 | Temp 98.6°F | Wt 182.0 lb

## 2012-01-29 DIAGNOSIS — F39 Unspecified mood [affective] disorder: Secondary | ICD-10-CM | POA: Insufficient documentation

## 2012-01-29 MED ORDER — SERTRALINE HCL 50 MG PO TABS: 50.0000 mg | ORAL_TABLET | Freq: Every day | ORAL | Status: DC

## 2012-01-29 NOTE — Progress Notes (Signed)
Patient ID: Kelly Tran, female   DOB: 10-Aug-1965, 46 y.o.   MRN: [id number]  Irregular periods, hair loss, fatigue. She has seen Dr. Dareen Piano- TSH was minimally elevated, T4 was0.76 (normal 0.9-1.8). Started levothyroxine and repeat labs have normalized.  She complains of being tired all of the time. Says she wants to sleep all of the time.   Past Medical History  Diagnosis Date  . G E R D 02/22/2007  . Hemorrhoids, internal 01/31/2011  . Migraine   . Genital warts     History   Social History  . Marital Status: Married    Spouse Name: N/A    Number of Children: N/A  . Years of Education: N/A   Occupational History  . Not on file.   Social History Main Topics  . Smoking status: Former Smoker    Quit date: 01/31/1996  . Smokeless tobacco: Never Used  . Alcohol Use: Yes  . Drug Use: No  . Sexually Active: Not on file   Other Topics Concern  . Not on file   Social History Narrative  . No narrative on file    Past Surgical History  Procedure Date  . Appendectomy   . Miscarriage     x3--required d and c    Family History  Problem Relation Age of Onset  . Arthritis Neg Hx     family  . Hyperlipidemia Neg Hx     family  . Diabetes Neg Hx     family  . Stroke Mother   . Hypertension Father     No Known Allergies  Current Outpatient Prescriptions on File Prior to Visit  Medication Sig Dispense Refill  . aspirin 81 MG tablet Take 81 mg by mouth daily.      . Calcium Carbonate-Vitamin D (CALCIUM 500 + D PO) Take 1 tablet by mouth daily.      . Cholecalciferol (VITAMIN D) 2000 UNITS CAPS Take by mouth daily.      Marland Kitchen levothyroxine (SYNTHROID, LEVOTHROID) 50 MCG tablet Take 50 mcg by mouth daily.      . Multiple Vitamin (MULTIVITAMIN) tablet Take 1 tablet by mouth daily.      . sertraline (ZOLOFT) 50 MG tablet Take 1 tablet (50 mg total) by mouth daily.  90 tablet  3     patient denies chest pain, shortness of breath, orthopnea. Denies lower extremity edema,  abdominal pain, change in appetite, change in bowel movements. Patient denies rashes, musculoskeletal complaints. No other specific complaints in a complete review of systems.   BP 122/94  Pulse 88  Temp 98.6 F (37 C) (Oral)  Wt 182 lb (82.555 kg)  Well-developed well-nourished female in no acute distress. HEENT exam atraumatic, normocephalic, extraocular muscles are intact. Neck is supple. No jugular venous distention no thyromegaly. Chest clear to auscultation without increased work of breathing. Flat affect

## 2012-01-30 ENCOUNTER — Telehealth: Payer: Self-pay | Admitting: Internal Medicine

## 2012-01-30 NOTE — Telephone Encounter (Signed)
Left message for pt to call back  °

## 2012-01-30 NOTE — Telephone Encounter (Signed)
Ok. Take 1/2 for one week and then increase to one daily

## 2012-01-30 NOTE — Telephone Encounter (Signed)
Caller: Kelly Tran/Patient is calling with a question about Generic Zoloft, Can Take Just Half?Marland KitchenThe medication was written by Birdie Sons.  States the medication makes her feel jittery; just started the medication 01/29/12.  Would like to start with half a tablet if that's ok with Dr. Cato Mulligan, then "work her way up to a full tab."  Rx was for zoloft 50mg  qd.  Info to office for provider review/callback. MAY REACH PATIENT AT [phone].

## 2012-02-02 NOTE — Telephone Encounter (Signed)
Pt aware.

## 2012-02-03 ENCOUNTER — Ambulatory Visit: Payer: 59 | Admitting: Internal Medicine

## 2012-02-10 ENCOUNTER — Telehealth: Payer: Self-pay | Admitting: Internal Medicine

## 2012-02-10 NOTE — Telephone Encounter (Signed)
Pt is having issues with her Zoloft pt states that she is only taking half of what she was prescribed and it is causing her to be jittery and unable to sleep. Pt is also experiencing a tingling sensation. Pt would like to try a different med to help calm her nerves

## 2012-02-11 ENCOUNTER — Telehealth: Payer: Self-pay | Admitting: Family Medicine

## 2012-02-11 NOTE — Telephone Encounter (Signed)
Pt wants to know what the difference is between paroxetine and amitriptyline.  Her sister takes the amitriptyline and says it works good.  Pt is going through a divorce and needs something fast acting.

## 2012-02-11 NOTE — Telephone Encounter (Signed)
Patient called back to request that the nurse give her a call at [phone]. Please assist.

## 2012-02-11 NOTE — Telephone Encounter (Signed)
[facility] Triage Call Report Triage Record Num: [id number] Operator: Aundra Millet Patient Name: Kelly Tran Call Date & Time: 02/10/2012 5:58:36PM Patient Phone: [phone] PCP: Valetta Mole. [facility] Patient Gender: Female PCP Fax : [phone] Patient DOB: 1966/04/17 [facility] Name[facility] Reason for Call: Caller: Shaynna/Patient; PCP: Birdie Sons; CB#: [phone]; Call regarding Anxious ; Today, 02/10/2012 , pt calling wanting to know if Dr Cato Mulligan will change her rx. On 01/30/2012 she started taking Zoloft 50 mg at bedtime, and took that for 2 days for depression. On 02/02/2012 Dr Cato Mulligan decreased to 1/2 tablet because she was feeling jittery. She is still feeling jittery and anxious and having trouble sleeping. RN reached Call Provider within 24 hrs for new symptoms and taking medications as prescribed per Anxiety protocol - RN discussed care advice and advised to call office tomorrow with her concerns. Protocol(s) Used: Anxiety Recommended Outcome per Protocol: Call Provider within 24 Hours Reason for Outcome: New or increasing symptoms AND taking medications/following therapy as prescribed Care Advice: ~ Identify factors that make symptoms worse or more frequent. Continue to follow your treatment plan. Take all medications as prescribed and keep all appointments with your provider. ~ Avoid the use of stimulants including caffeine (coffee, some soft drinks, some energy drinks, tea and chocolate), cocaine, and amphetamines. Also avoid drinking alcohol. ~ Exercise, yoga, massage, relaxation, prayer, music, a hot bath, or journal writing are all ways that may help calm a person. Try these and find one or more that helps to relieve your anxiety and practice it when feeling stressed or worried. ~ ~ Eat a balanced diet and follow a regular sleep schedule with adequate sleep, about 7 to 8 hours a night. ~ SYMPTOM / CONDITION MANAGEMENT 08/

## 2012-02-11 NOTE — Telephone Encounter (Signed)
D/c zoloft Trial paroxetine 20 mg po qd, #30/6 refills

## 2012-02-13 MED ORDER — AMITRIPTYLINE HCL 25 MG PO TABS: 25.0000 mg | ORAL_TABLET | Freq: Every day | ORAL | Status: DC

## 2012-02-13 NOTE — Telephone Encounter (Signed)
rx sent in electronically, pt aware 

## 2012-02-13 NOTE — Telephone Encounter (Signed)
Peroxidases a long-acting antianxiety and antidepressant medication. It is okay with me if she would like to try amitriptyline. Discontinue piroxicam for the medication list. It was never started.: Amitriptyline 25 mg by mouth each bedtime when necessary insomnia, #30/3 refills.

## 2012-02-13 NOTE — Addendum Note (Signed)
Addended by: Alfred Levins D on: 02/13/2012 01:50 PM   Modules accepted: Orders

## 2012-02-13 NOTE — Telephone Encounter (Signed)
Clarification of the previous note. paroxetine is a long acting antianxiety medication and antidepressant medication. Discontinue paroxetine from the medication list. Amitriptyline as indicated in previous note.

## 2012-03-12 ENCOUNTER — Ambulatory Visit: Payer: 59 | Admitting: Internal Medicine

## 2012-03-19 ENCOUNTER — Ambulatory Visit (INDEPENDENT_AMBULATORY_CARE_PROVIDER_SITE_OTHER): Payer: 59 | Admitting: Internal Medicine

## 2012-03-19 ENCOUNTER — Encounter: Payer: Self-pay | Admitting: Internal Medicine

## 2012-03-19 VITALS — BP 122/82 | HR 76 | Temp 98.6°F | Wt 185.0 lb

## 2012-03-19 DIAGNOSIS — F39 Unspecified mood [affective] disorder: Secondary | ICD-10-CM

## 2012-03-19 MED ORDER — LEVOTHYROXINE SODIUM 50 MCG PO TABS: 50.0000 ug | ORAL_TABLET | Freq: Every day | ORAL | Status: DC

## 2012-03-19 MED ORDER — AMITRIPTYLINE HCL 25 MG PO TABS: 25.0000 mg | ORAL_TABLET | Freq: Every day | ORAL | Status: DC

## 2012-03-19 NOTE — Progress Notes (Signed)
Patient ID: Kelly Tran, female   DOB: March 31, 1966, 46 y.o.   MRN: [id number] Insomnia and mood-- She is doing well on low dose elavil  Reviewed PMH/SH  Exam- normal affect

## 2012-03-19 NOTE — Assessment & Plan Note (Signed)
Better controlled just on low dose elavil for sleep Continue same

## 2012-05-11 ENCOUNTER — Ambulatory Visit (INDEPENDENT_AMBULATORY_CARE_PROVIDER_SITE_OTHER): Payer: 59 | Admitting: General Surgery

## 2012-05-11 ENCOUNTER — Encounter (INDEPENDENT_AMBULATORY_CARE_PROVIDER_SITE_OTHER): Payer: Self-pay | Admitting: General Surgery

## 2012-05-11 VITALS — BP 132/76 | HR 80 | Temp 98.0°F | Resp 18 | Ht 69.5 in | Wt 186.4 lb

## 2012-05-11 DIAGNOSIS — K648 Other hemorrhoids: Secondary | ICD-10-CM

## 2012-05-11 NOTE — Assessment & Plan Note (Signed)
Patient has multiple internal hemorrhoids, one of which prolapsed and appeared to have thrombosed earlier.  Internal hemorrhoids were injected circumferentially.  Good bowel habits were reviewed with the patient.  Patient can return on an as-needed basis. The hemorrhoid that appeared to prolapse was fairly pedunculated.  I think this would be amenable to surgery if we get to that point.

## 2012-05-11 NOTE — Patient Instructions (Signed)
Sitz baths as needed.  Stool softeners twice daily (colace or senna)  Hydrocortisone cream to perianal skin as needed.

## 2012-05-11 NOTE — Progress Notes (Signed)
HISTORY: Patient is a 47 year old female seen for hemorrhoids on and off for the past few years. She remains very compliant with her stool softeners and her bowel regimen.  She has been having problems recently for around 2 weeks. She has some soreness and a sense of something very large prolapsing out of her anus. She has had some intermittent bleeding. She did get the hydrocortisone cream refilled and that helped with some of the symptoms. She is here to pursue injection.  PERTINENT REVIEW OF SYSTEMS: Otherwise negative times 11.   EXAM: Head: Normocephalic and atraumatic.  Eyes:  Conjunctivae are normal. Pupils are equal, round, and reactive to light. No scleral icterus.  Neck:  Normal range of motion. Neck supple.  Resp: No respiratory distress, normal effort. Abd:  Abdomen is soft, non distended and non tender. No masses are palpable.  There is no rebound and no guarding.  Rectal:  No external hemorrhoids on visual inspection. There is one internal hemorrhoid that is small, but prolapsing through the anus with signs of recent thrombosis.  It is no longer firm.  On anoscopy, there are circumferential internal hemorrhoids.  Two are pedunculated.  The prolapsing one was located anteriorly.    Neurological: Alert and oriented to person, place, and time. Coordination normal.  Skin: Skin is warm and dry. No rash noted. No diaphoretic. No erythema. No pallor.  Psychiatric: Normal mood and affect. Normal behavior. Judgment and thought content normal.     ASSESSMENT AND PLAN:   Hemorrhoids, internal, with bleeding Patient has multiple internal hemorrhoids, one of which prolapsed and appeared to have thrombosed earlier.  Internal hemorrhoids were injected circumferentially.  Good bowel habits were reviewed with the patient.  Patient can return on an as-needed basis. The hemorrhoid that appeared to prolapse was fairly pedunculated.  I think this would be amenable to surgery if we get to that  point.       Maudry Diego, MD Surgical Oncology, General & Endocrine Surgery [facility]  Judie Petit, MD [facility], Valetta Mole, MD

## 2012-05-19 ENCOUNTER — Encounter (INDEPENDENT_AMBULATORY_CARE_PROVIDER_SITE_OTHER): Payer: Self-pay | Admitting: General Surgery

## 2012-05-19 ENCOUNTER — Ambulatory Visit (INDEPENDENT_AMBULATORY_CARE_PROVIDER_SITE_OTHER): Payer: 59 | Admitting: General Surgery

## 2012-05-19 VITALS — BP 124/80 | HR 102 | Temp 97.3°F | Resp 18 | Ht 69.0 in | Wt 188.4 lb

## 2012-05-19 DIAGNOSIS — K648 Other hemorrhoids: Secondary | ICD-10-CM

## 2012-05-19 MED ORDER — HYDROCORTISONE 2.5 % RE CREA: TOPICAL_CREAM | Freq: Two times a day (BID) | RECTAL | Status: DC

## 2012-05-19 NOTE — Patient Instructions (Addendum)
Continue using good bowel habits.  Follow up in 4 weeks   Let us know if any significant bleeding occurs.

## 2012-05-19 NOTE — Assessment & Plan Note (Signed)
Right sided hemorrhoid pedunculated.  Banded.    Other hemorrhoids injected circumferentially.  Follow up in 4 weeks.

## 2012-05-19 NOTE — Progress Notes (Signed)
HISTORY: Patient is a 46 year old female seen for hemorrhoids on and off for the past few years. I saw her last week and injected multiple hemorrhoids.  She continues to have difficulty with sensation of prolapse and discomfort.  She denies significant bleeding.     PERTINENT REVIEW OF SYSTEMS: Otherwise negative times 11.   EXAM: Head: Normocephalic and atraumatic.  Eyes:  Conjunctivae are normal. Pupils are equal, round, and reactive to light. No scleral icterus.  Neck:  Normal range of motion. Neck supple.  Resp: No respiratory distress, normal effort.  Rectal:  There is a tiny external hemorrhoid with a small clot in a portion of it.  On anoscopy, there are circumferential internal hemorrhoids. They look better overall except for right one, which appears more pedunculated.  This one is banded.  Neurological: Alert and oriented to person, place, and time. Coordination normal.  Skin: Skin is warm and dry. No rash noted. No diaphoretic. No erythema. No pallor.  Psychiatric: Normal mood and affect. Normal behavior. Judgment and thought content normal.     ASSESSMENT AND PLAN:   Hemorrhoids, internal, with bleeding Right sided hemorrhoid pedunculated.  Banded.    Other hemorrhoids injected circumferentially.  Follow up in 4 weeks.         Maudry Diego, MD Surgical Oncology, General & Endocrine Surgery [facility]  Judie Petit, MD [facility], Valetta Mole, MD

## 2012-06-09 ENCOUNTER — Telehealth: Payer: Self-pay | Admitting: Internal Medicine

## 2012-06-09 NOTE — Telephone Encounter (Signed)
Pt called and said that she is having problems with thyroid and is needing a referral to a really good Endocrinologist. Pt said that she is willing to go to outside of [facility] for the referral. Pls call.

## 2012-06-10 ENCOUNTER — Other Ambulatory Visit (INDEPENDENT_AMBULATORY_CARE_PROVIDER_SITE_OTHER): Payer: BC Managed Care – PPO

## 2012-06-10 DIAGNOSIS — E039 Hypothyroidism, unspecified: Secondary | ICD-10-CM

## 2012-06-10 NOTE — Telephone Encounter (Signed)
Pt aware, appt scheduled. °

## 2012-06-10 NOTE — Telephone Encounter (Signed)
Check tsh and FT4 i will make referral after lab work

## 2012-06-17 ENCOUNTER — Telehealth: Payer: Self-pay | Admitting: Internal Medicine

## 2012-06-17 NOTE — Telephone Encounter (Signed)
She can call and make an appointment- ([facility]???)  I don't have anything for which to make a referral

## 2012-06-17 NOTE — Telephone Encounter (Signed)
Gave pt normal thyroid results and she said she would still like a referral to an endocrinologist and because she feels something is not right.  Please advise.

## 2012-06-17 NOTE — Telephone Encounter (Signed)
Pt aware, she will call [facility]

## 2012-06-17 NOTE — Telephone Encounter (Signed)
Pt calling - had thyroid labs 12/5 and is frustrated because she has not heard from anyone. Please cal with results ASAP.

## 2012-06-21 ENCOUNTER — Encounter (INDEPENDENT_AMBULATORY_CARE_PROVIDER_SITE_OTHER): Payer: 59 | Admitting: General Surgery

## 2012-07-13 ENCOUNTER — Encounter (INDEPENDENT_AMBULATORY_CARE_PROVIDER_SITE_OTHER): Payer: Self-pay | Admitting: General Surgery

## 2012-07-13 ENCOUNTER — Ambulatory Visit (INDEPENDENT_AMBULATORY_CARE_PROVIDER_SITE_OTHER): Payer: BC Managed Care – PPO | Admitting: General Surgery

## 2012-07-13 VITALS — BP 120/80 | HR 84 | Temp 98.5°F | Resp 20 | Ht 69.5 in | Wt 190.4 lb

## 2012-07-13 DIAGNOSIS — K648 Other hemorrhoids: Secondary | ICD-10-CM

## 2012-07-13 NOTE — Patient Instructions (Signed)
Continue miralax.    Follow up in 4-6 weeks.

## 2012-07-13 NOTE — Progress Notes (Signed)
HISTORY: Patient is a 47 year old female seen for hemorrhoids on and off for the past few years. I saw her and injected multiple hemorrhoids several months ago.  At the last visit, i banded a right sided hemorrhoid.  She had pain for around 1 week, but then symptoms were better for a while.  However, after christmas, she had a day of severe constipation despite daily miralax and no change in water/ fiber intake.  After that, she developed a significant sensation of pressure.     PERTINENT REVIEW OF SYSTEMS: Otherwise negative times 11.   EXAM: Head: Normocephalic and atraumatic.  Eyes:  Conjunctivae are normal. Pupils are equal, round, and reactive to light. No scleral icterus.  Neck:  Normal range of motion. Neck supple.  Resp: No respiratory distress, normal effort.  Rectal:  Right sided pedunculated area better, but flat hemorrhoid still present. This was area of previous banding.  Anterior left had small pedunculated hemorrhoid, and left posterior demonstrated larger pedunculated hemorrhoid.  These were injected circumferentially.   Neurological: Alert and oriented to person, place, and time. Coordination normal.  Skin: Skin is warm and dry. No rash noted. No diaphoretic. No erythema. No pallor.  Psychiatric: Normal mood and affect. Normal behavior. Judgment and thought content normal.     ASSESSMENT AND PLAN:   Hemorrhoids, internal, with bleeding Injected internal hemorrhoids circumferentially.    The posterior left side is pedunculated now.    Will band if no improvement.  Follow up in 4-6 weeks. Continue miralax.   May benefit from surgery if symptoms do not improve.          Maudry Diego, MD Surgical Oncology, General & Endocrine Surgery [facility]  Judie Petit, MD Swords, Valetta Mole, MD

## 2012-07-13 NOTE — Assessment & Plan Note (Addendum)
Injected internal hemorrhoids circumferentially.    The posterior left side is pedunculated now.    Will band if no improvement.  Follow up in 4-6 weeks. Continue miralax.   May benefit from surgery if symptoms do not improve.

## 2012-08-16 ENCOUNTER — Ambulatory Visit (INDEPENDENT_AMBULATORY_CARE_PROVIDER_SITE_OTHER): Payer: BC Managed Care – PPO | Admitting: General Surgery

## 2012-08-16 ENCOUNTER — Encounter (INDEPENDENT_AMBULATORY_CARE_PROVIDER_SITE_OTHER): Payer: Self-pay | Admitting: General Surgery

## 2012-08-16 VITALS — BP 118/76 | HR 75 | Temp 97.5°F | Resp 18 | Ht 69.5 in | Wt 192.4 lb

## 2012-08-16 DIAGNOSIS — K648 Other hemorrhoids: Secondary | ICD-10-CM

## 2012-08-16 NOTE — Progress Notes (Signed)
HISTORY: Patient is a 47 year old female seen for hemorrhoids on and off for the past few years. I injected two pedunculated hemorrhoids at last visit.  She is now doing much better.  She is not having any pressure or bleeding.  She no longer has the symptoms of prolapse.     PERTINENT REVIEW OF SYSTEMS: Otherwise negative times 11.   EXAM: Head: Normocephalic and atraumatic.  Eyes:  Conjunctivae are normal. Pupils are equal, round, and reactive to light. No scleral icterus.  Neck:  Normal range of motion. Neck supple.  Resp: No respiratory distress, normal effort.  Rectal:  Not performed today.   Neurological: Alert and oriented to person, place, and time. Coordination normal.  Skin: Skin is warm and dry. No rash noted. No diaphoretic. No erythema. No pallor.  Psychiatric: Normal mood and affect. Normal behavior. Judgment and thought content normal.     ASSESSMENT AND PLAN:   Hemorrhoids, internal, with bleeding Pt doing well.  Did not reexamine anus in order to avoid trauma to hemorrhoids.  Advised to continue aggressive avoidance of constipation.   She is on daily miralax and as needed stool softeners.         Maudry Diego, MD Surgical Oncology, General & Endocrine Surgery [facility]  Judie Petit, MD Swords, Valetta Mole, MD

## 2012-08-16 NOTE — Patient Instructions (Signed)
Continue daily miralax and as needed stool softeners.    If you get symptoms, immediately start hemorrhoid cream.  Follow up as needed.

## 2012-08-16 NOTE — Assessment & Plan Note (Signed)
Pt doing well.  Did not reexamine in order to avoid trauma to hemorrhoids.  Advised to continue aggressive avoidance of constipation.   She is on daily miralax and as needed stool softeners.

## 2012-09-22 ENCOUNTER — Encounter: Payer: Self-pay | Admitting: Family Medicine

## 2012-09-22 ENCOUNTER — Ambulatory Visit (INDEPENDENT_AMBULATORY_CARE_PROVIDER_SITE_OTHER): Payer: BC Managed Care – PPO | Admitting: Family Medicine

## 2012-09-22 VITALS — BP 110/70 | HR 107 | Temp 98.7°F | Wt 191.0 lb

## 2012-09-22 DIAGNOSIS — H8110 Benign paroxysmal vertigo, unspecified ear: Secondary | ICD-10-CM

## 2012-09-22 MED ORDER — METHYLPREDNISOLONE ACETATE 80 MG/ML IJ SUSP
120.0000 mg | Freq: Once | INTRAMUSCULAR | Status: AC
  Administered 2012-09-22: 120 mg via INTRAMUSCULAR

## 2012-09-22 NOTE — Progress Notes (Signed)
  Subjective:    Patient ID: Kelly Tran, female    DOB: [birth year], 47 y.o.   MRN: [id number]  HPI Here for intermittent dizziness for 2 weeks and now pain in the left ear for 2 days. No HA or fever or ST or cough. The dizziness comes when she moves quickly or turns her head.    Review of Systems  Constitutional: Negative.   HENT: Positive for ear pain and congestion. Negative for hearing loss, rhinorrhea, sneezing, postnasal drip and tinnitus.   Eyes: Negative.   Respiratory: Negative.        Objective:   Physical Exam  Constitutional: She appears well-developed and well-nourished. No distress.  HENT:  Head: Normocephalic and atraumatic.  Right Ear: External ear normal.  Left Ear: External ear normal.  Nose: Nose normal.  Mouth/Throat: Oropharynx is clear and moist.  Eyes: Conjunctivae and EOM are normal. Pupils are equal, round, and reactive to light.  Neck: No thyromegaly present.  Pulmonary/Chest: Effort normal and breath sounds normal.  Lymphadenopathy:    She has no cervical adenopathy.          Assessment & Plan:  Given a steroid shot. She will start on Allegra D for awhile

## 2012-09-22 NOTE — Addendum Note (Signed)
Addended by: Aniceto Boss A on: 09/22/2012 04:48 PM   Modules accepted: Orders

## 2013-01-18 ENCOUNTER — Encounter (INDEPENDENT_AMBULATORY_CARE_PROVIDER_SITE_OTHER): Payer: Self-pay | Admitting: General Surgery

## 2013-01-18 ENCOUNTER — Ambulatory Visit (INDEPENDENT_AMBULATORY_CARE_PROVIDER_SITE_OTHER): Payer: BC Managed Care – PPO | Admitting: General Surgery

## 2013-01-18 VITALS — BP 110/72 | HR 100 | Resp 20 | Ht 69.5 in | Wt 180.0 lb

## 2013-01-18 DIAGNOSIS — K648 Other hemorrhoids: Secondary | ICD-10-CM

## 2013-01-18 NOTE — Assessment & Plan Note (Signed)
Pt continues to have the same two pedunculated internal hemorrhoids.  I have tried to band in the past, but when i grasp the hemorrhoids, she is too sensitive to place bands. I re-injected them.  I have recommended removal surgically if these continue to bother her.  She has required many injections of these in the past.

## 2013-01-18 NOTE — Patient Instructions (Signed)
Continue avoiding constipation.  Follow up in 8 weeks.

## 2013-01-18 NOTE — Progress Notes (Signed)
HISTORY: Patient is a 47 year old female with a long history of hemorrhoids. She has been very aggressive with avoiding constipation. She takes stool softeners and MiraLAX daily. She has not started having any bleeding, burning, or itching. She has however developed prolapsed again of few small hemorrhoids. She feels them come out of her bottom when she has a bowel movement and has to push them back in.she has not used any hemorrhoid cream recently.   PERTINENT REVIEW OF SYSTEMS: Otherwise negative.    Filed Vitals:   01/18/13 1059  BP: 110/72  Pulse: 100  Resp: 20   Filed Weights   01/18/13 1059  Weight: 180 lb (81.647 kg)    EXAM: Head: Normocephalic and atraumatic.  Eyes:  Conjunctivae are normal. Pupils are equal, round, and reactive to light. No scleral icterus.  Resp: No respiratory distress, normal effort. Neurological: Alert and oriented to person, place, and time. Coordination normal.  Rectal:  No evidence of external hemorrhoids.  + two internal hemorrhoids, one posterior and one anterior.  Pedunculated.  Both injected.   Skin: Skin is warm and dry. No rash noted. No diaphoretic. No erythema. No pallor.  Psychiatric: Normal mood and affect. Normal behavior. Judgment and thought content normal.      ASSESSMENT AND PLAN:   Hemorrhoids, internal, with prolapse Pt continues to have the same two pedunculated internal hemorrhoids.  I have tried to band in the past, but when i grasp the hemorrhoids, she is too sensitive to place bands. I re-injected them.  I have recommended removal surgically if these continue to bother her.  She has required many injections of these in the past.        Maudry Diego, MD Surgical Oncology, General & Endocrine Surgery [facility]  Judie Petit, MD Swords, Valetta Mole, MD

## 2013-03-14 ENCOUNTER — Ambulatory Visit (INDEPENDENT_AMBULATORY_CARE_PROVIDER_SITE_OTHER): Payer: BC Managed Care – PPO | Admitting: General Surgery

## 2013-03-14 ENCOUNTER — Encounter (INDEPENDENT_AMBULATORY_CARE_PROVIDER_SITE_OTHER): Payer: Self-pay | Admitting: General Surgery

## 2013-03-14 VITALS — BP 122/78 | HR 75 | Resp 16 | Ht 69.0 in | Wt 187.8 lb

## 2013-03-14 DIAGNOSIS — K648 Other hemorrhoids: Secondary | ICD-10-CM

## 2013-03-14 NOTE — Patient Instructions (Signed)
Follow up in 6 weeks.  Call in next few days if pain occurs.

## 2013-03-14 NOTE — Assessment & Plan Note (Signed)
The two pedunculated internal hemorrhoids are banded.    Hopefully this will resolve her problems.    If not, we may try banding again.  However, that time, if these are refractory to treatment, I would recommend surgical resection.

## 2013-03-14 NOTE — Progress Notes (Signed)
HISTORY: Patient is a 47 year old female with a long history of hemorrhoids. She has been very aggressive with avoiding constipation. She takes stool softeners and MiraLAX daily. I injected her internal hemorrhoids around 2 months ago, and she did a little better for a while.  She then developed diarrhea, and they started prolapsing again.  When they prolapse, they are very painful to her.  She is not having bleeding.     PERTINENT REVIEW OF SYSTEMS: Otherwise negative.    Filed Vitals:   03/14/13 1022  BP: 122/78  Pulse: 75  Resp: 16   Filed Weights   03/14/13 1022  Weight: 187 lb 12.8 oz (85.186 kg)    EXAM: Head: Normocephalic and atraumatic.  Eyes:  Conjunctivae are normal. Pupils are equal, round, and reactive to light. No scleral icterus.  Resp: No respiratory distress, normal effort. Neurological: Alert and oriented to person, place, and time. Coordination normal.  Rectal:  No evidence of external hemorrhoids.  + two internal hemorrhoids, one posterior and one anterior.  Pedunculated.  Both banded. Skin: Skin is warm and dry. No rash noted. No diaphoretic. No erythema. No pallor.  Psychiatric: Normal mood and affect. Normal behavior. Judgment and thought content normal.      ASSESSMENT AND PLAN:   Hemorrhoids, internal, with prolapse The two pedunculated internal hemorrhoids are banded.    Hopefully this will resolve her problems.    If not, we may try banding again.  However, that time, if these are refractory to treatment, I would recommend surgical resection.       Maudry Diego, MD Surgical Oncology, General & Endocrine Surgery [facility]  Judie Petit, MD Swords, Valetta Mole, MD

## 2013-03-22 ENCOUNTER — Other Ambulatory Visit: Payer: Self-pay | Admitting: *Deleted

## 2013-03-22 DIAGNOSIS — E039 Hypothyroidism, unspecified: Secondary | ICD-10-CM

## 2013-03-23 ENCOUNTER — Other Ambulatory Visit: Payer: Self-pay | Admitting: Internal Medicine

## 2013-03-30 ENCOUNTER — Other Ambulatory Visit (INDEPENDENT_AMBULATORY_CARE_PROVIDER_SITE_OTHER): Payer: BC Managed Care – PPO

## 2013-03-30 DIAGNOSIS — E039 Hypothyroidism, unspecified: Secondary | ICD-10-CM

## 2013-03-30 LAB — T4, FREE: Free T4: 0.63 ng/dL (ref 0.60–1.60)

## 2013-03-30 LAB — TSH: TSH: 1.74 u[IU]/mL (ref 0.35–5.50)

## 2013-04-01 ENCOUNTER — Encounter: Payer: Self-pay | Admitting: Endocrinology

## 2013-04-01 ENCOUNTER — Ambulatory Visit (INDEPENDENT_AMBULATORY_CARE_PROVIDER_SITE_OTHER): Payer: BC Managed Care – PPO | Admitting: Endocrinology

## 2013-04-01 VITALS — BP 110/78 | HR 70 | Temp 98.5°F | Resp 12 | Ht 69.0 in | Wt 186.7 lb

## 2013-04-01 DIAGNOSIS — E039 Hypothyroidism, unspecified: Secondary | ICD-10-CM

## 2013-04-01 DIAGNOSIS — R5381 Other malaise: Secondary | ICD-10-CM

## 2013-04-01 NOTE — Progress Notes (Signed)
Patient ID: Kelly Tran, female   DOB: 1966-04-09, 47 y.o.   MRN: [id number]  Reason for Appointment:  Hypothyroidism, followup visit    History of Present Illness:   The hypothyroidism was first diagnosed in 11/2011 Initially her hypothyroidism was diagnosed with symptoms of hair loss for several years and also fatigue and cold intolerance Notably her TSH at baseline was only 4.7 On her initial consultation she had been still having the symptoms above with taking 50 mcg of levothyroxine With changing to Armour Thyroid 45 mg daily in 1/14 her symptoms of fatigue and cold intolerance were better  Complaints are reported by the patient now are feeling tired and cold for the last month      She also continues to have some hair loss without alopecia Compliance with the medical regimen has been as prescribed with taking the tablet in the morning before breakfast. TSH on her last visit was 2.9  Appointment on 03/30/2013  Component Date Value Range Status  . Free T4 03/30/2013 0.63  0.60 - 1.60 ng/dL Final  . TSH 40/98/1191 1.74  0.35 - 5.50 uIU/mL Final      Medication List       This list is accurate as of: 04/01/13  9:29 AM.  Always use your most recent med list.               amitriptyline 25 MG tablet  Commonly known as:  ELAVIL  TAKE 1 TABLET BY MOUTH ATBEDTIME.     aspirin 81 MG tablet  Take 81 mg by mouth daily.     b complex vitamins tablet  Take 1 tablet by mouth daily.     CALCIUM 500 + D PO  Take 1 tablet by mouth daily.     polyethylene glycol packet  Commonly known as:  MIRALAX / GLYCOLAX  Take 17 g by mouth daily.     thyroid 90 MG tablet  Commonly known as:  ARMOUR  Take 90 mg by mouth daily.     Vitamin D 2000 UNITS Caps  Take by mouth daily.        Past Medical History  Diagnosis Date  . G E R D 02/22/2007  . Hemorrhoids, internal 01/31/2011  . Migraine   . Genital warts     Past Surgical History  Procedure Laterality Date  .  Appendectomy    . Miscarriage      x3--required d and c    Family History  Problem Relation Age of Onset  . Arthritis Neg Hx     family  . Hyperlipidemia Neg Hx     family  . Diabetes Neg Hx     family  . Stroke Mother   . Hypertension Father     Social History:  reports that she quit smoking about 17 years ago. She has never used smokeless tobacco. She reports that  drinks alcohol. She reports that she does not use illicit drugs.  Allergies: No Known Allergies  ROS  She tends to feel sleepy during the day especially in the evening, has had some insomnia, does not think she has mood swings She does try to exercise some    Examination:   BP 110/78  Pulse 70  Temp(Src) 98.5 F (36.9 C)  Resp 12  Ht 5\' 9"  (1.753 m)  Wt 186 lb 11.2 oz (84.687 kg)  BMI 27.56 kg/m2  SpO2 95%  LMP 03/11/2013   GENERAL APPEARANCE: Alert And looks well.  No puffiness  of face or periorbital edema.         NECK: no thyromegaly.          NEUROLOGIC EXAM: DTRs 2+ bilaterally at biceps.    Assessments   Hypothyroidism, mild and currently taking Armour Thyroid 45 mg with excellent levels of TSH She is subjectively having more fatigue and cord intolerance in the last month but explained to her that this should not be related to her hypothyroidism, she needs to discuss this further with her PCP She had done better with no more tired compared to levothyroxine and will continue the same   Treatment:   Continue same dosage before breakfast daily. Avoid taking any calcium or iron supplements with the thyroid supplement.    Adetokunbo Mccadden 04/01/2013, 9:29 AM

## 2013-04-03 NOTE — Patient Instructions (Signed)
No change in dosage.

## 2013-04-25 ENCOUNTER — Ambulatory Visit (INDEPENDENT_AMBULATORY_CARE_PROVIDER_SITE_OTHER): Payer: BC Managed Care – PPO | Admitting: Internal Medicine

## 2013-04-25 ENCOUNTER — Encounter: Payer: Self-pay | Admitting: Internal Medicine

## 2013-04-25 VITALS — BP 140/70 | HR 70 | Temp 98.2°F | Wt 184.0 lb

## 2013-04-25 DIAGNOSIS — H6982 Other specified disorders of Eustachian tube, left ear: Secondary | ICD-10-CM

## 2013-04-25 DIAGNOSIS — H6980 Other specified disorders of Eustachian tube, unspecified ear: Secondary | ICD-10-CM

## 2013-04-25 DIAGNOSIS — H8110 Benign paroxysmal vertigo, unspecified ear: Secondary | ICD-10-CM

## 2013-04-25 MED ORDER — FLUTICASONE PROPIONATE 50 MCG/ACT NA SUSP: NASAL | Status: DC

## 2013-04-25 MED ORDER — PREDNISONE 20 MG PO TABS: ORAL_TABLET | ORAL | Status: DC

## 2013-04-25 NOTE — Patient Instructions (Signed)
Change to plain antihistamine without the  decongestant because it hasn't helped but plain.       Add trial of 5 days of prednisone as we discussed and  Then Flonase nose spray . If still not better or recurrent we can get ent to see you.   fortunately your exam is good today.

## 2013-04-25 NOTE — Progress Notes (Signed)
Chief Complaint  Patient presents with  . Otalgia    Started 3 weeks ago.  Has tried swimmer's ear, antihistamines and decongestants.    HPI: Patient comes in today for SDA for  new problem evaluation. But has had problem for 3 weeks  ? Inner ear. Tried Futures trader and decongestants. Tried  Allegra  D and zyrtec d.   Mostly left ear but right last  Night  No fever.  Started like clogged feeling then no pain at that time  Has been given steroid shot and antihistamine in the past.   Has some positional vertigo  No air travel no submersion except shower.  Had soimilar episode in spring and got better with Allegra and steroid  No head trauma, Minimal pnd  Hearing ? Ok but left ear sharp pains at times   Hx of dental work but tooth is ok was on the left .  ROS: See pertinent positives and negatives per HPI.  Past Medical History  Diagnosis Date  . G E R D 02/22/2007  . Hemorrhoids, internal 01/31/2011  . Migraine   . Genital warts     Family History  Problem Relation Age of Onset  . Arthritis Neg Hx     family  . Hyperlipidemia Neg Hx     family  . Diabetes Neg Hx     family  . Stroke Mother   . Hypertension Father     History   Social History  . Marital Status: Married    Spouse Name: N/A    Number of Children: N/A  . Years of Education: N/A   Social History Main Topics  . Smoking status: Former Smoker    Quit date: 01/31/1996  . Smokeless tobacco: Never Used  . Alcohol Use: Yes     Comment: occ  . Drug Use: No  . Sexual Activity: None   Other Topics Concern  . None   Social History Narrative  . None    Outpatient Encounter Prescriptions as of 04/25/2013  Medication Sig Dispense Refill  . amitriptyline (ELAVIL) 25 MG tablet TAKE 1 TABLET BY MOUTH ATBEDTIME.  90 tablet  3  . aspirin 81 MG tablet Take 81 mg by mouth daily.      Marland Kitchen b complex vitamins tablet Take 1 tablet by mouth daily.      . Calcium Carbonate-Vitamin D (CALCIUM 500 + D PO) Take 1  tablet by mouth daily.      . Cholecalciferol (VITAMIN D) 2000 UNITS CAPS Take by mouth daily.      . polyethylene glycol (MIRALAX / GLYCOLAX) packet Take 17 g by mouth daily.      Marland Kitchen thyroid (ARMOUR) 90 MG tablet Take 90 mg by mouth daily.      . fluticasone (FLONASE) 50 MCG/ACT nasal spray 2 spray each nostril qd  16 g  3  . predniSONE (DELTASONE) 20 MG tablet Take 3 po qd for 2 days then 2 po qd for 3 days,or as directed  12 tablet  0   No facility-administered encounter medications on file as of 04/25/2013.    EXAM:  BP 140/70  Pulse 70  Temp(Src) 98.2 F (36.8 C) (Oral)  Wt 184 lb (83.462 kg)  BMI 27.16 kg/m2  SpO2 98%  LMP 03/11/2013  Body mass index is 27.16 kg/(m^2).  GENERAL: vitals reviewed and listed above, alert, oriented, appears well hydrated and in no acute distress HEENT: atraumatic, conjunctiva  clear, no obvious abnormalities on inspection of  external nose and ears minimal congsetion  Face non tender  Net tenderness with pinna  Tragal OP : no lesion edema or exudate  NECK: no obvious masses on inspection palpation no bruit  Adenopathy  CV: HRRR, no clubbing cyanosis or  peripheral edema nl cap refill  Neuro grossly  Normal  Gait  Nl non focal  MS: moves all extremities without noticeable focal  abnormality PSYCH: pleasant and cooperative, no obvious depression or anxiety  ASSESSMENT AND PLAN:  Discussed the following assessment and plan:  Eustachian tube dysfunction, left - ?  Benign paroxysmal positional vertigo ? - recurrent  No bacterial infection seen follow   Stop the decon as not helping and may give her se  pred and flonase and consider ent if pop   ? PT -Patient advised to return or notify health care team  if symptoms worsen or persist or new concerns arise.  Patient Instructions  Change to plain antihistamine without the  decongestant because it hasn't helped but plain.       Add trial of 5 days of prednisone as we discussed and  Then Flonase  nose spray . If still not better or recurrent we can get ent to see you.   fortunately your exam is good today.    Neta Mends. Alysandra Lobue M.D.

## 2013-04-26 ENCOUNTER — Encounter (INDEPENDENT_AMBULATORY_CARE_PROVIDER_SITE_OTHER): Payer: Self-pay | Admitting: General Surgery

## 2013-04-26 ENCOUNTER — Encounter (INDEPENDENT_AMBULATORY_CARE_PROVIDER_SITE_OTHER): Payer: BC Managed Care – PPO | Admitting: General Surgery

## 2013-04-26 ENCOUNTER — Ambulatory Visit (INDEPENDENT_AMBULATORY_CARE_PROVIDER_SITE_OTHER): Payer: BC Managed Care – PPO | Admitting: General Surgery

## 2013-04-26 VITALS — BP 128/86 | HR 68 | Temp 97.5°F | Resp 16 | Ht 69.0 in | Wt 182.2 lb

## 2013-04-26 DIAGNOSIS — K644 Residual hemorrhoidal skin tags: Secondary | ICD-10-CM

## 2013-04-26 DIAGNOSIS — K648 Other hemorrhoids: Secondary | ICD-10-CM

## 2013-04-26 HISTORY — DX: Residual hemorrhoidal skin tags: K64.4

## 2013-04-26 MED ORDER — HYDROCORTISONE 2.5 % RE CREA: TOPICAL_CREAM | Freq: Three times a day (TID) | RECTAL | Status: DC

## 2013-04-26 NOTE — Assessment & Plan Note (Signed)
Patient was advised to increase the frequency of her Anusol cream to 2-3 times a day every day for 2-3 weeks.  I will see her back in approximately 2-3 months.  She is instructed to call if she has issues before that time.

## 2013-04-26 NOTE — Assessment & Plan Note (Signed)
Because these have resolved since her banding, I did not do a repeat anoscopy on her today.

## 2013-04-26 NOTE — Patient Instructions (Signed)
Use rectal cream 2-3 times per day EVERY DAY for 3 weeks.  Follow up in 3 months.

## 2013-04-26 NOTE — Progress Notes (Signed)
HISTORY: Patient is a 47 year old female with a long history of hemorrhoids. She has been very aggressive with avoiding constipation. She takes stool softeners and MiraLAX daily. I re-injected her internal hemorrhoids around 3 months ago, and she did a little better for a while.  I banded these around a month ago.  She is doing much better. She denies any prolapse. She has been bleeding.   PERTINENT REVIEW OF SYSTEMS: Otherwise negative.    Filed Vitals:   04/26/13 1534  BP: 128/86  Pulse: 68  Temp: 97.5 F (36.4 C)  Resp: 16   Filed Weights   04/26/13 1534  Weight: 182 lb 3.2 oz (82.645 kg)    EXAM: Head: Normocephalic and atraumatic.  Eyes:  Conjunctivae are normal. Pupils are equal, round, and reactive to light. No scleral icterus.  Resp: No respiratory distress, normal effort. Neurological: Alert and oriented to person, place, and time. Coordination normal.  Rectal:  Small external pedunculated hemorrhoid on the left.   Skin: Skin is warm and dry. No rash noted. No diaphoretic. No erythema. No pallor.  Psychiatric: Normal mood and affect. Normal behavior. Judgment and thought content normal.      ASSESSMENT AND PLAN:   External hemorrhoid Patient was advised to increase the frequency of her Anusol cream to 2-3 times a day every day for 2-3 weeks.  I will see her back in approximately 2-3 months.  She is instructed to call if she has issues before that time.  Hemorrhoids, internal, with prolapse Because these have resolved since her banding, I did not do a repeat anoscopy on her today.     Maudry Diego, MD Surgical Oncology, General & Endocrine Surgery [facility]  Judie Petit, MD Swords, Valetta Mole, MD

## 2013-05-03 ENCOUNTER — Ambulatory Visit (INDEPENDENT_AMBULATORY_CARE_PROVIDER_SITE_OTHER): Payer: BC Managed Care – PPO | Admitting: Internal Medicine

## 2013-05-03 ENCOUNTER — Encounter: Payer: Self-pay | Admitting: Internal Medicine

## 2013-05-03 VITALS — BP 112/72 | HR 69 | Temp 98.0°F | Wt 188.0 lb

## 2013-05-03 DIAGNOSIS — H9202 Otalgia, left ear: Secondary | ICD-10-CM

## 2013-05-03 DIAGNOSIS — R42 Dizziness and giddiness: Secondary | ICD-10-CM

## 2013-05-03 DIAGNOSIS — IMO0001 Reserved for inherently not codable concepts without codable children: Secondary | ICD-10-CM

## 2013-05-03 DIAGNOSIS — H9209 Otalgia, unspecified ear: Secondary | ICD-10-CM

## 2013-05-03 LAB — CBC WITH DIFFERENTIAL/PLATELET
Basophils Absolute: 0 10*3/uL (ref 0.0–0.1)
Basophils Relative: 0.4 % (ref 0.0–3.0)
Eosinophils Absolute: 0.2 10*3/uL (ref 0.0–0.7)
Eosinophils Relative: 2.1 % (ref 0.0–5.0)
HCT: 40.8 % (ref 36.0–46.0)
Lymphs Abs: 3.2 10*3/uL (ref 0.7–4.0)
MCHC: 33.7 g/dL (ref 30.0–36.0)
MCV: 90.8 fl (ref 78.0–100.0)
Monocytes Absolute: 0.7 10*3/uL (ref 0.1–1.0)
Neutrophils Relative %: 57.6 % (ref 43.0–77.0)
RBC: 4.49 Mil/uL (ref 3.87–5.11)
RDW: 12.1 % (ref 11.5–14.6)
WBC: 9.8 10*3/uL (ref 4.5–10.5)

## 2013-05-03 LAB — BASIC METABOLIC PANEL
BUN: 17 mg/dL (ref 6–23)
Calcium: 9.6 mg/dL (ref 8.4–10.5)
Creatinine, Ser: 0.6 mg/dL (ref 0.4–1.2)
GFR: 104.62 mL/min (ref >60.00)
Glucose, Bld: 85 mg/dL (ref 70–99)
Sodium: 137 mEq/L (ref 135–145)

## 2013-05-03 LAB — HEPATIC FUNCTION PANEL
AST: 16 U/L (ref 0–37)
Albumin: 3.8 g/dL (ref 3.5–5.2)
Total Bilirubin: 0.5 mg/dL (ref 0.3–1.2)

## 2013-05-03 LAB — SEDIMENTATION RATE: Sed Rate: 1 mm/hr (ref 0–22)

## 2013-05-03 NOTE — Progress Notes (Signed)
Chief Complaint  Patient presents with  . Otalgia    Is worsening. body aches     HPI: Fu of problem  See last week visit  Not better  May have have fever   Body aches over weekend   Ear pain some better on pred but then still clogged feeling   Ear pain is dull ache.  And feels clogged  Like water  Last  pred sat and still   Some sx and then felt body aches  . After of  . Had swollen glands and severe tenderness to neck and glands   About 50 % better today . Dizziness no change   ROS: See pertinent positives and negatives per HPI. No cough swallowing dysfunction numbness or weakness   Past Medical History  Diagnosis Date  . G E R D 02/22/2007  . Hemorrhoids, internal 01/31/2011  . Migraine   . Genital warts     Family History  Problem Relation Age of Onset  . Arthritis Neg Hx     family  . Hyperlipidemia Neg Hx     family  . Diabetes Neg Hx     family  . Stroke Mother   . Hypertension Father     History   Social History  . Marital Status: Married    Spouse Name: N/A    Number of Children: N/A  . Years of Education: N/A   Social History Main Topics  . Smoking status: Former Smoker    Quit date: 01/31/1996  . Smokeless tobacco: Never Used  . Alcohol Use: Yes     Comment: occ  . Drug Use: No  . Sexual Activity: None   Other Topics Concern  . None   Social History Narrative  . None    Outpatient Encounter Prescriptions as of 05/03/2013  Medication Sig Dispense Refill  . amitriptyline (ELAVIL) 25 MG tablet TAKE 1 TABLET BY MOUTH ATBEDTIME.  90 tablet  3  . aspirin 81 MG tablet Take 81 mg by mouth daily.      Marland Kitchen b complex vitamins tablet Take 1 tablet by mouth daily.      . Calcium Carbonate-Vitamin D (CALCIUM 500 + D PO) Take 1 tablet by mouth daily.      . Cholecalciferol (VITAMIN D) 2000 UNITS CAPS Take by mouth daily.      . fluticasone (FLONASE) 50 MCG/ACT nasal spray 2 spray each nostril qd  16 g  3  . hydrocortisone (ANUSOL-HC) 2.5 % rectal cream Place  rectally 3 (three) times daily.  30 g  3  . polyethylene glycol (MIRALAX / GLYCOLAX) packet Take 17 g by mouth daily.      Marland Kitchen thyroid (ARMOUR) 90 MG tablet Take 90 mg by mouth daily.      . [DISCONTINUED] predniSONE (DELTASONE) 20 MG tablet Take 3 po qd for 2 days then 2 po qd for 3 days,or as directed  12 tablet  0   No facility-administered encounter medications on file as of 05/03/2013.    EXAM:  BP 112/72  Pulse 69  Temp(Src) 98 F (36.7 C) (Oral)  Wt 188 lb (85.276 kg)  BMI 27.75 kg/m2  SpO2 98%  Body mass index is 27.75 kg/(m^2).  GENERAL: vitals reviewed and listed above, alert, oriented, appears well hydrated and in no acute distress  HEENT: atraumatic, conjunctiva  clear, no obvious abnormalities on inspection of external nose and ears  ? Tender near ear tmj  OP : no lesion edema or exudate  Face non tender NECK: no obvious masses on inspection palpation  Shoddy ac nodes tender  Ac left mor than right  LUNGS: clear to auscultation bilaterally, no wheezes, rales or rhonchi, good air movement CV: HRRR, no clubbing cyanosis  nl cap refill  MS: moves all extremities without noticeable focal  abnormality PSYCH: pleasant and cooperative, no obvious depression or anxiety Neuro grossly non focal   ASSESSMENT AND PLAN:  Discussed the following assessment and plan:  Ear pain, left - Plan: CBC with Differential, Basic metabolic panel, Hepatic function panel, Sedimentation rate, Epstein-Barr virus VCA antibody panel, CK, Ambulatory referral to ENT  Myalgia and myositis - Plan: CBC with Differential, Basic metabolic panel, Hepatic function panel, Sedimentation rate, Epstein-Barr virus VCA antibody panel, CK  Dizziness and giddiness Uncertain cause of all  ? If off pred made myalgias but was only on 5 days ; n explanation for the ear sx otherwise  ? cryptic sinusitis     . Check labs today  Refer  To ent for evaluation.     -Patient advised to return or notify health care team  if  symptoms worsen or persist or new concerns arise.  Patient Instructions  Labs today    Blood count .  Will get ent to see you to help with next  step.   Neta Mends. Crystie Yanko M.D.

## 2013-05-03 NOTE — Patient Instructions (Signed)
Labs today    Blood count .  Will get ent to see you to help with next  step.

## 2013-05-04 ENCOUNTER — Encounter: Payer: Self-pay | Admitting: Family Medicine

## 2013-05-04 LAB — EPSTEIN-BARR VIRUS VCA ANTIBODY PANEL
EBV EA IgG: 6.7 U/mL (ref <9.0)
EBV NA IgG: 29.1 U/mL — ABNORMAL HIGH (ref <18.0)
EBV VCA IgG: >750 U/mL — ABNORMAL HIGH (ref <18.0)
EBV VCA IgM: <10 U/mL (ref <36.0)

## 2013-06-07 ENCOUNTER — Other Ambulatory Visit: Payer: Self-pay | Admitting: Obstetrics and Gynecology

## 2013-07-14 ENCOUNTER — Ambulatory Visit (INDEPENDENT_AMBULATORY_CARE_PROVIDER_SITE_OTHER): Payer: BC Managed Care – PPO | Admitting: General Surgery

## 2013-08-23 ENCOUNTER — Other Ambulatory Visit: Payer: Self-pay | Admitting: Internal Medicine

## 2013-08-25 ENCOUNTER — Ambulatory Visit (INDEPENDENT_AMBULATORY_CARE_PROVIDER_SITE_OTHER): Payer: BC Managed Care – PPO | Admitting: General Surgery

## 2013-08-25 ENCOUNTER — Encounter (INDEPENDENT_AMBULATORY_CARE_PROVIDER_SITE_OTHER): Payer: Self-pay | Admitting: General Surgery

## 2013-08-25 VITALS — BP 128/84 | HR 76 | Temp 98.3°F | Resp 12 | Ht 69.0 in | Wt 193.6 lb

## 2013-08-25 DIAGNOSIS — K649 Unspecified hemorrhoids: Secondary | ICD-10-CM

## 2013-08-25 DIAGNOSIS — K644 Residual hemorrhoidal skin tags: Secondary | ICD-10-CM

## 2013-08-25 MED ORDER — HYDROCODONE-ACETAMINOPHEN 5-325 MG PO TABS: 1.0000 | ORAL_TABLET | ORAL | Status: DC | PRN

## 2013-08-25 NOTE — Progress Notes (Signed)
PRE-OPERATIVE DIAGNOSIS: external hemorrhoid/skin tag  POST-OPERATIVE DIAGNOSIS:  Same  PROCEDURE:  Procedure(s): Excision of anal skin tag/hemorrhoid  SURGEON:  Surgeon(s): Almond LintFaera Daleigh Pollinger, MD  ANESTHESIA:   local  DRAINS: none   LOCAL MEDICATIONS USED:  LIDOCAINE   SPECIMEN:  Source of Specimen:  anal skin tag/hemorrhoid  DISPOSITION OF SPECIMEN:  PATHOLOGY  Ebl: MINIMAL  PROCEDURE  Patient was identified in the room and laid in the left lateral decubitus position. The perianal area was prepped.   Informed consent was obtained earlier in the day.  The left anterior lateral skin tag was anesthetized 1% lidocaine with epinephrine. The skin tag was elevated with the forceps and the base was divided with the scissors. 2 interrupted 4-0 chromic sutures were placed to close the skin defect. Patient tolerated procedure well. There is no evidence of hemorrhage. Followup with the patient was arranged.

## 2013-08-25 NOTE — Patient Instructions (Signed)
[facility]  RECTAL SURGERY POST OP INSTRUCTIONS: POST OP INSTRUCTIONS  Always review your discharge instruction sheet given to you by the facility where your surgery was performed. IF YOU HAVE DISABILITY OR FAMILY LEAVE FORMS, YOU MUST BRING THEM TO THE OFFICE FOR PROCESSING.   DO NOT GIVE THEM TO YOUR DOCTOR.  1. A  prescription for pain medication may be given to you upon discharge.  Take your pain medication as prescribed, if needed.  If narcotic pain medicine is not needed, then you may take acetaminophen (Tylenol) or ibuprofen (Advil) as needed. 2. Take your usually prescribed medications unless otherwise directed. 3. If you need a refill on your pain medication, please contact your pharmacy.  They will contact our office to request authorization. Prescriptions will not be filled after 5 pm or on week-ends. 4. You should follow a light diet the first 48 hours after arrival home, such as soup and crackers, etc.  Be sure to include lots of fluids daily.  Resume your normal diet 2-3 days after surgery.. 5. Most patients will experience some swelling and discomfort in the rectal area. Ice packs, reclining and warm tub soaks will help.  Swelling and discomfort can take several days to resolve.  6. It is common to experience some constipation if taking pain medication after surgery.  Increasing fluid intake and taking a stool softener (such as Colace) will usually help or prevent this problem from occurring.  A mild laxative (Milk of Magnesia or Miralax) should be taken according to package directions if there are no bowel movements after 48 hours. 7. Unless discharge instructions indicate otherwise, leave your bandage dry and in place for 24 hours, or remove the bandage if you have a bowel movement. You may notice a small amount of bleeding with bowel movements for the first few days. You may have some packing in the rectum which will come out over the first day or two. You  will need to wear an absorbent pad or soft cotton gauze in your underwear until the drainage stops.it. 8. ACTIVITIES:  You may resume regular (light) daily activities beginning the next day-such as daily self-care, walking, climbing stairs-gradually increasing activities as tolerated.  You may have sexual intercourse when it is comfortable.  Refrain from any heavy lifting or straining until approved by your doctor. a. You may drive when you are no longer taking prescription pain medication, you can comfortably wear a seatbelt, and you can safely maneuver your car and apply brakes. b. RETURN TO WORK: : _____as tolerated._______________ c.  9. You should see your doctor in the office for a follow-up appointment approximately 2-3 weeks after your surgery.  Make sure that you call for this appointment within a day or two after you arrive home to insure a convenient appointment time. 10. OTHER INSTRUCTIONS:  _________DO NOT DRIVE WHILE TAKING NARCOTICS_________________________________________________________________________________________________________________________________________________________________________________  WHEN TO CALL YOUR DOCTOR: 1. Fever over 101.0 2. Inability to urinate 3. Nausea and/or vomiting 4. Extreme swelling or bruising 5. Continued bleeding from rectum. 6. Increased pain, redness, or drainage from the incision 7. Constipation  The clinic staff is available to answer your questions during regular business hours.  Please don't hesitate to call and ask to speak to one of the nurses for clinical concerns.  If you have a medical emergency, go to the nearest emergency room or call 911.  A surgeon from [facility] is always on call at the hospital   [address],  [address], [place], [place]  [address] ?  [address] [address], [place], [place]   [address] [phone] ? [phone] ? FAX [phone] Web site: [email or web address]

## 2013-08-25 NOTE — Assessment & Plan Note (Signed)
Pt  tolerated procedure well.

## 2013-08-25 NOTE — Assessment & Plan Note (Signed)
Plan excision of this this afternoon. The patient does need to go back to work today now.  I discussed the procedure. I discussed with the patient will need stool softeners. We have gotten consent for the procedure now. She is going to have someone drive her. I did give her prescription for pain medication to take before she comes.

## 2013-08-25 NOTE — Progress Notes (Signed)
HISTORY: Patient is a 48 year old female with a long history of hemorrhoids. She has been very aggressive with avoiding constipation. She takes stool softeners and MiraLAX daily. I re-injected her internal hemorrhoids around 6 months ago, and banded a large internal one 4 months ago.  She continues to have issues with an external hemorrhoid. There is one that is right on the anterior aspect that itches and burns. It does periodically get swollen and painful.  She would like to have this removed in the office.   PERTINENT REVIEW OF SYSTEMS: Otherwise negative.    Filed Vitals:   08/25/13 1037  BP: 128/84  Pulse: 76  Temp: 98.3 F (36.8 C)  Resp: 12   Filed Weights   08/25/13 1037  Weight: 193 lb 9.6 oz (87.816 kg)    EXAM: Head: Normocephalic and atraumatic.  Eyes:  Conjunctivae are normal. Pupils are equal, round, and reactive to light. No scleral icterus.  Resp: No respiratory distress, normal effort. Neurological: Alert and oriented to person, place, and time. Coordination normal.  Rectal:  Small external pedunculated hemorrhoid anteriorly.   Skin: Skin is warm and dry. No rash noted. No diaphoretic. No erythema. No pallor.  Psychiatric: Normal mood and affect. Normal behavior. Judgment and thought content normal.    ASSESSMENT AND PLAN:   External hemorrhoid Plan excision of this this afternoon. The patient does need to go back to work today now.  I discussed the procedure. I discussed with the patient will need stool softeners. We have gotten consent for the procedure now. She is going to have someone drive her. I did give her prescription for pain medication to take before she comes.     Maudry DiegoFaera L Peg Fifer, MD Surgical Oncology, General & Endocrine Surgery [facility]  Judie PetitSWORDS,BRUCE HENRY, MD Swords, Valetta MoleBruce H, MD

## 2013-08-25 NOTE — Patient Instructions (Signed)
Will plan hemorrhoidectomy this afternoon.    Take pain pills around 30 min prior to appt time.  Do not drive yourself.

## 2013-09-06 ENCOUNTER — Telehealth: Payer: Self-pay | Admitting: Endocrinology

## 2013-09-06 ENCOUNTER — Other Ambulatory Visit: Payer: Self-pay | Admitting: *Deleted

## 2013-09-06 MED ORDER — THYROID 90 MG PO TABS: 90.0000 mg | ORAL_TABLET | Freq: Every day | ORAL | Status: DC

## 2013-09-06 NOTE — Telephone Encounter (Signed)
rx sent, patient aware 

## 2013-09-06 NOTE — Telephone Encounter (Signed)
Pt states she went to refill her Armour medication and it had expired   Call Back: [phone]  Thank You :)

## 2013-09-12 ENCOUNTER — Encounter (INDEPENDENT_AMBULATORY_CARE_PROVIDER_SITE_OTHER): Payer: Self-pay | Admitting: General Surgery

## 2013-09-12 ENCOUNTER — Ambulatory Visit (INDEPENDENT_AMBULATORY_CARE_PROVIDER_SITE_OTHER): Payer: BC Managed Care – PPO | Admitting: General Surgery

## 2013-09-12 VITALS — BP 126/74 | HR 78 | Temp 97.9°F | Resp 16 | Ht 69.0 in | Wt 186.0 lb

## 2013-09-12 DIAGNOSIS — K644 Residual hemorrhoidal skin tags: Secondary | ICD-10-CM

## 2013-09-12 NOTE — Progress Notes (Signed)
HISTORY: Pt is doing well s/p excision of external hemorrhoid/skin tag.  She is not having pain.  She denies bleeding.      EXAM: General:  Alert and oriented.   Incision:  Well healed.  No evidence of fissure.     PATHOLOGY: Hemorrhoids, rectum - BENIGN SQUAMOUS MUCOSA WITH UNDERLYING DILATED VESSELS CONSISTENT WITH HEMORRHOIDS. - NO DYSPLASIA, ATYPIA OR MALIGNANCY IDENTIFIED.   ASSESSMENT AND PLAN:   External hemorrhoid No evidence of surgical complications.  Follow up as needed.        Maudry DiegoFaera L Kseniya Grunden, MD Surgical Oncology, General & Endocrine Surgery [facility]  Judie PetitSWORDS,BRUCE HENRY, MD Swords, Valetta MoleBruce H, MD

## 2013-09-12 NOTE — Assessment & Plan Note (Signed)
No evidence of surgical complications.  Follow up as needed.   

## 2013-09-27 ENCOUNTER — Other Ambulatory Visit (INDEPENDENT_AMBULATORY_CARE_PROVIDER_SITE_OTHER): Payer: BC Managed Care – PPO

## 2013-09-27 ENCOUNTER — Other Ambulatory Visit: Payer: BC Managed Care – PPO

## 2013-09-27 DIAGNOSIS — E039 Hypothyroidism, unspecified: Secondary | ICD-10-CM

## 2013-09-27 LAB — TSH: TSH: 3.32 u[IU]/mL (ref 0.35–5.50)

## 2013-09-27 LAB — T4, FREE: FREE T4: 0.58 ng/dL — AB (ref 0.60–1.60)

## 2013-09-27 LAB — T3, FREE: T3, Free: 2.9 pg/mL (ref 2.3–4.2)

## 2013-09-29 ENCOUNTER — Ambulatory Visit (INDEPENDENT_AMBULATORY_CARE_PROVIDER_SITE_OTHER): Payer: BC Managed Care – PPO | Admitting: Endocrinology

## 2013-09-29 ENCOUNTER — Encounter: Payer: Self-pay | Admitting: Endocrinology

## 2013-09-29 VITALS — BP 118/68 | HR 81 | Temp 98.2°F | Resp 14 | Ht 69.0 in | Wt 190.8 lb

## 2013-09-29 DIAGNOSIS — E039 Hypothyroidism, unspecified: Secondary | ICD-10-CM

## 2013-09-29 MED ORDER — THYROID 90 MG PO TABS: 90.0000 mg | ORAL_TABLET | Freq: Every day | ORAL | Status: DC

## 2013-09-29 NOTE — Progress Notes (Signed)
Patient ID: Kelly Tran, female   DOB: 12-10-1965, 48 y.o.   MRN: [id number]   Reason for Appointment:  Hypothyroidism, followup visit   History of Present Illness:   The hypothyroidism was first diagnosed in 11/2011 Initially her hypothyroidism was diagnosed with symptoms of hair loss for several years and also fatigue and cold intolerance Notably her TSH at baseline was only 4.7 On her initial consultation she had been still having the symptoms above with taking 50 mcg of levothyroxine With changing to Armour Thyroid 45 mg daily in 07/2012 her symptoms of fatigue and cold intolerance were improved  Complaints are reported by the patient now are none. Previously was feeling tired and cold for about a month      She finally has had improvement in her hair loss and it is not concerned about this now  Compliance with the medical regimen has been as prescribed with taking the tablet in the morning before breakfast. She is taking a half of the 90 mg tablet.  Lab Results  Component Value Date   TSH 3.32 09/27/2013   TSH 1.74 03/30/2013   TSH 0.49 06/10/2012     Appointment on 09/27/2013  Component Date Value Ref Range Status  . Free T4 09/27/2013 0.58* 0.60 - 1.60 ng/dL Final  . TSH 21/30/8657 3.32  0.35 - 5.50 uIU/mL Final  . T3, Free 09/27/2013 2.9  2.3 - 4.2 pg/mL Final      Medication List       This list is accurate as of: 09/29/13 10:49 AM.  Always use your most recent med list.               amitriptyline 25 MG tablet  Commonly known as:  ELAVIL  TAKE 1 TABLET BY MOUTH ATBEDTIME.     aspirin 81 MG tablet  Take 81 mg by mouth daily.     b complex vitamins tablet  Take 1 tablet by mouth daily.     CALCIUM 500 + D PO  Take 1 tablet by mouth daily.     fluticasone 50 MCG/ACT nasal spray  Commonly known as:  FLONASE  2 spray each nostril qd     polyethylene glycol packet  Commonly known as:  MIRALAX / GLYCOLAX  Take 17 g by mouth daily.     thyroid 90 MG  tablet  Commonly known as:  ARMOUR  Take 1 tablet (90 mg total) by mouth daily.     Vitamin D 2000 UNITS Caps  Take by mouth daily.        Past Medical History  Diagnosis Date  . G E R D 02/22/2007  . Hemorrhoids, internal 01/31/2011  . Migraine   . Genital warts     Past Surgical History  Procedure Laterality Date  . Appendectomy    . Miscarriage      x3--required d and c    Family History  Problem Relation Age of Onset  . Arthritis Neg Hx     family  . Hyperlipidemia Neg Hx     family  . Diabetes Neg Hx     family  . Stroke Mother   . Hypertension Father     Social History:  reports that she quit smoking about 17 years ago. She has never used smokeless tobacco. She reports that she drinks alcohol. She reports that she does not use illicit drugs.  Allergies: No Known Allergies  ROS  No history of hypertension    Examination:  BP 118/68  Pulse 81  Temp(Src) 98.2 F (36.8 C)  Resp 14  Ht 5\' 9"  (1.753 m)  Wt 190 lb 12.8 oz (86.546 kg)  BMI 28.16 kg/m2  SpO2 97%   GENERAL APPEARANCE:  she looks well.  No puffiness of face or periorbital edema.         NECK: no thyromegaly.          NEUROLOGIC EXAM:  biceps reflexes 2+ bilaterally.    Assessments   Hypothyroidism, mild and currently taking Armour Thyroid 45 mg with improvement in her symptoms of fatigue, cold intolerance and hair loss now More recently her TSH is trending higher with the same doses that she has been for over a year Previously had preferred Armour Thyroid over levothyroxine and subjectively felt better   Plan:   Continue same dosage before breakfast daily except take a full tablet once a week.  Avoid taking any calcium or iron supplements with the thyroid supplement.   Follow up in 4 months  Kelly Tran 09/29/2013, 10:49 AM

## 2014-01-25 ENCOUNTER — Ambulatory Visit (INDEPENDENT_AMBULATORY_CARE_PROVIDER_SITE_OTHER): Payer: BC Managed Care – PPO | Admitting: General Surgery

## 2014-01-25 ENCOUNTER — Encounter (INDEPENDENT_AMBULATORY_CARE_PROVIDER_SITE_OTHER): Payer: Self-pay | Admitting: General Surgery

## 2014-01-25 VITALS — BP 128/88 | HR 76 | Temp 97.1°F | Resp 18 | Ht 69.0 in | Wt 185.6 lb

## 2014-01-25 DIAGNOSIS — K648 Other hemorrhoids: Secondary | ICD-10-CM

## 2014-01-25 NOTE — Patient Instructions (Signed)
Continue avoiding constipation.  Follow up as needed.   

## 2014-01-25 NOTE — Progress Notes (Signed)
HISTORY: Patient is a 48 year old female with a long history of hemorrhoids. She has had injections and banding internally and has had excision of a prominent external skin tag.  She was doing well until she had a few hard stools and started having a few bloody bowel movements.  She describes blood when she wipes as well as some blood dripping into toilet.    PERTINENT REVIEW OF SYSTEMS: Otherwise negative x 11.    Filed Vitals:   01/25/14 1439  BP: 128/88  Pulse: 76  Temp: 97.1 F (36.2 C)  Resp: 18   Filed Weights   01/25/14 1439  Weight: 185 lb 9.6 oz (84.188 kg)    EXAM: Head: Normocephalic and atraumatic.  Eyes:  Conjunctivae are normal. Pupils are equal, round, and reactive to light. No scleral icterus.  Resp: No respiratory distress, normal effort. Neurological: Alert and oriented to person, place, and time. Coordination normal.  Rectal:  2 moderate size hemorrhoids posteriorly.   Skin: Skin is warm and dry. No rash noted. No diaphoretic. No erythema. No pallor.  Psychiatric: Normal mood and affect. Normal behavior. Judgment and thought content normal.    ASSESSMENT AND PLAN:   Hemorrhoids, internal, with prolapse Posterior hemorrhoids injected.    Continue miralax, high water intake, avoiding constipation.  Follow up as needed.     Maudry DiegoFaera L Lekisha Mcghee, MD Surgical Oncology, General & Endocrine Surgery [facility]  Judie PetitSWORDS,BRUCE HENRY, MD No ref. provider found

## 2014-01-25 NOTE — Assessment & Plan Note (Signed)
Posterior hemorrhoids injected.    Continue miralax, high water intake, avoiding constipation.  Follow up as needed.

## 2014-01-26 ENCOUNTER — Other Ambulatory Visit (INDEPENDENT_AMBULATORY_CARE_PROVIDER_SITE_OTHER): Payer: BC Managed Care – PPO

## 2014-01-26 DIAGNOSIS — E039 Hypothyroidism, unspecified: Secondary | ICD-10-CM

## 2014-01-26 LAB — TSH: TSH: 0.62 u[IU]/mL (ref 0.35–4.50)

## 2014-01-30 ENCOUNTER — Encounter: Payer: Self-pay | Admitting: Endocrinology

## 2014-01-30 ENCOUNTER — Ambulatory Visit (INDEPENDENT_AMBULATORY_CARE_PROVIDER_SITE_OTHER): Payer: BC Managed Care – PPO | Admitting: Endocrinology

## 2014-01-30 VITALS — BP 105/76 | HR 69 | Temp 98.0°F | Resp 16 | Ht 69.0 in | Wt 187.4 lb

## 2014-01-30 DIAGNOSIS — E039 Hypothyroidism, unspecified: Secondary | ICD-10-CM

## 2014-01-30 NOTE — Progress Notes (Signed)
Patient ID: Kelly Tran, female   DOB: 1966/03/12, 48 y.o.   MRN: [id number]   Reason for Appointment:  Hypothyroidism, followup visit   History of Present Illness:   The hypothyroidism was first diagnosed in 11/2011 Initially her hypothyroidism was diagnosed with symptoms of hair loss for several years and also fatigue and cold intolerance Notably her TSH at baseline was only 4.7 On her initial consultation she had been still having the symptoms above with taking 50 mcg of levothyroxine With changing to Armour Thyroid 45 mg daily in 07/2012 her symptoms of fatigue and cold intolerance were improved  She had with some concerns about hair loss but this has subsequently improved She does not think she has any unusual fatigue or lethargy She does feel cold sensitive for the last couple of years and this is about the same Her weight is stable      Compliance with the medical regimen has been as prescribed with taking the tablet in the morning before breakfast. She is taking a half of the 90 mg tablet.  Lab Results  Component Value Date   TSH 0.62 01/26/2014   TSH 3.32 09/27/2013   TSH 1.74 03/30/2013     Appointment on 01/26/2014  Component Date Value Ref Range Status  . TSH 01/26/2014 0.62  0.35 - 4.50 uIU/mL Final      Medication List       This list is accurate as of: 01/30/14  8:08 AM.  Always use your most recent med list.               amitriptyline 25 MG tablet  Commonly known as:  ELAVIL  TAKE 1 TABLET BY MOUTH ATBEDTIME.     aspirin 81 MG tablet  Take 81 mg by mouth daily.     b complex vitamins tablet  Take 1 tablet by mouth daily.     CALCIUM 500 + D PO  Take 1 tablet by mouth daily.     fluticasone 50 MCG/ACT nasal spray  Commonly known as:  FLONASE  2 spray each nostril qd     polyethylene glycol packet  Commonly known as:  MIRALAX / GLYCOLAX  Take 17 g by mouth daily.     thyroid 90 MG tablet  Commonly known as:  ARMOUR  Take 1 tablet (90 mg  total) by mouth daily.     Vitamin D 2000 UNITS Caps  Take by mouth daily.        Past Medical History  Diagnosis Date  . G E R D 02/22/2007  . Hemorrhoids, internal 01/31/2011  . Migraine   . Genital warts     Past Surgical History  Procedure Laterality Date  . Appendectomy    . Miscarriage      x3--required d and c    Family History  Problem Relation Age of Onset  . Arthritis Neg Hx     family  . Hyperlipidemia Neg Hx     family  . Diabetes Neg Hx     family  . Stroke Mother   . Hypertension Father     Social History:  reports that she quit smoking about 18 years ago. She has never used smokeless tobacco. She reports that she drinks alcohol. She reports that she does not use illicit drugs.  Allergies: No Known Allergies  ROS  No history of hypertension  Asking about pain in her left forearm.   Examination:   BP 105/76  Pulse 69  Temp(Src)  98 F (36.7 C)  Resp 16  Ht 5\' 9"  (1.753 m)  Wt 187 lb 6.4 oz (85.004 kg)  BMI 27.66 kg/m2  SpO2 98%   GENERAL APPEARANCE:  she looks well.  No puffiness of face or hands       NECK: Thyroid not palpable         NEUROLOGIC EXAM:  biceps reflexes 2+ bilaterally.    Assessment/ Plan:  Hypothyroidism, mild and currently taking Armour Thyroid 45 mg, 8 tablets a week with improvement in her symptoms of fatigue and hair loss  Subjectively she had improved taking Armour Thyroid compared to levothyroxine Her TSH is quite normal now and she can continue the same regimen She will followup in one year unless she has recurrence of her fatigue  Sufian Ravi 01/30/2014, 8:08 AM

## 2014-02-01 ENCOUNTER — Encounter: Payer: Self-pay | Admitting: Internal Medicine

## 2014-02-01 ENCOUNTER — Ambulatory Visit (INDEPENDENT_AMBULATORY_CARE_PROVIDER_SITE_OTHER): Payer: BC Managed Care – PPO | Admitting: Internal Medicine

## 2014-02-01 VITALS — BP 120/80 | HR 60 | Temp 98.0°F | Wt 187.0 lb

## 2014-02-01 DIAGNOSIS — M65849 Other synovitis and tenosynovitis, unspecified hand: Secondary | ICD-10-CM

## 2014-02-01 DIAGNOSIS — M6580 Other synovitis and tenosynovitis, unspecified site: Secondary | ICD-10-CM

## 2014-02-01 DIAGNOSIS — M65839 Other synovitis and tenosynovitis, unspecified forearm: Secondary | ICD-10-CM

## 2014-02-01 DIAGNOSIS — M778 Other enthesopathies, not elsewhere classified: Secondary | ICD-10-CM

## 2014-02-01 MED ORDER — NAPROXEN 500 MG PO TABS: 500.0000 mg | ORAL_TABLET | Freq: Two times a day (BID) | ORAL | Status: DC

## 2014-02-01 NOTE — Progress Notes (Signed)
Pre visit review using our clinic review tool, if applicable. No additional management support is needed unless otherwise documented below in the visit note.   Chief Complaint  Patient presents with  . Arm Pain    right    HPI: Patient comes in today for SDA for  new problem evaluation. pcp NA Patient is a 48 year old female right handed who developed Onset about 2 months ago without a specific injury right arm pain just distal to the right elbow. Worse with picking up things squeezing moving flexion against resistance. She's used over-the-counter ibuprofen for couple days ice and then heat without relief.  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 No specific sport.  No history of bleeding ulcers. ROS: See pertinent positives and negatives per HPI.  Past Medical History  Diagnosis Date  . G E R D 02/22/2007  . Hemorrhoids, internal 01/31/2011  . Migraine   . Genital warts     Family History  Problem Relation Age of Onset  . Arthritis Neg Hx     family  . Hyperlipidemia Neg Hx     family  . Diabetes Neg Hx     family  . Stroke Mother   . Hypertension Father     History   Social History  . Marital Status: Married    Spouse Name: N/A    Number of Children: N/A  . Years of Education: N/A   Social History Main Topics  . Smoking status: Former Smoker    Quit date: 01/31/1996  . Smokeless tobacco: Never Used  . Alcohol Use: Yes     Comment: occ  . Drug Use: No  . Sexual Activity: None   Other Topics Concern  . None   Social History Narrative  . None    Outpatient Encounter Prescriptions as of 02/01/2014  Medication Sig  . amitriptyline (ELAVIL) 25 MG tablet TAKE 1 TABLET BY MOUTH ATBEDTIME.  Marland Kitchen aspirin 81 MG tablet Take 81 mg by mouth daily.  Marland Kitchen b complex vitamins tablet Take 1 tablet by mouth daily.  . Calcium Carbonate-Vitamin D (CALCIUM 500 + D PO) Take 1 tablet by mouth daily.   . Cholecalciferol (VITAMIN D) 2000 UNITS CAPS Take by mouth daily.  . fluticasone (FLONASE) 50 MCG/ACT nasal spray 2 spray each nostril qd  . polyethylene glycol (MIRALAX / GLYCOLAX) packet Take 17 g by mouth daily.  Marland Kitchen thyroid (ARMOUR) 90 MG tablet Take 1 tablet (90 mg total) by mouth daily.  . naproxen (NAPROSYN) 500 MG tablet Take 1 tablet (500 mg total) by mouth 2 (two) times daily with a meal.    EXAM:  BP 120/80  Pulse 60  Temp(Src) 98 F (36.7 C) (Oral)  Wt 187 lb (84.823 kg)  Body mass index is 27.6 kg/(m^2).  GENERAL: vitals reviewed and listed above, alert, oriented, appears well hydrated and in no acute distress HEENT: atraumatic, conjunctiva  clear, no obvious abnormalities on inspection of external nose and ears MS: moves all extremities without noticeable focal  abnormality no atrophy R.elbow no swelling and full rom  BR and tendon attachment tenderness around the right brachial radialis and extensor carpi  Area  and tender  No redness or crepitus  Pain with squeeze  Shoulder normal. PSYCH: pleasant and cooperative, no obvious depression or anxiety  ASSESSMENT AND PLAN:  Discussed the following assessment and plan:  Extensor carpi radialis longus tenosynovitis  Right elbow  tendinitis Sic overus tensinitis and relative rest nsaid cold rx and fu if  persistent or progressive  -Patient advised to return or notify health care team  if symptoms worsen ,persist or new concerns arise.  Patient Instructions   This acts like tendinitis around the elbow. Did use cold /ice in the evening or as needed5-10 minutes . Avoid lifting against rotational force make sure your work station is comfortable.  Take anti-inflammatory medication twice a day for 7-14 days.  If this is not improving we can get sports medicine to evaluate. Some people are helped by elbow straps  Lateral Epicondylitis (Tennis Elbow) with Rehab Lateral epicondylitis involves inflammation and pain around the  outer portion of the elbow. The pain is caused by inflammation of the tendons in the forearm that bring back (extend) the wrist. Lateral epicondylitis is also called tennis elbow, because it is very common in tennis players. However, it may occur in any individual who extends the wrist repetitively. If lateral epicondylitis is left untreated, it may become a chronic problem. SYMPTOMS   Pain, tenderness, and inflammation on the outer (lateral) side of the elbow.  Pain or weakness with gripping activities.  Pain that increases with wrist-twisting motions (playing tennis, using a screwdriver, opening a door or a jar).  Pain with lifting objects, including a coffee cup. CAUSES  Lateral epicondylitis is caused by inflammation of the tendons that extend the wrist. Causes of injury may include:  Repetitive stress and strain on the muscles and tendons that extend the wrist.  Sudden change in activity level or intensity.  Incorrect grip in racquet sports.  Incorrect grip size of racquet (often too large).  Incorrect hitting position or technique (usually backhand, leading with the elbow).  Using a racket that is too heavy. RISK INCREASES WITH:  Sports or occupations that require repetitive and/or strenuous forearm and wrist movements (tennis, squash, racquetball, carpentry).  Poor wrist and forearm strength and flexibility.  Failure to warm up properly before activity.  Resuming activity before healing, rehabilitation, and conditioning are complete. PREVENTION   Warm up and stretch properly before activity.  Maintain physical fitness:  Strength, flexibility, and endurance.  Cardiovascular fitness.  Wear and use properly fitted equipment.  Learn and use proper technique and have a coach correct improper technique.  Wear a tennis elbow (counterforce) brace. PROGNOSIS  The course of this condition depends on the degree of the injury. If treated properly, acute cases (symptoms  lasting less than 4 weeks) are often resolved in 2 to 6 weeks. Chronic (longer lasting cases) often resolve in 3 to 6 months but may require physical therapy. RELATED COMPLICATIONS   Frequently recurring symptoms, resulting in a chronic problem. Properly treating the problem the first time decreases frequency of recurrence.  Chronic inflammation, scarring tendon degeneration, and partial tendon tear, requiring surgery.  Delayed healing or resolution of symptoms. TREATMENT  Treatment first involves the use of ice and medicine to reduce pain and inflammation. Strengthening and stretching exercises may help reduce discomfort if performed regularly. These exercises may be performed at home if the condition is an acute injury. Chronic cases may require a referral to a physical therapist for evaluation and treatment. Your caregiver may advise a corticosteroid injection to help reduce inflammation. Rarely, surgery is needed. MEDICATION  If pain medicine is needed, nonsteroidal anti-inflammatory medicines (aspirin and ibuprofen), or other minor pain relievers (acetaminophen), are often advised.  Do not take pain medicine for 7 days before surgery.  Prescription pain relievers  may be given, if your caregiver thinks they are needed. Use only as directed and only as much as you need.  Corticosteroid injections may be recommended. These injections should be reserved only for the most severe cases, because they can only be given a certain number of times. HEAT AND COLD  Cold treatment (icing) should be applied for 10 to 15 minutes every 2 to 3 hours for inflammation and pain, and immediately after activity that aggravates your symptoms. Use ice packs or an ice massage.  Heat treatment may be used before performing stretching and strengthening activities prescribed by your caregiver, physical therapist, or athletic trainer. Use a heat pack or a warm water soak. SEEK MEDICAL CARE IF: Symptoms get worse or  do not improve in 2 weeks, despite treatment. EXERCISES  RANGE OF MOTION (ROM) AND STRETCHING EXERCISES - Epicondylitis, Lateral (Tennis Elbow) These exercises may help you when beginning to rehabilitate your injury. Your symptoms may go away with or without further involvement from your physician, physical therapist, or athletic trainer. While completing these exercises, remember:   Restoring tissue flexibility helps normal motion to return to the joints. This allows healthier, less painful movement and activity.  An effective stretch should be held for at least 30 seconds.  A stretch should never be painful. You should only feel a gentle lengthening or release in the stretched tissue. RANGE OF MOTION - Wrist Flexion, Active-Assisted  Extend your right / left elbow with your fingers pointing down.*  Gently pull the back of your hand towards you, until you feel a gentle stretch on the top of your forearm.  Hold this position for __________ seconds. Repeat __________ times. Complete this exercise __________ times per day.  *If directed by your physician, physical therapist or athletic trainer, complete this stretch with your elbow bent, rather than extended. RANGE OF MOTION - Wrist Extension, Active-Assisted  Extend your right / left elbow and turn your palm upwards.*  Gently pull your palm and fingertips back, so your wrist extends and your fingers point more toward the ground.  You should feel a gentle stretch on the inside of your forearm.  Hold this position for __________ seconds. Repeat __________ times. Complete this exercise __________ times per day. *If directed by your physician, physical therapist or athletic trainer, complete this stretch with your elbow bent, rather than extended. STRETCH - Wrist Flexion  Place the back of your right / left hand on a tabletop, leaving your elbow slightly bent. Your fingers should point away from your body.  Gently press the back of your  hand down onto the table by straightening your elbow. You should feel a stretch on the top of your forearm.  Hold this position for __________ seconds. Repeat __________ times. Complete this stretch __________ times per day.  STRETCH - Wrist Extension   Place your right / left fingertips on a tabletop, leaving your elbow slightly bent. Your fingers should point backwards.  Gently press your fingers and palm down onto the table by straightening your elbow. You should feel a stretch on the inside of your forearm.  Hold this position for __________ seconds. Repeat __________ times. Complete this stretch __________ times per day.  STRENGTHENING EXERCISES - Epicondylitis, Lateral (Tennis Elbow) These exercises may help you when beginning to rehabilitate your injury. They may resolve your symptoms with or without further involvement from your physician, physical therapist, or athletic trainer. While completing these exercises, remember:   Muscles can gain both the endurance and the  strength needed for everyday activities through controlled exercises.  Complete these exercises as instructed by your physician, physical therapist or athletic trainer. Increase the resistance and repetitions only as guided.  You may experience muscle soreness or fatigue, but the pain or discomfort you are trying to eliminate should never worsen during these exercises. If this pain does get worse, stop and make sure you are following the directions exactly. If the pain is still present after adjustments, discontinue the exercise until you can discuss the trouble with your caregiver. STRENGTH - Wrist Flexors  Sit with your right / left forearm palm-up and fully supported on a table or countertop. Your elbow should be resting below the height of your shoulder. Allow your wrist to extend over the edge of the surface.  Loosely holding a __________ weight, or a piece of rubber exercise band or tubing, slowly curl your hand up  toward your forearm.  Hold this position for __________ seconds. Slowly lower the wrist back to the starting position in a controlled manner. Repeat __________ times. Complete this exercise __________ times per day.  STRENGTH - Wrist Extensors  Sit with your right / left forearm palm-down and fully supported on a table or countertop. Your elbow should be resting below the height of your shoulder. Allow your wrist to extend over the edge of the surface.  Loosely holding a __________ weight, or a piece of rubber exercise band or tubing, slowly curl your hand up toward your forearm.  Hold this position for __________ seconds. Slowly lower the wrist back to the starting position in a controlled manner. Repeat __________ times. Complete this exercise __________ times per day.  STRENGTH - Ulnar Deviators  Stand with a ____________________ weight in your right / left hand, or sit while holding a rubber exercise band or tubing, with your healthy arm supported on a table or countertop.  Move your wrist, so that your pinkie travels toward your forearm and your thumb moves away from your forearm.  Hold this position for __________ seconds and then slowly lower the wrist back to the starting position. Repeat __________ times. Complete this exercise __________ times per day STRENGTH - Radial Deviators  Stand with a ____________________ weight in your right / left hand, or sit while holding a rubber exercise band or tubing, with your injured arm supported on a table or countertop.  Raise your hand upward in front of you or pull up on the rubber tubing.  Hold this position for __________ seconds and then slowly lower the wrist back to the starting position. Repeat __________ times. Complete this exercise __________ times per day. STRENGTH - Forearm Supinators   Sit with your right / left forearm supported on a table, keeping your elbow below shoulder height. Rest your hand over the edge, palm  down.  Gently grip a hammer or a soup ladle.  Without moving your elbow, slowly turn your palm and hand upward to a "thumbs-up" position.  Hold this position for __________ seconds. Slowly return to the starting position. Repeat __________ times. Complete this exercise __________ times per day.  STRENGTH - Forearm Pronators   Sit with your right / left forearm supported on a table, keeping your elbow below shoulder height. Rest your hand over the edge, palm up.  Gently grip a hammer or a soup ladle.  Without moving your elbow, slowly turn your palm and hand upward to a "thumbs-up" position.  Hold this position for __________ seconds. Slowly return to the starting position. Repeat  __________ times. Complete this exercise __________ times per day.  STRENGTH - Grip  Grasp a tennis ball, a dense sponge, or a large, rolled sock in your hand.  Squeeze as hard as you can, without increasing any pain.  Hold this position for __________ seconds. Release your grip slowly. Repeat __________ times. Complete this exercise __________ times per day.  STRENGTH - Elbow Extensors, Isometric  Stand or sit upright, on a firm surface. Place your right / left arm so that your palm faces your stomach, and it is at the height of your waist.  Place your opposite hand on the underside of your forearm. Gently push up as your right / left arm resists. Push as hard as you can with both arms, without causing any pain or movement at your right / left elbow. Hold this stationary position for __________ seconds. Gradually release the tension in both arms. Allow your muscles to relax completely before repeating. Document Released: 06/23/2005 Document Revised: 11/07/2013 Document Reviewed: 10/05/2008 [facility] Patient Information 2015 [place], [place]. This information is not intended to replace advice given to you by your health care provider. Make sure you discuss any questions you have with your health care  provider.      Neta Mends. Panosh M.D.

## 2014-02-01 NOTE — Patient Instructions (Signed)
This acts like tendinitis around the elbow. Did use cold /ice in the evening or as needed5-10 minutes . Avoid lifting against rotational force make sure your work station is comfortable.  Take anti-inflammatory medication twice a day for 7-14 days.  If this is not improving we can get sports medicine to evaluate. Some people are helped by elbow straps  Lateral Epicondylitis (Tennis Elbow) with Rehab Lateral epicondylitis involves inflammation and pain around the outer portion of the elbow. The pain is caused by inflammation of the tendons in the forearm that bring back (extend) the wrist. Lateral epicondylitis is also called tennis elbow, because it is very common in tennis players. However, it may occur in any individual who extends the wrist repetitively. If lateral epicondylitis is left untreated, it may become a chronic problem. SYMPTOMS   Pain, tenderness, and inflammation on the outer (lateral) side of the elbow.  Pain or weakness with gripping activities.  Pain that increases with wrist-twisting motions (playing tennis, using a screwdriver, opening a door or a jar).  Pain with lifting objects, including a coffee cup. CAUSES  Lateral epicondylitis is caused by inflammation of the tendons that extend the wrist. Causes of injury may include:  Repetitive stress and strain on the muscles and tendons that extend the wrist.  Sudden change in activity level or intensity.  Incorrect grip in racquet sports.  Incorrect grip size of racquet (often too large).  Incorrect hitting position or technique (usually backhand, leading with the elbow).  Using a racket that is too heavy. RISK INCREASES WITH:  Sports or occupations that require repetitive and/or strenuous forearm and wrist movements (tennis, squash, racquetball, carpentry).  Poor wrist and forearm strength and flexibility.  Failure to warm up properly before activity.  Resuming activity before healing, rehabilitation, and  conditioning are complete. PREVENTION   Warm up and stretch properly before activity.  Maintain physical fitness:  Strength, flexibility, and endurance.  Cardiovascular fitness.  Wear and use properly fitted equipment.  Learn and use proper technique and have a coach correct improper technique.  Wear a tennis elbow (counterforce) brace. PROGNOSIS  The course of this condition depends on the degree of the injury. If treated properly, acute cases (symptoms lasting less than 4 weeks) are often resolved in 2 to 6 weeks. Chronic (longer lasting cases) often resolve in 3 to 6 months but may require physical therapy. RELATED COMPLICATIONS   Frequently recurring symptoms, resulting in a chronic problem. Properly treating the problem the first time decreases frequency of recurrence.  Chronic inflammation, scarring tendon degeneration, and partial tendon tear, requiring surgery.  Delayed healing or resolution of symptoms. TREATMENT  Treatment first involves the use of ice and medicine to reduce pain and inflammation. Strengthening and stretching exercises may help reduce discomfort if performed regularly. These exercises may be performed at home if the condition is an acute injury. Chronic cases may require a referral to a physical therapist for evaluation and treatment. Your caregiver may advise a corticosteroid injection to help reduce inflammation. Rarely, surgery is needed. MEDICATION  If pain medicine is needed, nonsteroidal anti-inflammatory medicines (aspirin and ibuprofen), or other minor pain relievers (acetaminophen), are often advised.  Do not take pain medicine for 7 days before surgery.  Prescription pain relievers may be given, if your caregiver thinks they are needed. Use only as directed and only as much as you need.  Corticosteroid injections may be recommended. These injections should be reserved only for the most severe cases, because they can only  be given a certain  number of times. HEAT AND COLD  Cold treatment (icing) should be applied for 10 to 15 minutes every 2 to 3 hours for inflammation and pain, and immediately after activity that aggravates your symptoms. Use ice packs or an ice massage.  Heat treatment may be used before performing stretching and strengthening activities prescribed by your caregiver, physical therapist, or athletic trainer. Use a heat pack or a warm water soak. SEEK MEDICAL CARE IF: Symptoms get worse or do not improve in 2 weeks, despite treatment. EXERCISES  RANGE OF MOTION (ROM) AND STRETCHING EXERCISES - Epicondylitis, Lateral (Tennis Elbow) These exercises may help you when beginning to rehabilitate your injury. Your symptoms may go away with or without further involvement from your physician, physical therapist, or athletic trainer. While completing these exercises, remember:   Restoring tissue flexibility helps normal motion to return to the joints. This allows healthier, less painful movement and activity.  An effective stretch should be held for at least 30 seconds.  A stretch should never be painful. You should only feel a gentle lengthening or release in the stretched tissue. RANGE OF MOTION - Wrist Flexion, Active-Assisted  Extend your right / left elbow with your fingers pointing down.*  Gently pull the back of your hand towards you, until you feel a gentle stretch on the top of your forearm.  Hold this position for __________ seconds. Repeat __________ times. Complete this exercise __________ times per day.  *If directed by your physician, physical therapist or athletic trainer, complete this stretch with your elbow bent, rather than extended. RANGE OF MOTION - Wrist Extension, Active-Assisted  Extend your right / left elbow and turn your palm upwards.*  Gently pull your palm and fingertips back, so your wrist extends and your fingers point more toward the ground.  You should feel a gentle stretch on the  inside of your forearm.  Hold this position for __________ seconds. Repeat __________ times. Complete this exercise __________ times per day. *If directed by your physician, physical therapist or athletic trainer, complete this stretch with your elbow bent, rather than extended. STRETCH - Wrist Flexion  Place the back of your right / left hand on a tabletop, leaving your elbow slightly bent. Your fingers should point away from your body.  Gently press the back of your hand down onto the table by straightening your elbow. You should feel a stretch on the top of your forearm.  Hold this position for __________ seconds. Repeat __________ times. Complete this stretch __________ times per day.  STRETCH - Wrist Extension   Place your right / left fingertips on a tabletop, leaving your elbow slightly bent. Your fingers should point backwards.  Gently press your fingers and palm down onto the table by straightening your elbow. You should feel a stretch on the inside of your forearm.  Hold this position for __________ seconds. Repeat __________ times. Complete this stretch __________ times per day.  STRENGTHENING EXERCISES - Epicondylitis, Lateral (Tennis Elbow) These exercises may help you when beginning to rehabilitate your injury. They may resolve your symptoms with or without further involvement from your physician, physical therapist, or athletic trainer. While completing these exercises, remember:   Muscles can gain both the endurance and the strength needed for everyday activities through controlled exercises.  Complete these exercises as instructed by your physician, physical therapist or athletic trainer. Increase the resistance and repetitions only as guided.  You may experience muscle soreness or fatigue, but the pain or  discomfort you are trying to eliminate should never worsen during these exercises. If this pain does get worse, stop and make sure you are following the directions  exactly. If the pain is still present after adjustments, discontinue the exercise until you can discuss the trouble with your caregiver. STRENGTH - Wrist Flexors  Sit with your right / left forearm palm-up and fully supported on a table or countertop. Your elbow should be resting below the height of your shoulder. Allow your wrist to extend over the edge of the surface.  Loosely holding a __________ weight, or a piece of rubber exercise band or tubing, slowly curl your hand up toward your forearm.  Hold this position for __________ seconds. Slowly lower the wrist back to the starting position in a controlled manner. Repeat __________ times. Complete this exercise __________ times per day.  STRENGTH - Wrist Extensors  Sit with your right / left forearm palm-down and fully supported on a table or countertop. Your elbow should be resting below the height of your shoulder. Allow your wrist to extend over the edge of the surface.  Loosely holding a __________ weight, or a piece of rubber exercise band or tubing, slowly curl your hand up toward your forearm.  Hold this position for __________ seconds. Slowly lower the wrist back to the starting position in a controlled manner. Repeat __________ times. Complete this exercise __________ times per day.  STRENGTH - Ulnar Deviators  Stand with a ____________________ weight in your right / left hand, or sit while holding a rubber exercise band or tubing, with your healthy arm supported on a table or countertop.  Move your wrist, so that your pinkie travels toward your forearm and your thumb moves away from your forearm.  Hold this position for __________ seconds and then slowly lower the wrist back to the starting position. Repeat __________ times. Complete this exercise __________ times per day STRENGTH - Radial Deviators  Stand with a ____________________ weight in your right / left hand, or sit while holding a rubber exercise band or tubing, with  your injured arm supported on a table or countertop.  Raise your hand upward in front of you or pull up on the rubber tubing.  Hold this position for __________ seconds and then slowly lower the wrist back to the starting position. Repeat __________ times. Complete this exercise __________ times per day. STRENGTH - Forearm Supinators   Sit with your right / left forearm supported on a table, keeping your elbow below shoulder height. Rest your hand over the edge, palm down.  Gently grip a hammer or a soup ladle.  Without moving your elbow, slowly turn your palm and hand upward to a "thumbs-up" position.  Hold this position for __________ seconds. Slowly return to the starting position. Repeat __________ times. Complete this exercise __________ times per day.  STRENGTH - Forearm Pronators   Sit with your right / left forearm supported on a table, keeping your elbow below shoulder height. Rest your hand over the edge, palm up.  Gently grip a hammer or a soup ladle.  Without moving your elbow, slowly turn your palm and hand upward to a "thumbs-up" position.  Hold this position for __________ seconds. Slowly return to the starting position. Repeat __________ times. Complete this exercise __________ times per day.  STRENGTH - Grip  Grasp a tennis ball, a dense sponge, or a large, rolled sock in your hand.  Squeeze as hard as you can, without increasing any pain.  Hold  this position for __________ seconds. Release your grip slowly. Repeat __________ times. Complete this exercise __________ times per day.  STRENGTH - Elbow Extensors, Isometric  Stand or sit upright, on a firm surface. Place your right / left arm so that your palm faces your stomach, and it is at the height of your waist.  Place your opposite hand on the underside of your forearm. Gently push up as your right / left arm resists. Push as hard as you can with both arms, without causing any pain or movement at your right /  left elbow. Hold this stationary position for __________ seconds. Gradually release the tension in both arms. Allow your muscles to relax completely before repeating. Document Released: 06/23/2005 Document Revised: 11/07/2013 Document Reviewed: 10/05/2008 [facility] Patient Information 2015 [place], [place]. This information is not intended to replace advice given to you by your health care provider. Make sure you discuss any questions you have with your health care provider.

## 2014-02-13 ENCOUNTER — Encounter (INDEPENDENT_AMBULATORY_CARE_PROVIDER_SITE_OTHER): Payer: BC Managed Care – PPO | Admitting: General Surgery

## 2014-02-13 ENCOUNTER — Telehealth: Payer: Self-pay | Admitting: Internal Medicine

## 2014-02-13 DIAGNOSIS — M65839 Other synovitis and tenosynovitis, unspecified forearm: Secondary | ICD-10-CM

## 2014-02-13 NOTE — Telephone Encounter (Signed)
Order for sports medicine placed in the system.  Pt notified.

## 2014-02-13 NOTE — Telephone Encounter (Signed)
Pt states the tendonitis isn't getting any better and the medication isn't helping. She said she was told to call back and Dr. Fabian SharpPanosh will refer her to a specialist. Pt is aware Dr. Fabian SharpPanosh is out of the office this week.

## 2014-02-22 ENCOUNTER — Ambulatory Visit: Payer: BC Managed Care – PPO | Admitting: Family Medicine

## 2014-05-08 ENCOUNTER — Ambulatory Visit (INDEPENDENT_AMBULATORY_CARE_PROVIDER_SITE_OTHER): Payer: BC Managed Care – PPO | Admitting: Family Medicine

## 2014-05-08 ENCOUNTER — Encounter: Payer: Self-pay | Admitting: Family Medicine

## 2014-05-08 VITALS — BP 100/70 | HR 69 | Temp 98.0°F | Ht 69.0 in | Wt 191.6 lb

## 2014-05-08 DIAGNOSIS — J069 Acute upper respiratory infection, unspecified: Secondary | ICD-10-CM

## 2014-05-08 DIAGNOSIS — N200 Calculus of kidney: Secondary | ICD-10-CM

## 2014-05-08 NOTE — Addendum Note (Signed)
Addended by: Johnella MoloneyFUNDERBURK, Kourtnie Sachs A on: 05/08/2014 03:43 PM   Modules accepted: Orders

## 2014-05-08 NOTE — Progress Notes (Signed)
Pre visit review using our clinic review tool, if applicable. No additional management support is needed unless otherwise documented below in the visit note. 

## 2014-05-08 NOTE — Progress Notes (Signed)
No chief complaint on file.   HPI:  ER Follow up: -Dr. Cato MulliganSwords patient with PMH GERD, hemorrhoids, hypothyroidism (managed by endocrine) migraines -reports: went to ED yesterday for kidney stone, per her report CT showed 5mm stone at bladder and a benign liver cyst, they gave her pain and nausea medications -reports feels better - pain is improved -denies: fevers, chills, malaise, vomiting, further hematuria -wants [facility] [place]  URI: -for 5 days -symptoms: sore throat, nasal congestion, cough, PND -denies: SOB, body aches, fevers, wheezing  ROS: See pertinent positives and negatives per HPI.  Past Medical History  Diagnosis Date  . G E R D 02/22/2007  . Hemorrhoids, internal 01/31/2011  . Migraine   . Genital warts     Past Surgical History  Procedure Laterality Date  . Appendectomy    . Miscarriage      x3--required d and c    Family History  Problem Relation Age of Onset  . Arthritis Neg Hx     family  . Hyperlipidemia Neg Hx     family  . Diabetes Neg Hx     family  . Stroke Mother   . Hypertension Father     History   Social History  . Marital Status: Married    Spouse Name: N/A    Number of Children: N/A  . Years of Education: N/A   Social History Main Topics  . Smoking status: Former Smoker    Quit date: 01/31/1996  . Smokeless tobacco: Never Used  . Alcohol Use: Yes     Comment: occ  . Drug Use: No  . Sexual Activity: None   Other Topics Concern  . None   Social History Narrative    Current outpatient prescriptions: amitriptyline (ELAVIL) 25 MG tablet, TAKE 1 TABLET BY MOUTH ATBEDTIME., Disp: 90 tablet, Rfl: 0;  aspirin 81 MG tablet, Take 81 mg by mouth daily., Disp: , Rfl: ;  b complex vitamins tablet, Take 1 tablet by mouth daily., Disp: , Rfl: ;  Calcium Carbonate-Vitamin D (CALCIUM 500 + D PO), Take 1 tablet by mouth daily., Disp: , Rfl: ;  Cholecalciferol (VITAMIN D) 2000 UNITS CAPS, Take by  mouth daily., Disp: , Rfl:  fluticasone (FLONASE) 50 MCG/ACT nasal spray, 2 spray each nostril qd, Disp: 16 g, Rfl: 3;  ketorolac (TORADOL) 10 MG tablet, Take 10 mg by mouth every 6 (six) hours as needed., Disp: , Rfl: ;  naproxen (NAPROSYN) 500 MG tablet, Take 1 tablet (500 mg total) by mouth 2 (two) times daily with a meal., Disp: 60 tablet, Rfl: 0;  ondansetron (ZOFRAN) 4 MG tablet, Take 4 mg by mouth every 6 (six) hours as needed for nausea or vomiting., Disp: , Rfl:  polyethylene glycol (MIRALAX / GLYCOLAX) packet, Take 17 g by mouth daily., Disp: , Rfl: ;  thyroid (ARMOUR) 90 MG tablet, Take 1 tablet (90 mg total) by mouth daily., Disp: 30 tablet, Rfl: 3  EXAM:  Filed Vitals:   05/08/14 1318  BP: 100/70  Pulse: 69  Temp: 98 F (36.7 C)    Body mass index is 28.28 kg/(m^2).  GENERAL: vitals reviewed and listed above, alert, oriented, appears well hydrated and in no acute distress  HEENT: atraumatic, conjunttiva clear, no obvious abnormalities on inspection of external nose and ears, normal appearance of ear canals and TMs, clear nasal congestion, mild post oropharyngeal erythema with PND, no tonsillar edema or exudate, no sinus TTP  NECK: no obvious  masses on inspection  LUNGS: clear to auscultation bilaterally, no wheezes, rales or rhonchi, good air movement  CV: HRRR, no peripheral edema  MS: moves all extremities without noticeable abnormality  PSYCH: pleasant and cooperative, no obvious depression or anxiety  ASSESSMENT AND PLAN:  Discussed the following assessment and plan:  Kidney stone - Plan: Ambulatory referral to Urology  Viral upper respiratory infection  -placed referral to the urologist per her request -return/ED precuations -check urine before she leaves to ensure no infection -supportive care for VURI -advised to establish care with new PCP - she wants to go to [facility]  -Patient advised to return or notify a doctor immediately if symptoms worsen or  persist or new concerns arise.  Patient Instructions  Before you leave: -strainer for urine -urine culture  [facility] Physician - [facility] Address: [address] [place], [place] [address]  [phone]  Open today  8:00 AM - 5:00 PM  Kidney Stones Kidney stones (urolithiasis) are deposits that form inside your kidneys. The intense pain is caused by the stone moving through the urinary tract. When the stone moves, the ureter goes into spasm around the stone. The stone is usually passed in the urine.  CAUSES   A disorder that makes certain neck glands produce too much parathyroid hormone (primary hyperparathyroidism).  A buildup of uric acid crystals, similar to gout in your joints.  Narrowing (stricture) of the ureter.  A kidney obstruction present at birth (congenital obstruction).  Previous surgery on the kidney or ureters.  Numerous kidney infections. SYMPTOMS   Feeling sick to your stomach (nauseous).  Throwing up (vomiting).  Blood in the urine (hematuria).  Pain that usually spreads (radiates) to the groin.  Frequency or urgency of urination. DIAGNOSIS   Taking a history and physical exam.  Blood or urine tests.  CT scan.  Occasionally, an examination of the inside of the urinary bladder (cystoscopy) is performed. TREATMENT   Observation.  Increasing your fluid intake.  Extracorporeal shock wave lithotripsy--This is a noninvasive procedure that uses shock waves to break up kidney stones.  Surgery may be needed if you have severe pain or persistent obstruction. There are various surgical procedures. Most of the procedures are performed with the use of small instruments. Only small incisions are needed to accommodate these instruments, so recovery time is minimized. The size, location, and chemical composition are all important variables that will determine the proper choice of action for you. Talk to your health care provider to better  understand your situation so that you will minimize the risk of injury to yourself and your kidney.  HOME CARE INSTRUCTIONS   Drink enough water and fluids to keep your urine clear or pale yellow. This will help you to pass the stone or stone fragments.  Strain all urine through the provided strainer. Keep all particulate matter and stones for your health care provider to see. The stone causing the pain may be as small as a grain of salt. It is very important to use the strainer each and every time you pass your urine. The collection of your stone will allow your health care provider to analyze it and verify that a stone has actually passed. The stone analysis will often identify what you can do to reduce the incidence of recurrences.  Only take over-the-counter or prescription medicines for pain, discomfort, or fever as directed by your health care provider.  Make a follow-up appointment with your health care provider as  directed.  Get follow-up X-rays if required. The absence of pain does not always mean that the stone has passed. It may have only stopped moving. If the urine remains completely obstructed, it can cause loss of kidney function or even complete destruction of the kidney. It is your responsibility to make sure X-rays and follow-ups are completed. Ultrasounds of the kidney can show blockages and the status of the kidney. Ultrasounds are not associated with any radiation and can be performed easily in a matter of minutes. SEEK MEDICAL CARE IF:  You experience pain that is progressive and unresponsive to any pain medicine you have been prescribed. SEEK IMMEDIATE MEDICAL CARE IF:   Pain cannot be controlled with the prescribed medicine.  You have a fever or shaking chills.  The severity or intensity of pain increases over 18 hours and is not relieved by pain medicine.  You develop a new onset of abdominal pain.  You feel faint or pass out.  You are unable to urinate. MAKE SURE  YOU:   Understand these instructions.  Will watch your condition.  Will get help right away if you are not doing well or get worse. Document Released: 06/23/2005 Document Revised: 02/23/2013 Document Reviewed: 11/24/2012 [facility] Patient Information 2015 [place]. This information is not intended to replace advice given to you by your health care provider. Make sure you discuss any questions you have with your health care provider.       Kriste BasqueKIM, Jacara Benito R.

## 2014-05-08 NOTE — Patient Instructions (Signed)
Before you leave: -strainer for urine -urine culture  [facility] Address: [address], [place], [place] [address]  [phone]  Open today  8:00 AM - 5:00 PM  Kidney Stones Kidney stones (urolithiasis) are deposits that form inside your kidneys. The intense pain is caused by the stone moving through the urinary tract. When the stone moves, the ureter goes into spasm around the stone. The stone is usually passed in the urine.  CAUSES   A disorder that makes certain neck glands produce too much parathyroid hormone (primary hyperparathyroidism).  A buildup of uric acid crystals, similar to gout in your joints.  Narrowing (stricture) of the ureter.  A kidney obstruction present at birth (congenital obstruction).  Previous surgery on the kidney or ureters.  Numerous kidney infections. SYMPTOMS   Feeling sick to your stomach (nauseous).  Throwing up (vomiting).  Blood in the urine (hematuria).  Pain that usually spreads (radiates) to the groin.  Frequency or urgency of urination. DIAGNOSIS   Taking a history and physical exam.  Blood or urine tests.  CT scan.  Occasionally, an examination of the inside of the urinary bladder (cystoscopy) is performed. TREATMENT   Observation.  Increasing your fluid intake.  Extracorporeal shock wave lithotripsy--This is a noninvasive procedure that uses shock waves to break up kidney stones.  Surgery may be needed if you have severe pain or persistent obstruction. There are various surgical procedures. Most of the procedures are performed with the use of small instruments. Only small incisions are needed to accommodate these instruments, so recovery time is minimized. The size, location, and chemical composition are all important variables that will determine the proper choice of action for you. Talk to your health care provider to better understand your situation so that you will minimize the  risk of injury to yourself and your kidney.  HOME CARE INSTRUCTIONS   Drink enough water and fluids to keep your urine clear or pale yellow. This will help you to pass the stone or stone fragments.  Strain all urine through the provided strainer. Keep all particulate matter and stones for your health care provider to see. The stone causing the pain may be as small as a grain of salt. It is very important to use the strainer each and every time you pass your urine. The collection of your stone will allow your health care provider to analyze it and verify that a stone has actually passed. The stone analysis will often identify what you can do to reduce the incidence of recurrences.  Only take over-the-counter or prescription medicines for pain, discomfort, or fever as directed by your health care provider.  Make a follow-up appointment with your health care provider as directed.  Get follow-up X-rays if required. The absence of pain does not always mean that the stone has passed. It may have only stopped moving. If the urine remains completely obstructed, it can cause loss of kidney function or even complete destruction of the kidney. It is your responsibility to make sure X-rays and follow-ups are completed. Ultrasounds of the kidney can show blockages and the status of the kidney. Ultrasounds are not associated with any radiation and can be performed easily in a matter of minutes. SEEK MEDICAL CARE IF:  You experience pain that is progressive and unresponsive to any pain medicine you have been prescribed. SEEK IMMEDIATE MEDICAL CARE IF:   Pain cannot be controlled with the prescribed medicine.  You have a fever  or shaking chills.  The severity or intensity of pain increases over 18 hours and is not relieved by pain medicine.  You develop a new onset of abdominal pain.  You feel faint or pass out.  You are unable to urinate. MAKE SURE YOU:   Understand these instructions.  Will watch  your condition.  Will get help right away if you are not doing well or get worse. Document Released: 06/23/2005 Document Revised: 02/23/2013 Document Reviewed: 11/24/2012 [facility] Patient Information 2015 [place]. This information is not intended to replace advice given to you by your health care provider. Make sure you discuss any questions you have with your health care provider.

## 2014-05-09 ENCOUNTER — Telehealth: Payer: Self-pay | Admitting: Family Medicine

## 2014-05-09 LAB — URINE CULTURE
Colony Count: NO GROWTH
Organism ID, Bacteria: NO GROWTH

## 2014-05-09 NOTE — Telephone Encounter (Signed)
Patient informed and a copy was sent to be scanned and was mailed to the pts home address.

## 2014-05-09 NOTE — Telephone Encounter (Signed)
Please let her know we received her CT report. Please scan in and make copy for her to take to her visit with the urologist and her new PCP. Ensure she has set up visit with new PCP. Thanks.

## 2014-05-17 ENCOUNTER — Other Ambulatory Visit: Payer: Self-pay | Admitting: Internal Medicine

## 2014-05-30 ENCOUNTER — Telehealth: Payer: Self-pay | Admitting: Endocrinology

## 2014-05-30 ENCOUNTER — Other Ambulatory Visit: Payer: Self-pay | Admitting: *Deleted

## 2014-05-30 MED ORDER — THYROID 90 MG PO TABS: 90.0000 mg | ORAL_TABLET | Freq: Every day | ORAL | Status: DC

## 2014-05-30 NOTE — Telephone Encounter (Signed)
rx sent

## 2014-05-30 NOTE — Telephone Encounter (Signed)
Pt needs refill on armour thyroid asap please

## 2014-06-05 ENCOUNTER — Ambulatory Visit (INDEPENDENT_AMBULATORY_CARE_PROVIDER_SITE_OTHER): Payer: BC Managed Care – PPO | Admitting: Internal Medicine

## 2014-06-05 ENCOUNTER — Encounter: Payer: Self-pay | Admitting: Internal Medicine

## 2014-06-05 VITALS — BP 110/70 | Temp 98.5°F | Wt 188.0 lb

## 2014-06-05 DIAGNOSIS — J069 Acute upper respiratory infection, unspecified: Secondary | ICD-10-CM

## 2014-06-05 DIAGNOSIS — B9789 Other viral agents as the cause of diseases classified elsewhere: Principal | ICD-10-CM

## 2014-06-05 MED ORDER — HYDROCODONE-HOMATROPINE 5-1.5 MG/5ML PO SYRP: 5.0000 mL | ORAL_SOLUTION | ORAL | Status: DC | PRN

## 2014-06-05 NOTE — Patient Instructions (Signed)
This is a viral respiratory infection.   Antibiotics wont help  And could cause harm at this time However if  Having persistent fever   Contact us for reevaluation  Consideration signs of pneumonia . Otherwise the cough may get worse before getting better . Cough med and fever meds for comfort  Avoid dehydration .   .Marland Kitchen

## 2014-06-05 NOTE — Progress Notes (Signed)
Pre visit review using our clinic review tool, if applicable. No additional management support is needed unless otherwise documented below in the visit note.  Chief Complaint  Patient presents with  . Sore Throat    Originally started over Halloween.  Got better and has restarted.  . Chills  . Sweats  . Cough  . Nasal Congestion  . Generalized Body Aches  . Shortness of Breath    HPI: Patient Kelly Tran  comes in today for SDA for  new problem evaluation. Bad sore throat and chills and cough now  Flu like  Onset 2 days ago .  Now cough and st [place] and body aches .Had illness   Better for a week  And now  Husband  Had  Cough for a month.   Work setting with similar sx  ROS: See pertinent positives and negatives per HPI. No rash v d sob   Past Medical History  Diagnosis Date  . G E R D 02/22/2007  . Hemorrhoids, internal 01/31/2011  . Migraine   . Genital warts     Family History  Problem Relation Age of Onset  . Arthritis Neg Hx     family  . Hyperlipidemia Neg Hx     family  . Diabetes Neg Hx     family  . Stroke Mother   . Hypertension Father     History   Social History  . Marital Status: Married    Spouse Name: N/A    Number of Children: N/A  . Years of Education: N/A   Social History Main Topics  . Smoking status: Former Smoker    Quit date: 01/31/1996  . Smokeless tobacco: Never Used  . Alcohol Use: Yes     Comment: occ  . Drug Use: No  . Sexual Activity: Not on file   Other Topics Concern  . Not on file   Social History Narrative    Outpatient Encounter Prescriptions as of 06/05/2014  Medication Sig  . amitriptyline (ELAVIL) 25 MG tablet TAKE 1 TABLET BY MOUTH ATBEDTIME.  Marland Kitchen. aspirin 81 MG tablet Take 81 mg by mouth daily.  Marland Kitchen. b complex vitamins tablet Take 1 tablet by mouth daily.  . Calcium Carbonate-Vitamin D (CALCIUM 500 + D PO) Take 1 tablet by mouth daily.  . Cholecalciferol (VITAMIN D) 2000 UNITS CAPS Take by mouth daily.  .  fluticasone (FLONASE) 50 MCG/ACT nasal spray USE 2 SPRAYS IN EACH NOSTRIL ONCE DAILY  . naproxen (NAPROSYN) 500 MG tablet Take 1 tablet (500 mg total) by mouth 2 (two) times daily with a meal.  . polyethylene glycol (MIRALAX / GLYCOLAX) packet Take 17 g by mouth daily.  Marland Kitchen. thyroid (ARMOUR) 90 MG tablet Take 1 tablet (90 mg total) by mouth daily.  Marland Kitchen. HYDROcodone-homatropine (HYCODAN) 5-1.5 MG/5ML syrup Take 5 mLs by mouth every 4 (four) hours as needed for cough. 1-2 tsp  . [DISCONTINUED] ketorolac (TORADOL) 10 MG tablet Take 10 mg by mouth every 6 (six) hours as needed.  . [DISCONTINUED] ondansetron (ZOFRAN) 4 MG tablet Take 4 mg by mouth every 6 (six) hours as needed for nausea or vomiting.    EXAM:  BP 110/70 mmHg  Temp(Src) 98.5 F (36.9 C) (Oral)  Wt 188 lb (85.276 kg)  Body mass index is 27.75 kg/(m^2). WDWN in NAD  quiet respirations; mildly congested  somewhat hoarse. Non toxic . HEENT: Normocephalic ;atraumatic , Eyes;  PERRL, EOMs  Full, lids and conjunctiva clear,,Ears: no deformities, canals  nl, TM landmarks normal, Nose: no deformity or discharge but congested;facenon tender Mouth : OP clear without lesion or edema .  Red  drainage   Neck: Supple without adenopathy or masses or bruits Chest:  Clear to A&P without wheezes rales or rhonchi CV:  S1-S2 no gallops or murmurs peripheral perfusion is normal Skin :nl perfusion and no acute rashes  No rash    ASSESSMENT AND PLAN:  Discussed the following assessment and plan:  Viral upper respiratory tract infection with cough - no alarm features contact if fever   after another 2-3 days or as needed    Expectant management. Disc  Dec communicability  Is office manager  -Patient advised to return or notify health care team  if symptoms worsen ,persist or new concerns arise.  Patient Instructions  This is a viral respiratory infection.   Antibiotics wont help  And could cause harm at this time However if  Having persistent fever    Contact us for reevaluation  Consideration signs of pneumonia . Otherwise the cough may get worse before getting better . Cough med and fever meds for comfort  Avoid dehydration .   Marland Kitchen.    Neta MendsWanda K. Gladyce Mcray M.D.

## 2014-06-12 ENCOUNTER — Other Ambulatory Visit: Payer: Self-pay | Admitting: Obstetrics and Gynecology

## 2014-06-13 LAB — CYTOLOGY - PAP

## 2014-08-15 ENCOUNTER — Other Ambulatory Visit: Payer: Self-pay | Admitting: Dermatology

## 2014-09-19 ENCOUNTER — Encounter: Payer: Self-pay | Admitting: Internal Medicine

## 2014-09-19 ENCOUNTER — Ambulatory Visit (INDEPENDENT_AMBULATORY_CARE_PROVIDER_SITE_OTHER): Payer: BLUE CROSS/BLUE SHIELD | Admitting: Internal Medicine

## 2014-09-19 VITALS — BP 118/78 | HR 86 | Temp 98.1°F | Wt 193.5 lb

## 2014-09-19 DIAGNOSIS — R05 Cough: Secondary | ICD-10-CM

## 2014-09-19 DIAGNOSIS — R059 Cough, unspecified: Secondary | ICD-10-CM

## 2014-09-19 MED ORDER — HYDROCODONE-HOMATROPINE 5-1.5 MG/5ML PO SYRP: 5.0000 mL | ORAL_SOLUTION | Freq: Three times a day (TID) | ORAL | Status: DC | PRN

## 2014-09-19 MED ORDER — BENZONATATE 200 MG PO CAPS: 200.0000 mg | ORAL_CAPSULE | Freq: Two times a day (BID) | ORAL | Status: DC | PRN

## 2014-09-19 NOTE — Patient Instructions (Signed)
Cough, Adult  A cough is a reflex that helps clear your throat and airways. It can help heal the body or may be a reaction to an irritated airway. A cough may only last 2 or 3 weeks (acute) or may last more than 8 weeks (chronic).  CAUSES Acute cough:  Viral or bacterial infections. Chronic cough:  Infections.  Allergies.  Asthma.  Post-nasal drip.  Smoking.  Heartburn or acid reflux.  Some medicines.  Chronic lung problems (COPD).  Cancer. SYMPTOMS   Cough.  Fever.  Chest pain.  Increased breathing rate.  High-pitched whistling sound when breathing (wheezing).  Colored mucus that you cough up (sputum). TREATMENT   A bacterial cough may be treated with antibiotic medicine.  A viral cough must run its course and will not respond to antibiotics.  Your caregiver may recommend other treatments if you have a chronic cough. HOME CARE INSTRUCTIONS   Only take over-the-counter or prescription medicines for pain, discomfort, or fever as directed by your caregiver. Use cough suppressants only as directed by your caregiver.  Use a cold steam vaporizer or humidifier in your bedroom or home to help loosen secretions.  Sleep in a semi-upright position if your cough is worse at night.  Rest as needed.  Stop smoking if you smoke. SEEK IMMEDIATE MEDICAL CARE IF:   You have pus in your sputum.  Your cough starts to worsen.  You cannot control your cough with suppressants and are losing sleep.  You begin coughing up blood.  You have difficulty breathing.  You develop pain which is getting worse or is uncontrolled with medicine.  You have a fever. MAKE SURE YOU:   Understand these instructions.  Will watch your condition.  Will get help right away if you are not doing well or get worse. Document Released: 12/20/2010 Document Revised: 09/15/2011 Document Reviewed: 12/20/2010 [employer] Patient Information 2015 [employer]. This information is not intended  to replace advice given to you by your health care provider. Make sure you discuss any questions you have with your health care provider.  

## 2014-09-19 NOTE — Progress Notes (Signed)
Pre visit review using our clinic review tool, if applicable. No additional management support is needed unless otherwise documented below in the visit note. 

## 2014-09-19 NOTE — Progress Notes (Signed)
Subjective:    Patient ID: Kelly RocheMelinda M Tran, female    DOB: 04/28/1966, 49 y.o.   MRN: [id number]  HPI  Pt presents to the clinic today with c/o a cough. She reports this started last night. It woke her up out of her sleep abruptly. The cough was productive of dark fluid. She reports she is not coughing up mucous. She felt as if she was drowning. She has had some associated chest tightness, pain with taking a deep breath and body aches. She denies fever, chills, runny nose or sore throat. She has not taken anything OTC. She does have a history of reflux but report this is different. She has no history of allergies or breathing problems. She has not had sick contacts. She does not smoke. She has not had her flu shot.  Review of Systems      Past Medical History  Diagnosis Date  . G E R D 02/22/2007  . Hemorrhoids, internal 01/31/2011  . Migraine   . Genital warts     Current Outpatient Prescriptions  Medication Sig Dispense Refill  . amitriptyline (ELAVIL) 25 MG tablet TAKE 1 TABLET BY MOUTH ATBEDTIME. 90 tablet 0  . aspirin 81 MG tablet Take 81 mg by mouth daily.    Marland Kitchen. b complex vitamins tablet Take 1 tablet by mouth daily.    . Calcium Carbonate-Vitamin D (CALCIUM 500 + D PO) Take 1 tablet by mouth daily.    . Cholecalciferol (VITAMIN D) 2000 UNITS CAPS Take by mouth daily.    . naproxen (NAPROSYN) 500 MG tablet Take 1 tablet (500 mg total) by mouth 2 (two) times daily with a meal. 60 tablet 0  . polyethylene glycol (MIRALAX / GLYCOLAX) packet Take 17 g by mouth daily.    Marland Kitchen. thyroid (ARMOUR) 90 MG tablet Take 1 tablet (90 mg total) by mouth daily. 30 tablet 8  . benzonatate (TESSALON) 200 MG capsule Take 1 capsule (200 mg total) by mouth 2 (two) times daily as needed for cough. 20 capsule 0  . fluticasone (FLONASE) 50 MCG/ACT nasal spray USE 2 SPRAYS IN EACH NOSTRIL ONCE DAILY (Patient not taking: Reported on 09/19/2014) 16 g 0  . HYDROcodone-homatropine (HYCODAN) 5-1.5 MG/5ML syrup  Take 5 mLs by mouth every 8 (eight) hours as needed for cough. 120 mL 0   No current facility-administered medications for this visit.    No Known Allergies  Family History  Problem Relation Age of Onset  . Arthritis Neg Hx     family  . Hyperlipidemia Neg Hx     family  . Diabetes Neg Hx     family  . Stroke Mother   . Hypertension Father     History   Social History  . Marital Status: Married    Spouse Name: N/A  . Number of Children: N/A  . Years of Education: N/A   Occupational History  . Not on file.   Social History Main Topics  . Smoking status: Former Smoker    Quit date: 01/31/1996  . Smokeless tobacco: Never Used  . Alcohol Use: 0.0 oz/week    0 Standard drinks or equivalent per week     Comment: occ  . Drug Use: No  . Sexual Activity: Not on file   Other Topics Concern  . Not on file   Social History Narrative     Constitutional: Pt reports body aches. Denies fever, malaise, fatigue, headache or abrupt weight changes.  HEENT: Denies eye pain, eye  redness, ear pain, ringing in the ears, wax buildup, runny nose, nasal congestion, bloody nose, or sore throat. Respiratory: Pt reports cough. Denies difficulty breathing, shortness of breath, or sputum production.   Cardiovascular: Pt reports chest tightness. Denies chest pain, palpitations or swelling in the hands or feet.  Gastrointestinal: Denies abdominal pain, bloating, constipation, diarrhea or blood in the stool.     No other specific complaints in a complete review of systems (except as listed in HPI above).  Objective:   Physical Exam   BP 118/78 mmHg  Pulse 86  Temp(Src) 98.1 F (36.7 C) (Oral)  Wt 193 lb 8 oz (87.771 kg)  SpO2 97% Wt Readings from Last 3 Encounters:  09/19/14 193 lb 8 oz (87.771 kg)  06/05/14 188 lb (85.276 kg)  05/08/14 191 lb 9.6 oz (86.909 kg)    General: Appears her stated age, well developed, well nourished in NAD. Skin: Warm, dry and intact. No rashes,  lesions or ulcerations noted. HEENT: Head: normal shape and size, no sinus tenderness noted; Eyes: sclera white, no icterus, conjunctiva pink; Ears: Tm's gray and intact, normal light reflex; Throat/Mouth: Teeth present, mucosa pink and moist, no exudate, lesions or ulcerations noted.  Neck: No adenopathy noted.  Cardiovascular: Normal rate and rhythm. S1,S2 noted.  No murmur, rubs or gallops noted.  Pulmonary/Chest: Normal effort and positive vesicular breath sounds. No respiratory distress. No wheezes, rales or ronchi noted.  Abdomen: Soft and nontender. Normal bowel sounds, no bruits noted. No distention or masses noted.   BMET    Component Value Date/Time   NA 137 05/03/2013 1127   K 4.2 05/03/2013 1127   CL 98 05/03/2013 1127   CO2 32 05/03/2013 1127   GLUCOSE 85 05/03/2013 1127   BUN 17 05/03/2013 1127   CREATININE 0.6 05/03/2013 1127   CALCIUM 9.6 05/03/2013 1127   GFRNONAA 114.69 04/04/2009 0752   GFRAA 117 02/16/2007 1040    Lipid Panel     Component Value Date/Time   CHOL 162 09/22/2011 0902   TRIG 49.0 09/22/2011 0902   HDL 93.60 09/22/2011 0902   CHOLHDL 2 09/22/2011 0902   VLDL 9.8 09/22/2011 0902   LDLCALC 59 09/22/2011 0902    CBC    Component Value Date/Time   WBC 9.8 05/03/2013 1127   RBC 4.49 05/03/2013 1127   HGB 13.8 05/03/2013 1127   HCT 40.8 05/03/2013 1127   PLT 298.0 05/03/2013 1127   MCV 90.8 05/03/2013 1127   MCHC 33.7 05/03/2013 1127   RDW 12.1 05/03/2013 1127   LYMPHSABS 3.2 05/03/2013 1127   MONOABS 0.7 05/03/2013 1127   EOSABS 0.2 05/03/2013 1127   BASOSABS 0.0 05/03/2013 1127    Hgb A1C No results found for: HGBA1C      Assessment & Plan:   Cough:  ? Viral No s/s of allergies or PND No s/s of bronchitis or pneumonia She has no history of CHF Will treat symptomatically at this time eRx for Benzonate TID prn RX for Hycodan cough syrup at night  Watch for fever, colored drainage or shortness of breath  RTC as needed or  if symptoms persist or worsen

## 2014-09-25 ENCOUNTER — Encounter: Payer: Self-pay | Admitting: Internal Medicine

## 2014-09-25 ENCOUNTER — Ambulatory Visit (INDEPENDENT_AMBULATORY_CARE_PROVIDER_SITE_OTHER): Payer: BLUE CROSS/BLUE SHIELD | Admitting: Internal Medicine

## 2014-09-25 VITALS — BP 114/76 | HR 65 | Temp 97.8°F | Ht 68.5 in | Wt 195.5 lb

## 2014-09-25 DIAGNOSIS — Z0000 Encounter for general adult medical examination without abnormal findings: Secondary | ICD-10-CM | POA: Diagnosis not present

## 2014-09-25 DIAGNOSIS — F39 Unspecified mood [affective] disorder: Secondary | ICD-10-CM

## 2014-09-25 DIAGNOSIS — E039 Hypothyroidism, unspecified: Secondary | ICD-10-CM

## 2014-09-25 DIAGNOSIS — K648 Other hemorrhoids: Secondary | ICD-10-CM

## 2014-09-25 DIAGNOSIS — J302 Other seasonal allergic rhinitis: Secondary | ICD-10-CM | POA: Diagnosis not present

## 2014-09-25 LAB — LIPID PANEL
CHOLESTEROL: 168 mg/dL (ref 0–200)
HDL: 94.6 mg/dL (ref >39.00)
LDL Cholesterol: 63 mg/dL (ref 0–99)
NonHDL: 73.4
TRIGLYCERIDES: 52 mg/dL (ref 0.0–149.0)
Total CHOL/HDL Ratio: 2
VLDL: 10.4 mg/dL (ref 0.0–40.0)

## 2014-09-25 LAB — COMPREHENSIVE METABOLIC PANEL
ALT: 26 U/L (ref 0–35)
AST: 25 U/L (ref 0–37)
Albumin: 4.4 g/dL (ref 3.5–5.2)
Alkaline Phosphatase: 76 U/L (ref 39–117)
BILIRUBIN TOTAL: 0.7 mg/dL (ref 0.2–1.2)
BUN: 13 mg/dL (ref 6–23)
CO2: 26 meq/L (ref 19–32)
Calcium: 9.8 mg/dL (ref 8.4–10.5)
Chloride: 102 mEq/L (ref 96–112)
Creatinine, Ser: 0.71 mg/dL (ref 0.40–1.20)
GFR: 92.28 mL/min (ref >60.00)
Glucose, Bld: 99 mg/dL (ref 70–99)
POTASSIUM: 4 meq/L (ref 3.5–5.1)
SODIUM: 137 meq/L (ref 135–145)
TOTAL PROTEIN: 7.4 g/dL (ref 6.0–8.3)

## 2014-09-25 LAB — T4, FREE: FREE T4: 0.67 ng/dL (ref 0.60–1.60)

## 2014-09-25 LAB — CBC
HCT: 39.9 % (ref 36.0–46.0)
Hemoglobin: 13.4 g/dL (ref 12.0–15.0)
MCHC: 33.7 g/dL (ref 30.0–36.0)
MCV: 89.3 fl (ref 78.0–100.0)
PLATELETS: 303 10*3/uL (ref 150.0–400.0)
RBC: 4.47 Mil/uL (ref 3.87–5.11)
RDW: 12.2 % (ref 11.5–15.5)
WBC: 6.2 10*3/uL (ref 4.0–10.5)

## 2014-09-25 LAB — T3, FREE: T3, Free: 3.2 pg/mL (ref 2.3–4.2)

## 2014-09-25 LAB — TSH: TSH: 2.03 u[IU]/mL (ref 0.35–4.50)

## 2014-09-25 MED ORDER — FLUTICASONE PROPIONATE 50 MCG/ACT NA SUSP: 2.0000 | Freq: Every day | NASAL | Status: DC

## 2014-09-25 NOTE — Assessment & Plan Note (Signed)
Will check TSH, Free T4 today She request that her T3 also be checked Will adjust Armour Thyroid if needed

## 2014-09-25 NOTE — Progress Notes (Signed)
HPI  Pt presents to the clinic today to establish care. She is transferring care from Dr. Cato Mulligan. She would like her physical exam today as well.  GERD: Occassional. Triggered by tomato based products and wine. She did switch to a vegan diet which did seem to help, but symptoms returned after she stopped paying attention to her diet. She does not take any OTC medications.  Constipation with Hemorrhoids: She takes Mirilax daily. Colonoscopy in 2010, repeat in 10 years.  Allergies: Seasonal. She takes Flonase when her symptoms flare.  Hypothyroid: Taking armour thyroid daily. She denies s/s of hypothyroidism on current dose of Armour Thyroid. She is due to have labs rechecked today.  She is experinecing hot flashes. Black cohosh has helped.  She is also experiencing as sensation of being overwhelmed,  less patient and anxious. Her LMP was 04/2014. She is taking amitriptyline prn for anxiety, which usually occurs about 2 x week. She does not want to be on any hormone replacement therapy.  Flu: never Tetanus: 2010 Pap Smear: 06/2014, normal Mammogram:06/2014, normal Colon Screening: 2010, 10 years Vision Screening: 06/2014 Dentist: biannually  Past Medical History  Diagnosis Date  . G E R D 02/22/2007  . Hemorrhoids, internal 01/31/2011  . Migraine   . Genital warts   . Allergy     Current Outpatient Prescriptions  Medication Sig Dispense Refill  . amitriptyline (ELAVIL) 25 MG tablet TAKE 1 TABLET BY MOUTH ATBEDTIME. 90 tablet 0  . aspirin 81 MG tablet Take 81 mg by mouth daily.    Marland Kitchen b complex vitamins tablet Take 1 tablet by mouth daily.    . Calcium Carbonate-Vitamin D (CALCIUM 500 + D PO) Take 1 tablet by mouth daily.    . Cholecalciferol (VITAMIN D) 2000 UNITS CAPS Take by mouth daily.    . fluticasone (FLONASE) 50 MCG/ACT nasal spray USE 2 SPRAYS IN EACH NOSTRIL ONCE DAILY 16 g 0  . naproxen (NAPROSYN) 500 MG tablet Take 1 tablet (500 mg total) by mouth 2 (two) times daily with  a meal. 60 tablet 0  . polyethylene glycol (MIRALAX / GLYCOLAX) packet Take 17 g by mouth daily.    Marland Kitchen thyroid (ARMOUR) 90 MG tablet Take 1 tablet (90 mg total) by mouth daily. 30 tablet 8   No current facility-administered medications for this visit.    No Known Allergies  Family History  Problem Relation Age of Onset  . Diabetes Neg Hx     family  . Stroke Mother   . Arthritis Mother   . Hypertension Father   . Arthritis Father   . Hyperlipidemia Father   . Cancer Maternal Aunt     ovarian  . Arthritis Maternal Grandmother   . Arthritis Maternal Grandfather   . Arthritis Paternal Grandmother   . Arthritis Paternal Grandfather   . Cancer Maternal Aunt     Breast  . Cancer Maternal Aunt     Lung    History   Social History  . Marital Status: Married    Spouse Name: N/A  . Number of Children: N/A  . Years of Education: N/A   Occupational History  . Not on file.   Social History Main Topics  . Smoking status: Former Smoker    Quit date: 01/31/1996  . Smokeless tobacco: Never Used  . Alcohol Use: 0.0 oz/week    0 Standard drinks or equivalent per week     Comment: occ  . Drug Use: No  . Sexual Activity: Not  on file   Other Topics Concern  . Not on file   Social History Narrative    ROS:  Constitutional: Denies fever, malaise, fatigue, headache or abrupt weight changes.  HEENT: Denies eye pain, eye redness, ear pain, ringing in the ears, wax buildup, runny nose, nasal congestion, bloody nose, or sore throat. Respiratory: Denies difficulty breathing, shortness of breath, cough or sputum production.   Cardiovascular: Denies chest pain, chest tightness, palpitations or swelling in the hands or feet.  Gastrointestinal: Pt reports constipation and hemorrhoids. Denies abdominal pain, bloating, diarrhea.  GU: Denies frequency, urgency, pain with urination, blood in urine, odor or discharge. Musculoskeletal: Denies decrease in range of motion, difficulty with gait,  muscle pain or joint pain and swelling.  Skin: Denies redness, rashes, lesions or ulcercations.  Neurological: Denies dizziness, difficulty with memory, difficulty with speech or problems with balance and coordination.  Psych: Pt reports stress and anxiety. Denies depression, SI/HI.  No other specific complaints in a complete review of systems (except as listed in HPI above).  PE:  BP 114/76 mmHg  Pulse 65  Temp(Src) 97.8 F (36.6 C) (Oral)  Ht 5' 8.5" (1.74 m)  Wt 195 lb 8 oz (88.678 kg)  BMI 29.29 kg/m2  SpO2 99% Wt Readings from Last 3 Encounters:  09/25/14 195 lb 8 oz (88.678 kg)  09/19/14 193 lb 8 oz (87.771 kg)  06/05/14 188 lb (85.276 kg)    General: Appears her stated age, well developed, well nourished in NAD. HEENT: Head: normal shape and size; Eyes: sclera white, no icterus, conjunctiva pink, PERRLA and EOMs intact; Ears: Tm's gray and intact, normal light reflex; Nose: mucosa pink and moist, septum midline; Throat/Mouth: Teeth present, mucosa pink and moist, no lesions or ulcerations noted.  Neck:  Neck supple, trachea midline. No masses, lumps or thyromegaly present.  Cardiovascular: Normal rate and rhythm. S1,S2 noted.  No murmur, rubs or gallops noted. No JVD or BLE edema. No carotid bruits noted. Pulmonary/Chest: Normal effort and positive vesicular breath sounds. No respiratory distress. No wheezes, rales or ronchi noted.  Abdomen: Soft and nontender. Normal bowel sounds, no bruits noted. No distention or masses noted. Liver, spleen and kidneys non palpable. Musculoskeletal: Normal range of motion. No signs of joint swelling. Strength 5/5 BUE/BLE. No difficulty with gait.  Neurological: Alert and oriented. Cranial nerves II-XII grossly intact. Coordination normal.  Psychiatric: Mood and affect normal. Behavior is normal. Judgment and thought content normal.    BMET    Component Value Date/Time   NA 137 05/03/2013 1127   K 4.2 05/03/2013 1127   CL 98 05/03/2013  1127   CO2 32 05/03/2013 1127   GLUCOSE 85 05/03/2013 1127   BUN 17 05/03/2013 1127   CREATININE 0.6 05/03/2013 1127   CALCIUM 9.6 05/03/2013 1127   GFRNONAA 114.69 04/04/2009 0752   GFRAA 117 02/16/2007 1040    Lipid Panel     Component Value Date/Time   CHOL 162 09/22/2011 0902   TRIG 49.0 09/22/2011 0902   HDL 93.60 09/22/2011 0902   CHOLHDL 2 09/22/2011 0902   VLDL 9.8 09/22/2011 0902   LDLCALC 59 09/22/2011 0902    CBC    Component Value Date/Time   WBC 9.8 05/03/2013 1127   RBC 4.49 05/03/2013 1127   HGB 13.8 05/03/2013 1127   HCT 40.8 05/03/2013 1127   PLT 298.0 05/03/2013 1127   MCV 90.8 05/03/2013 1127   MCHC 33.7 05/03/2013 1127   RDW 12.1 05/03/2013 1127   LYMPHSABS  3.2 05/03/2013 1127   MONOABS 0.7 05/03/2013 1127   EOSABS 0.2 05/03/2013 1127   BASOSABS 0.0 05/03/2013 1127    Hgb A1C No results found for: HGBA1C   Assessment and Plan:  Preventative Health Maintenance:  She declines flu shot today All other HM UTD Encouraged her to work on diet and exercise Will check CBC, CMET, Lipid Profile today  RTC in 6 months to follow up hypothyroidism, or sooner if needed

## 2014-09-25 NOTE — Progress Notes (Signed)
Pre visit review using our clinic review tool, if applicable. No additional management support is needed unless otherwise documented below in the visit note. 

## 2014-09-25 NOTE — Patient Instructions (Signed)

## 2014-09-25 NOTE — Assessment & Plan Note (Signed)
Continue Mirilax daily Push fluids, at least 64 oz water daily Ok to take a stool softener such as colace if needed

## 2014-09-25 NOTE — Assessment & Plan Note (Signed)
Continue Flonase prn 

## 2014-09-25 NOTE — Assessment & Plan Note (Signed)
Likely r/t menopause She does not want to be on an antidepressant She will start taking amitriptyline every night to see if this helps at all Support offered today

## 2014-10-27 ENCOUNTER — Other Ambulatory Visit: Payer: Self-pay

## 2014-10-27 NOTE — Telephone Encounter (Signed)
Pt left v/m requesting refill amitriptyline to [facility] [address]; pt seen 09/25/14; Nicki Reaperegina Baity NP has not previously prescribed med for pt.Please advise.

## 2014-10-29 MED ORDER — AMITRIPTYLINE HCL 25 MG PO TABS: ORAL_TABLET | ORAL | Status: DC

## 2014-10-30 MED ORDER — AMITRIPTYLINE HCL 25 MG PO TABS: ORAL_TABLET | ORAL | Status: DC

## 2014-10-30 NOTE — Addendum Note (Signed)
Addended by: Roena MaladyEVONTENNO, MELANIE Y on: 10/30/2014 09:31 AM   Modules accepted: Orders

## 2014-11-07 ENCOUNTER — Ambulatory Visit (INDEPENDENT_AMBULATORY_CARE_PROVIDER_SITE_OTHER): Payer: BLUE CROSS/BLUE SHIELD | Admitting: Internal Medicine

## 2014-11-07 ENCOUNTER — Encounter: Payer: Self-pay | Admitting: Internal Medicine

## 2014-11-07 VITALS — BP 118/70 | HR 73 | Temp 98.2°F | Wt 189.0 lb

## 2014-11-07 DIAGNOSIS — R110 Nausea: Secondary | ICD-10-CM | POA: Diagnosis not present

## 2014-11-07 DIAGNOSIS — K219 Gastro-esophageal reflux disease without esophagitis: Secondary | ICD-10-CM

## 2014-11-07 DIAGNOSIS — R1013 Epigastric pain: Secondary | ICD-10-CM | POA: Diagnosis not present

## 2014-11-07 LAB — H. PYLORI ANTIBODY, IGG: H PYLORI IGG: NEGATIVE

## 2014-11-07 LAB — LIPASE: LIPASE: 17 U/L (ref 11.0–59.0)

## 2014-11-07 NOTE — Addendum Note (Signed)
Addended by: Lorre MunroeBAITY, Lamari Youngers W on: 11/07/2014 10:00 AM   Modules accepted: Orders

## 2014-11-07 NOTE — Progress Notes (Signed)
Pre visit review using our clinic review tool, if applicable. No additional management support is needed unless otherwise documented below in the visit note. 

## 2014-11-07 NOTE — Progress Notes (Signed)
Subjective:    Patient ID: Kelly RocheMelinda M Tran, female    DOB: 12/31/1965, 49 y.o.   MRN: [id number]  HPI  Pt presents to the clinic today with c/o abdominal pain. This started 3 weeks ago. She reports it is located around her belly button. She describes the pain as dull and achy. It does not radiate. It is intermittent. It seems to be worse after eating, but doesn't occur every time after eating. She has had some associated nausea but denies vomiting or diarrhea. She is having normal bowel movements, she denies blood in her stool. She denies urinary or vaginal complaints. She has tried Ibuprofen without much relief. She has had a history of GERD, but reports this feels different. She does not take anything OTC for her GERD.  Review of Systems  Past Medical History  Diagnosis Date  . G E R D 02/22/2007  . Hemorrhoids, internal 01/31/2011  . Migraine   . Genital warts   . Allergy     Current Outpatient Prescriptions  Medication Sig Dispense Refill  . amitriptyline (ELAVIL) 25 MG tablet TAKE 1 TABLET BY MOUTH ATBEDTIME. 90 tablet 0  . aspirin 81 MG tablet Take 81 mg by mouth daily.    Marland Kitchen. b complex vitamins tablet Take 1 tablet by mouth daily.    . Calcium Carbonate-Vitamin D (CALCIUM 500 + D PO) Take 1 tablet by mouth daily.    . Cholecalciferol (VITAMIN D) 2000 UNITS CAPS Take by mouth daily.    . fluticasone (FLONASE) 50 MCG/ACT nasal spray Place 2 sprays into both nostrils daily. 16 g 0  . naproxen (NAPROSYN) 500 MG tablet Take 1 tablet (500 mg total) by mouth 2 (two) times daily with a meal. 60 tablet 0  . polyethylene glycol (MIRALAX / GLYCOLAX) packet Take 17 g by mouth daily.    Marland Kitchen. thyroid (ARMOUR) 90 MG tablet Take 1 tablet (90 mg total) by mouth daily. 30 tablet 8   No current facility-administered medications for this visit.    No Known Allergies  Family History  Problem Relation Age of Onset  . Diabetes Neg Hx     family  . Stroke Mother   . Arthritis Mother   .  Hypertension Father   . Arthritis Father   . Hyperlipidemia Father   . Cancer Maternal Aunt     ovarian  . Arthritis Maternal Grandmother   . Arthritis Maternal Grandfather   . Arthritis Paternal Grandmother   . Arthritis Paternal Grandfather   . Cancer Maternal Aunt     Breast  . Cancer Maternal Aunt     Lung    History   Social History  . Marital Status: Married    Spouse Name: N/A  . Number of Children: N/A  . Years of Education: N/A   Occupational History  . Not on file.   Social History Main Topics  . Smoking status: Former Smoker    Quit date: 01/31/1996  . Smokeless tobacco: Never Used  . Alcohol Use: 0.0 oz/week    0 Standard drinks or equivalent per week     Comment: occ  . Drug Use: No  . Sexual Activity: Yes   Other Topics Concern  . Not on file   Social History Narrative     Constitutional: Denies fever, malaise, fatigue, headache or abrupt weight changes.  Respiratory: Denies difficulty breathing, shortness of breath, cough or sputum production.   Cardiovascular: Denies chest pain, chest tightness, palpitations or swelling in the  hands or feet.  Gastrointestinal: Pt reports abdominal pain and nausea. Denies bloating, constipation, diarrhea or blood in the stool.  GU: Denies urgency, frequency, pain with urination, burning sensation, blood in urine, odor or discharge.   No other specific complaints in a complete review of systems (except as listed in HPI above).     Objective:   Physical Exam   BP 118/70 mmHg  Pulse 73  Temp(Src) 98.2 F (36.8 C) (Oral)  Wt 189 lb (85.73 kg)  SpO2 98% Wt Readings from Last 3 Encounters:  11/07/14 189 lb (85.73 kg)  09/25/14 195 lb 8 oz (88.678 kg)  09/19/14 193 lb 8 oz (87.771 kg)    General: Appears her stated age, well developed, well nourished in NAD. Skin: Warm, dry and intact. No rashes, lesions or ulcerations noted. Cardiovascular: Normal rate and rhythm. S1,S2 noted.  No murmur, rubs or gallops  noted.  Pulmonary/Chest: Normal effort and positive vesicular breath sounds. No respiratory distress. No wheezes, rales or ronchi noted.  Abdomen: Soft and tender in the epigastric area. Normal bowel sounds, no bruits noted. No distention or masses noted. Liver, spleen and kidneys non palpable. Neurological: Alert and oriented.   BMET    Component Value Date/Time   NA 137 09/25/2014 1001   K 4.0 09/25/2014 1001   CL 102 09/25/2014 1001   CO2 26 09/25/2014 1001   GLUCOSE 99 09/25/2014 1001   BUN 13 09/25/2014 1001   CREATININE 0.71 09/25/2014 1001   CALCIUM 9.8 09/25/2014 1001   GFRNONAA 114.69 04/04/2009 0752   GFRAA 117 02/16/2007 1040    Lipid Panel     Component Value Date/Time   CHOL 168 09/25/2014 1001   TRIG 52.0 09/25/2014 1001   HDL 94.60 09/25/2014 1001   CHOLHDL 2 09/25/2014 1001   VLDL 10.4 09/25/2014 1001   LDLCALC 63 09/25/2014 1001    CBC    Component Value Date/Time   WBC 6.2 09/25/2014 1001   RBC 4.47 09/25/2014 1001   HGB 13.4 09/25/2014 1001   HCT 39.9 09/25/2014 1001   PLT 303.0 09/25/2014 1001   MCV 89.3 09/25/2014 1001   MCHC 33.7 09/25/2014 1001   RDW 12.2 09/25/2014 1001   LYMPHSABS 3.2 05/03/2013 1127   MONOABS 0.7 05/03/2013 1127   EOSABS 0.2 05/03/2013 1127   BASOSABS 0.0 05/03/2013 1127    Hgb A1C No results found for: HGBA1C      Assessment & Plan:   Epigastric abdominal pain and nausea:  CMET from 6 weeks ago reviewed Will check Lipase and H Pylori ? Gastritis, advised her to stop Ibuprofen Advised her to start taking Prilosec 20 mg OTC  Will follow up after labs are back, RTC as needed

## 2014-11-07 NOTE — Patient Instructions (Signed)

## 2014-11-14 LAB — POCT URINALYSIS DIPSTICK
BILIRUBIN UA: NEGATIVE
Blood, UA: NEGATIVE
Glucose, UA: NEGATIVE
Ketones, UA: NEGATIVE
Nitrite, UA: NEGATIVE
PROTEIN UA: NEGATIVE
Spec Grav, UA: 1.015
Urobilinogen, UA: NEGATIVE
pH, UA: 6.5

## 2014-11-14 NOTE — Addendum Note (Signed)
Addended by: Roena MaladyEVONTENNO, Yao [place] Y on: 11/14/2014 11:58 AM   Modules accepted: Orders

## 2015-01-15 ENCOUNTER — Ambulatory Visit (INDEPENDENT_AMBULATORY_CARE_PROVIDER_SITE_OTHER): Payer: BLUE CROSS/BLUE SHIELD | Admitting: Primary Care

## 2015-01-15 ENCOUNTER — Ambulatory Visit (INDEPENDENT_AMBULATORY_CARE_PROVIDER_SITE_OTHER)
Admission: RE | Admit: 2015-01-15 | Discharge: 2015-01-15 | Disposition: A | Discharge status: Home / Self Care | Payer: BLUE CROSS/BLUE SHIELD | Source: Ambulatory Visit | Attending: Primary Care | Admitting: Primary Care

## 2015-01-15 ENCOUNTER — Encounter: Payer: Self-pay | Admitting: Primary Care

## 2015-01-15 VITALS — BP 126/74 | HR 82 | Temp 98.0°F | Ht 68.5 in | Wt 188.8 lb

## 2015-01-15 DIAGNOSIS — R05 Cough: Secondary | ICD-10-CM

## 2015-01-15 DIAGNOSIS — R059 Cough, unspecified: Secondary | ICD-10-CM

## 2015-01-15 IMAGING — CR DG CHEST 2V
2 series · 2 of 2 positions shown · non-contrast
Comparison: [DATE].

CLINICAL DATA: Cough for 2 days.

EXAM:
CHEST  2 VIEW

[view not recorded (1 of 2)]
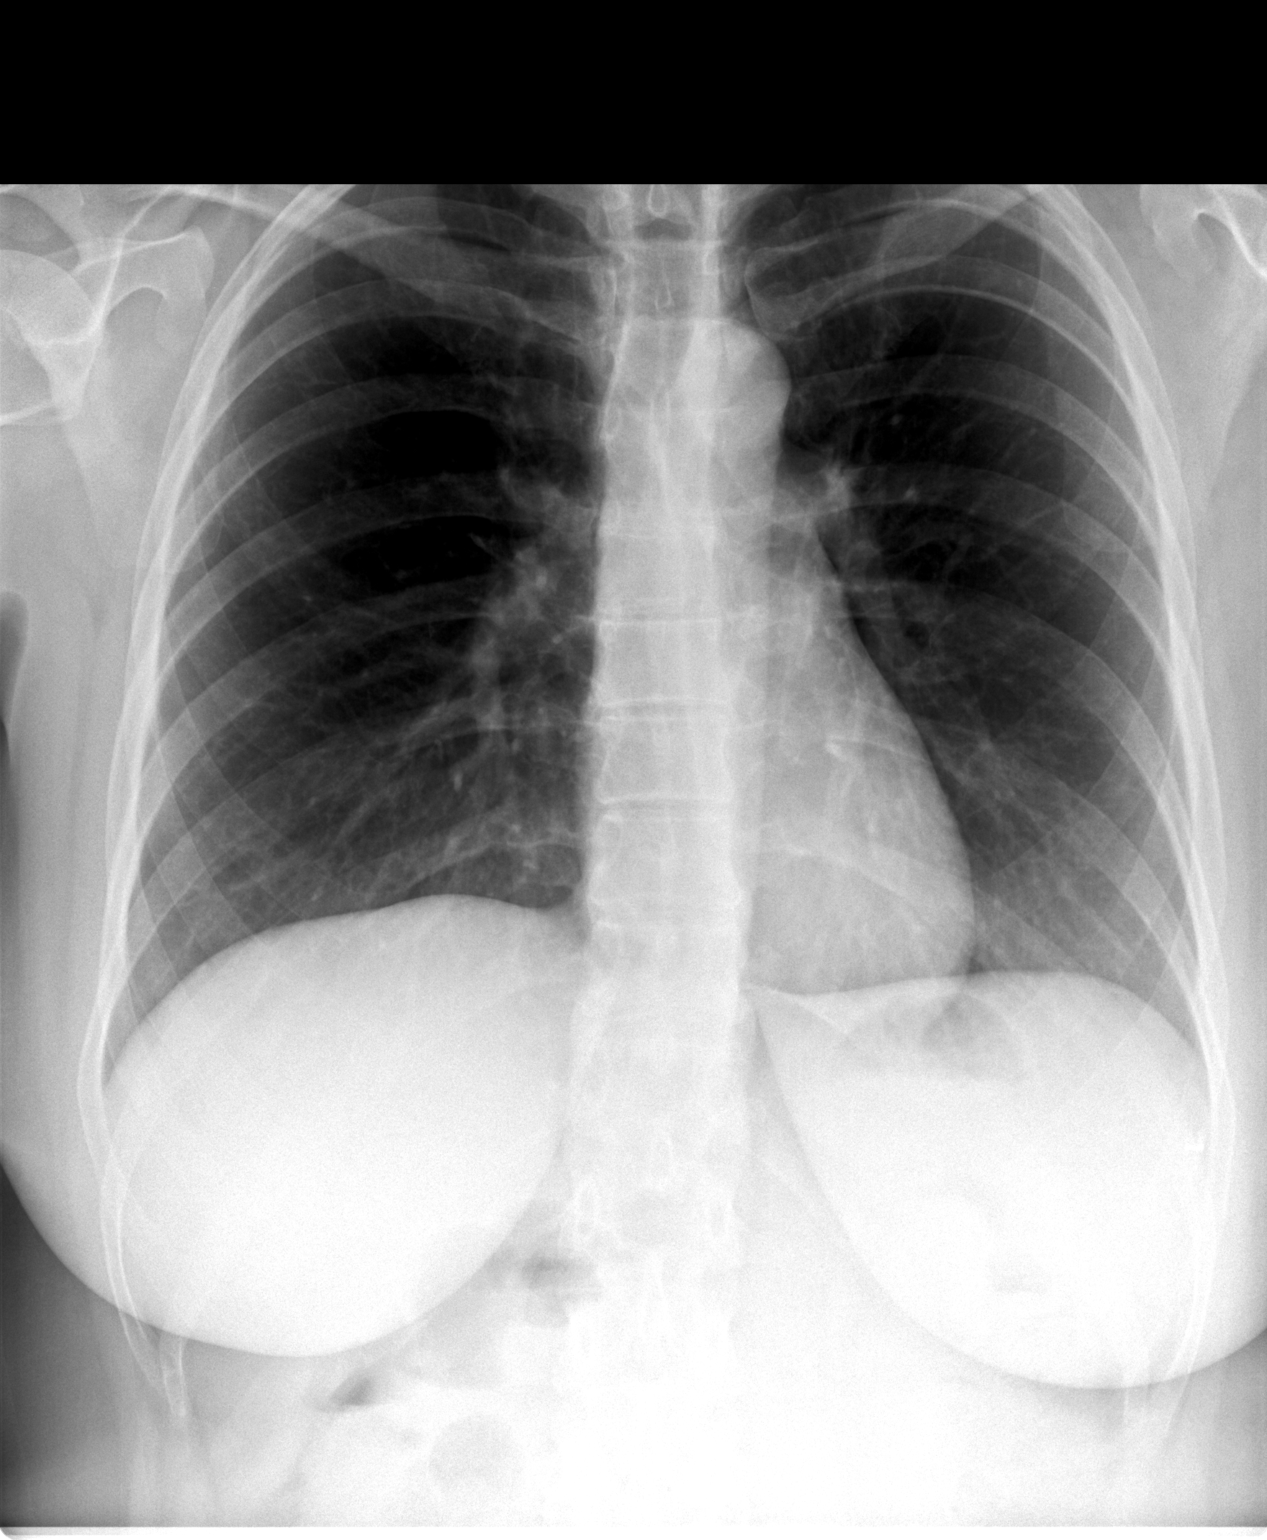

[view not recorded (2 of 2)]
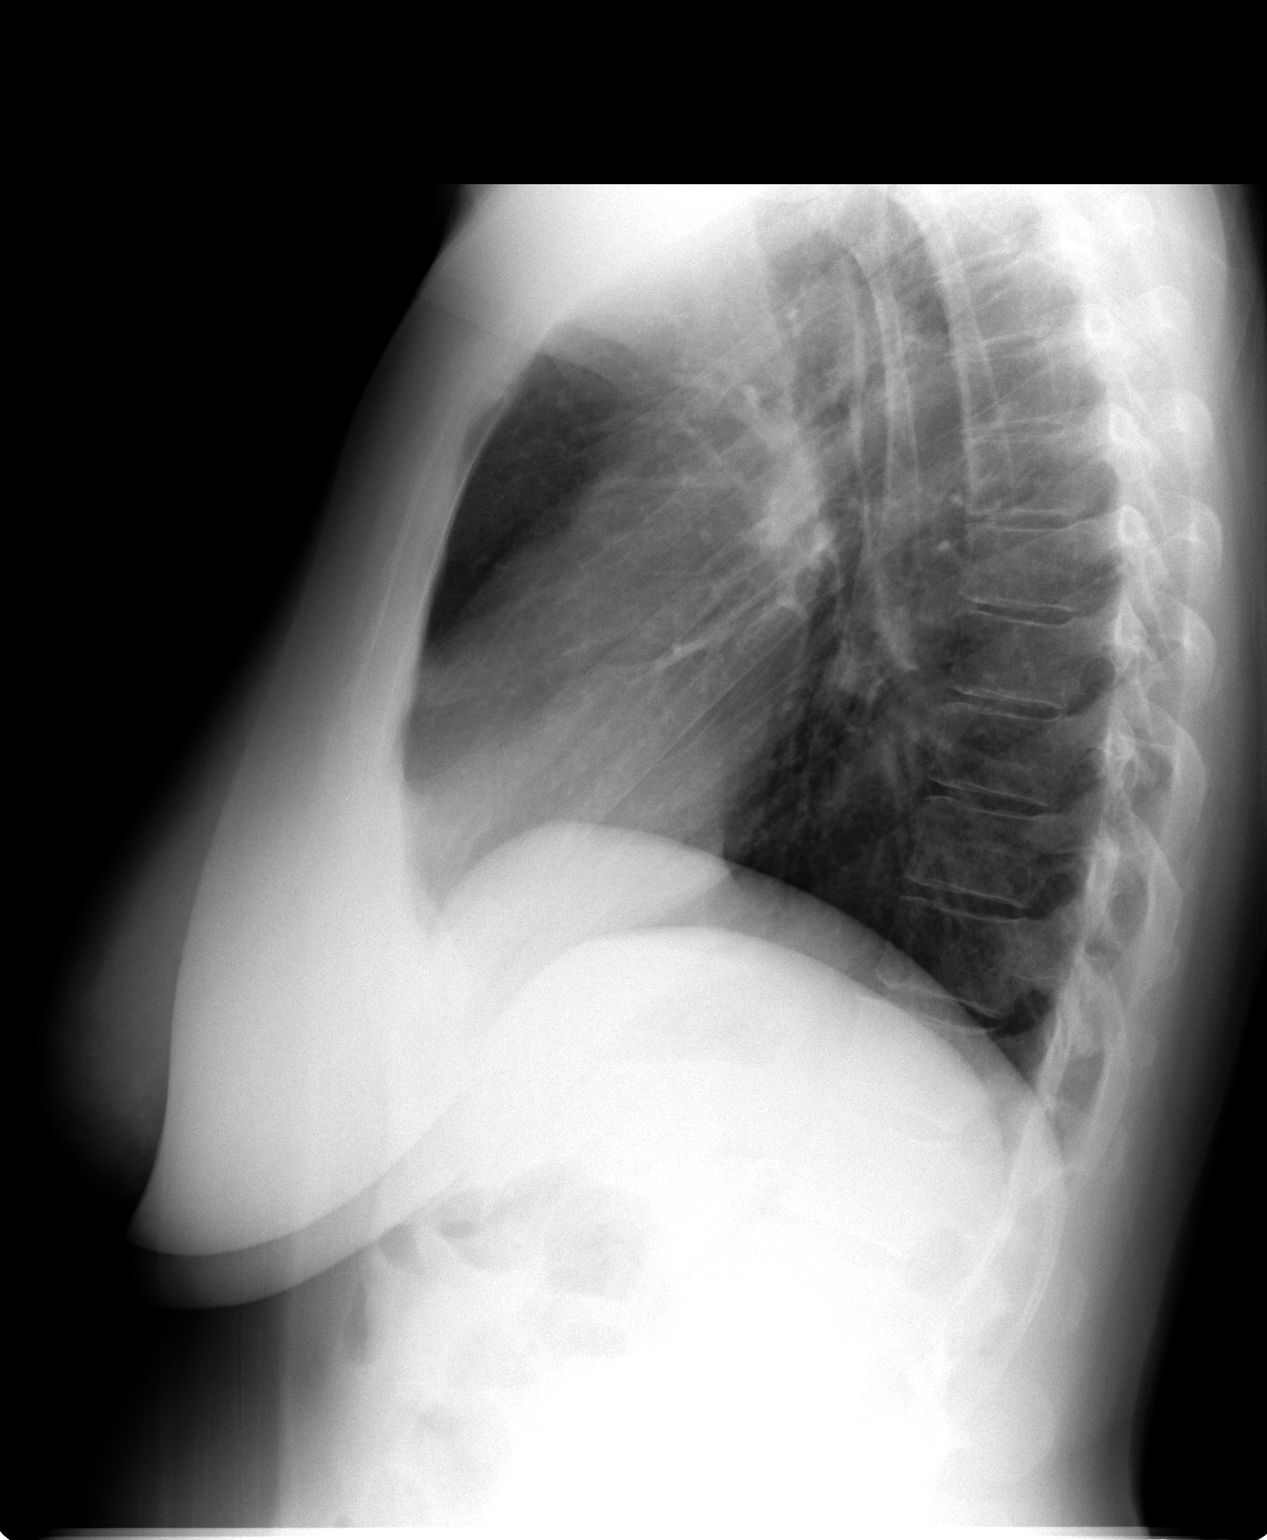

[2 of 2 positions shown; findings below may reference images not displayed]

FINDINGS: Mediastinum and hilar structures normal. Lungs are clear. Heart size
normal. No pleural effusion or pneumothorax. No acute bony
abnormality P
IMPRESSION: No acute cardiopulmonary disease.

## 2015-01-15 MED ORDER — AZITHROMYCIN 250 MG PO TABS: ORAL_TABLET | ORAL | Status: DC

## 2015-01-15 MED ORDER — HYDROCODONE-HOMATROPINE 5-1.5 MG/5ML PO SYRP: 5.0000 mL | ORAL_SOLUTION | Freq: Every evening | ORAL | Status: DC | PRN

## 2015-01-15 NOTE — Patient Instructions (Signed)
You may take the Hycodan cough syrup at bedtime for cough. You may take Robitussin or Delsym for daytime cough.  Hold onto Z-Pak and start Thursday this week if no improvement in symptoms.  Complete xray(s) prior to leaving today. I will contact you regarding your results.  Increase intake of water. It was nice meeting you!

## 2015-01-15 NOTE — Progress Notes (Signed)
Subjective:    Patient ID: Kelly RocheMelinda M Tran, female    DOB: 01/05/1966, 49 y.o.   MRN: [id number]  HPI  Kelly Tran is a 49 year old female who presents today with a chief complaint of cough. Her cough has been present since Friday with some shortness of breath. She's recently been around a co-worker who was diagnosed with pneumonia. Denies fevers, chills, vomiting. She did feel nauseated last Thursday and Friday. She's tried taking Mucinex this past weekend without relief. Overall she's feeling worse.  Review of Systems  HENT: Positive for congestion, postnasal drip and voice change. Negative for ear pain, sinus pressure and sore throat.   Respiratory: Positive for cough and shortness of breath.   Cardiovascular: Negative for chest pain.  Musculoskeletal: Positive for myalgias.       Past Medical History  Diagnosis Date  . G E R D 02/22/2007  . Hemorrhoids, internal 01/31/2011  . Migraine   . Genital warts   . Allergy     History   Social History  . Marital Status: Married    Spouse Name: N/A  . Number of Children: N/A  . Years of Education: N/A   Occupational History  . Not on file.   Social History Main Topics  . Smoking status: Former Smoker    Quit date: 01/31/1996  . Smokeless tobacco: Never Used  . Alcohol Use: 0.0 oz/week    0 Standard drinks or equivalent per week     Comment: occ  . Drug Use: No  . Sexual Activity: Yes   Other Topics Concern  . Not on file   Social History Narrative    Past Surgical History  Procedure Laterality Date  . Appendectomy    . Miscarriage      x3--required d and c  . Dilation and curettage of uterus      Family History  Problem Relation Age of Onset  . Diabetes Neg Hx     family  . Stroke Mother   . Arthritis Mother   . Hypertension Father   . Arthritis Father   . Hyperlipidemia Father   . Cancer Maternal Aunt     ovarian  . Arthritis Maternal Grandmother   . Arthritis Maternal Grandfather   . Arthritis  Paternal Grandmother   . Arthritis Paternal Grandfather   . Cancer Maternal Aunt     Breast  . Cancer Maternal Aunt     Lung    No Known Allergies  Current Outpatient Prescriptions on File Prior to Visit  Medication Sig Dispense Refill  . amitriptyline (ELAVIL) 25 MG tablet TAKE 1 TABLET BY MOUTH ATBEDTIME. 90 tablet 0  . aspirin 81 MG tablet Take 81 mg by mouth daily.    Marland Kitchen. b complex vitamins tablet Take 1 tablet by mouth daily.    . Calcium Carbonate-Vitamin D (CALCIUM 500 + D PO) Take 1 tablet by mouth daily.    . Cholecalciferol (VITAMIN D) 2000 UNITS CAPS Take by mouth daily.    . fluticasone (FLONASE) 50 MCG/ACT nasal spray Place 2 sprays into both nostrils daily. 16 g 0  . naproxen (NAPROSYN) 500 MG tablet Take 1 tablet (500 mg total) by mouth 2 (two) times daily with a meal. 60 tablet 0  . polyethylene glycol (MIRALAX / GLYCOLAX) packet Take 17 g by mouth daily.    Marland Kitchen. thyroid (ARMOUR) 90 MG tablet Take 1 tablet (90 mg total) by mouth daily. 30 tablet 8   No current facility-administered medications  on file prior to visit.    BP 126/74 mmHg  Pulse 82  Temp(Src) 98 F (36.7 C) (Oral)  Ht 5' 8.5" (1.74 m)  Wt 188 lb 12.8 oz (85.639 kg)  BMI 28.29 kg/m2  SpO2 98%    Objective:   Physical Exam  Constitutional: She appears ill.  HENT:  Right Ear: Tympanic membrane and ear canal normal.  Left Ear: Tympanic membrane and ear canal normal.  Nose: Right sinus exhibits no maxillary sinus tenderness and no frontal sinus tenderness. Left sinus exhibits no maxillary sinus tenderness and no frontal sinus tenderness.  Mouth/Throat: Oropharynx is clear and moist.  Eyes: Conjunctivae are normal. Pupils are equal, round, and reactive to light.  Neck: Neck supple.  Cardiovascular: Normal rate and regular rhythm.   Pulmonary/Chest: Effort normal. She has decreased breath sounds in the right lower field.  Cough present on exam  Lymphadenopathy:    She has no cervical adenopathy.    Skin: Skin is warm and dry.          Assessment & Plan:  Cough:  Present since Thursday last week. Recent exposure to pneumonia. Diminished sounds to right lower fields, otherwise clear. Xray today to rule out pneumonia. RX for Zpak to hold and start Thursday this week if no improvement. RX for hycodan for night cough, Delsym for day cough. Follow up PRN.

## 2015-01-15 NOTE — Progress Notes (Signed)
Pre visit review using our clinic review tool, if applicable. No additional management support is needed unless otherwise documented below in the visit note. 

## 2015-01-19 ENCOUNTER — Telehealth: Payer: Self-pay | Admitting: Internal Medicine

## 2015-01-19 NOTE — Telephone Encounter (Signed)
This type of illness is not an excuse to skip jury duty.

## 2015-01-19 NOTE — Telephone Encounter (Signed)
Pt called stating she is still sick .  She has jury duty on Monday and wanted to see if she could get a note to get out of jury duty  Please advise

## 2015-01-19 NOTE — Telephone Encounter (Signed)
Pt is aware per Arielle

## 2015-01-25 ENCOUNTER — Encounter: Payer: Self-pay | Admitting: Internal Medicine

## 2015-01-25 ENCOUNTER — Ambulatory Visit (INDEPENDENT_AMBULATORY_CARE_PROVIDER_SITE_OTHER): Payer: BLUE CROSS/BLUE SHIELD | Admitting: Internal Medicine

## 2015-01-25 VITALS — BP 108/76 | HR 74 | Temp 97.7°F | Wt 190.0 lb

## 2015-01-25 DIAGNOSIS — R059 Cough, unspecified: Secondary | ICD-10-CM

## 2015-01-25 DIAGNOSIS — R05 Cough: Secondary | ICD-10-CM | POA: Diagnosis not present

## 2015-01-25 DIAGNOSIS — J208 Acute bronchitis due to other specified organisms: Secondary | ICD-10-CM | POA: Diagnosis not present

## 2015-01-25 MED ORDER — BENZONATATE 200 MG PO CAPS: 200.0000 mg | ORAL_CAPSULE | Freq: Two times a day (BID) | ORAL | Status: DC | PRN

## 2015-01-25 MED ORDER — HYDROCODONE-HOMATROPINE 5-1.5 MG/5ML PO SYRP: 5.0000 mL | ORAL_SOLUTION | Freq: Every evening | ORAL | Status: DC | PRN

## 2015-01-25 NOTE — Progress Notes (Signed)
Pre visit review using our clinic review tool, if applicable. No additional management support is needed unless otherwise documented below in the visit note. 

## 2015-01-25 NOTE — Patient Instructions (Addendum)
I am calling in Tessalon pearls to take during the day I have refilled your cough syrup at night Stop Mucinex, restart Flonase Watch for fever, colored mucous Cough, Adult  A cough is a reflex. It helps you clear your throat and airways. A cough can help heal your body. A cough can last 2 or 3 weeks (acute) or may last more than 8 weeks (chronic). Some common causes of a cough can include an infection, allergy, or a cold. HOME CARE  Only take medicine as told by your doctor.  If given, take your medicines (antibiotics) as told. Finish them even if you start to feel better.  Use a cold steam vaporizer or humidifier in your home. This can help loosen thick spit (secretions).  Sleep so you are almost sitting up (semi-upright). Use pillows to do this. This helps reduce coughing.  Rest as needed.  Stop smoking if you smoke. GET HELP RIGHT AWAY IF:  You have yellowish-white fluid (pus) in your thick spit.  Your cough gets worse.  Your medicine does not reduce coughing, and you are losing sleep.  You cough up blood.  You have trouble breathing.  Your pain gets worse and medicine does not help.  You have a fever. MAKE SURE YOU:   Understand these instructions.  Will watch your condition.  Will get help right away if you are not doing well or get worse. Document Released: 03/06/2011 Document Revised: 11/07/2013 Document Reviewed: 03/06/2011 [facility] Patient Information 2015 [place], [place]. This information is not intended to replace advice given to you by your health care provider. Make sure you discuss any questions you have with your health care provider.

## 2015-01-25 NOTE — Progress Notes (Signed)
HPI  Pt presents to the clinic today with c/o his has been going on for almost 2 weeks. The cough is productive of thick clear mucous. She denies runny nose, nasal congestion, ear fullness or sore throat. She has have some associated post nasal drip and shortness of breath with exertion. She has been running a low grade fever and chills at times. She was seen by Mayra Reel, NP 7/11 for the same. Chest xray at that time was normal. She was given a Zpack and Hydcoan to take in case her symptoms got worse. She only started taking the Zpack 2 days ago. She has gotten some relief with the Hycodan. She has also taken Sudafed and Mucinex which she does not think has helped that much. She does have a history of seasonal allergies but she does not take an oral antihistamine or intranasal steroid. She has had contacts with people who have been diagnosed with pneumonia. She does not smoke.  Review of Systems      Past Medical History  Diagnosis Date  . G E R D 02/22/2007  . Hemorrhoids, internal 01/31/2011  . Migraine   . Genital warts   . Allergy     Family History  Problem Relation Age of Onset  . Diabetes Neg Hx     family  . Stroke Mother   . Arthritis Mother   . Hypertension Father   . Arthritis Father   . Hyperlipidemia Father   . Cancer Maternal Aunt     ovarian  . Arthritis Maternal Grandmother   . Arthritis Maternal Grandfather   . Arthritis Paternal Grandmother   . Arthritis Paternal Grandfather   . Cancer Maternal Aunt     Breast  . Cancer Maternal Aunt     Lung    History   Social History  . Marital Status: Married    Spouse Name: N/A  . Number of Children: N/A  . Years of Education: N/A   Occupational History  . Not on file.   Social History Main Topics  . Smoking status: Former Smoker    Quit date: 01/31/1996  . Smokeless tobacco: Never Used  . Alcohol Use: 0.0 oz/week    0 Standard drinks or equivalent per week     Comment: occ  . Drug Use: No  . Sexual  Activity: Yes   Other Topics Concern  . Not on file   Social History Narrative    No Known Allergies   Constitutional:  Denies headache, fatigue, fever or abrupt weight changes.  HEENT:  Denies eye redness, eye pain, pressure behind the eyes, facial pain, nasal congestion, ear pain, ringing in the ears, wax buildup, runny nose or sore throat. Respiratory: Positive cough and shortness of breath. Denies difficulty breathing.  Cardiovascular: Denies chest pain, chest tightness, palpitations or swelling in the hands or feet.   No other specific complaints in a complete review of systems (except as listed in HPI above).  Objective:   BP 108/76 mmHg  Pulse 74  Temp(Src) 97.7 F (36.5 C) (Oral)  Wt 190 lb (86.183 kg)  SpO2 98% Wt Readings from Last 3 Encounters:  01/25/15 190 lb (86.183 kg)  01/15/15 188 lb 12.8 oz (85.639 kg)  11/07/14 189 lb (85.73 kg)     General: Appears her stated age, well developed, well nourished in NAD. HEENT: Head: normal shape and size, no sinus tenderness noted; Eyes: sclera white, no icterus, conjunctiva pink, PERRLA and EOMs intact; Ears: Tm's pink but intact,  normal light reflex; Nose: mucosa pink and moist, septum midline; Throat/Mouth: + PND. Teeth present, mucosa erythematous and moist, no exudate noted, no lesions or ulcerations noted.  Neck: No lymphadenopathy. Cardiovascular: Normal rate and rhythm. S1,S2 noted.  No murmur, rubs or gallops noted.  Pulmonary/Chest: Normal effort and positive vesicular breath sounds. No respiratory distress. No wheezes, rales or ronchi noted.      Assessment & Plan:   Viral Bronchitis:  Get some rest and drink plenty of water She is already taking Azithromax, advised her to finish that out Refilled her Hycodan cough syrup for night eRx for Tessalon Pearls for daytime cough Ibuprofen as needed for fever and chills Return precautions given  RTC as needed or if symptoms persist.

## 2015-06-12 ENCOUNTER — Other Ambulatory Visit: Payer: Self-pay | Admitting: Endocrinology

## 2015-06-19 ENCOUNTER — Telehealth: Payer: Self-pay | Admitting: Endocrinology

## 2015-06-19 ENCOUNTER — Other Ambulatory Visit: Payer: Self-pay | Admitting: *Deleted

## 2015-06-19 MED ORDER — THYROID 90 MG PO TABS: ORAL_TABLET | ORAL | Status: DC

## 2015-06-19 NOTE — Telephone Encounter (Signed)
2 tablets were sent in to get her through her appointment on Thursday.

## 2015-06-19 NOTE — Telephone Encounter (Signed)
Pt has made an appt for Thursday am however she will be out of her meds that morning and needs to take it before she eats Thursday can we call in something for her

## 2015-06-20 ENCOUNTER — Other Ambulatory Visit: Payer: Self-pay | Admitting: Obstetrics and Gynecology

## 2015-06-20 LAB — HM PAP SMEAR: HM PAP: NORMAL

## 2015-06-21 ENCOUNTER — Encounter: Payer: Self-pay | Admitting: Endocrinology

## 2015-06-21 ENCOUNTER — Ambulatory Visit (INDEPENDENT_AMBULATORY_CARE_PROVIDER_SITE_OTHER): Payer: BLUE CROSS/BLUE SHIELD | Admitting: Endocrinology

## 2015-06-21 VITALS — BP 122/82 | HR 75 | Temp 98.6°F | Resp 14 | Ht 68.5 in | Wt 194.0 lb

## 2015-06-21 DIAGNOSIS — E038 Other specified hypothyroidism: Secondary | ICD-10-CM | POA: Diagnosis not present

## 2015-06-21 DIAGNOSIS — E063 Autoimmune thyroiditis: Secondary | ICD-10-CM

## 2015-06-21 LAB — CYTOLOGY - PAP

## 2015-06-21 LAB — T3, FREE: T3, Free: 3.7 pg/mL (ref 2.3–4.2)

## 2015-06-21 LAB — T4, FREE: FREE T4: 0.62 ng/dL (ref 0.60–1.60)

## 2015-06-21 LAB — TSH: TSH: 5.87 u[IU]/mL — ABNORMAL HIGH (ref 0.35–4.50)

## 2015-06-21 NOTE — Progress Notes (Signed)
Patient ID: Kelly RocheMelinda M Tran, female   DOB: 04/09/1966, 49 y.o.   MRN: [id number]   Reason for Appointment:  Hypothyroidism, followup visit   History of Present Illness:   The hypothyroidism was first diagnosed in 11/2011 Initially her hypothyroidism was diagnosed with symptoms of hair loss for several years and also fatigue and cold intolerance Notably her TSH at baseline was only 4.7 On her initial consultation she had been still having the symptoms above with taking 50 mcg of levothyroxine  With changing to Armour Thyroid 45 mg daily in 07/2012 her symptoms of fatigue and cold intolerance were improved  Her last visit was in 01/2014 but she had a TSH done with her PCP in March which was normal  Currently she is taking 45 mg daily with a half tablet of 90 mg and takes before tablet on Saturdays. She does think she gets tired regularly but this is not new or worse. Overall she still feels better compared to when she was taking levothyroxine. She still has some concerns about hair loss which is chronic and not any worse  She does feel cold sensitive for the last couple of years and this is about the same  Compliance with the medical regimen has been as prescribed with taking the tablet in the morning before breakfast.   Wt Readings from Last 3 Encounters:  06/21/15 194 lb (87.998 kg)  01/25/15 190 lb (86.183 kg)  01/15/15 188 lb 12.8 oz (85.639 kg)    Lab Results  Component Value Date   TSH 2.03 09/25/2014   TSH 0.62 01/26/2014   TSH 3.32 09/27/2013         Medication List       This list is accurate as of: 06/21/15  9:00 AM.  Always use your most recent med list.               amitriptyline 25 MG tablet  Commonly known as:  ELAVIL  TAKE 1 TABLET BY MOUTH ATBEDTIME.     aspirin 81 MG tablet  Take 81 mg by mouth daily.     b complex vitamins tablet  Take 1 tablet by mouth daily.     CALCIUM 500 + D PO  Take 1 tablet by mouth daily.     fluticasone 50 MCG/ACT nasal spray  Commonly known as:  FLONASE  Place 2 sprays into both nostrils daily.     HYDROcodone-homatropine 5-1.5 MG/5ML syrup  Commonly known as:  HYCODAN  Take 5 mLs by mouth at bedtime as needed.     naproxen 500 MG tablet  Commonly known as:  NAPROSYN  Take 1 tablet (500 mg total) by mouth 2 (two) times daily with a meal.     polyethylene glycol packet  Commonly known as:  MIRALAX / GLYCOLAX  Take 17 g by mouth daily.     thyroid 90 MG tablet  Commonly known as:  ARMOUR  Take 1 tablet daily by mouth on an empty stomach. No further refills until patient is seen in the office.     Vitamin D 2000 UNITS Caps  Take by mouth daily.        Past Medical History  Diagnosis Date  . G E R D 02/22/2007  . Hemorrhoids, internal 01/31/2011  . Migraine   . Genital warts   . Allergy     Past Surgical History  Procedure Laterality Date  . Appendectomy    . Miscarriage      x3--required  d and c  . Dilation and curettage of uterus      Family History  Problem Relation Age of Onset  . Diabetes Neg Hx     family  . Stroke Mother   . Arthritis Mother   . Hypertension Father   . Arthritis Father   . Hyperlipidemia Father   . Cancer Maternal Aunt     ovarian  . Arthritis Maternal Grandmother   . Arthritis Maternal Grandfather   . Arthritis Paternal Grandmother   . Arthritis Paternal Grandfather   . Cancer Maternal Aunt     Breast  . Cancer Maternal Aunt     Lung    Social History:  reports that she quit smoking about 19 years ago. She has never used smokeless tobacco. She reports that she drinks alcohol. She reports that she does not use illicit drugs.  Allergies: No Known Allergies  ROS     Examination:   BP 122/82 mmHg  Pulse 75  Temp(Src) 98.6 F (37 C) (Oral)  Resp 14  Ht 5' 8.5" (1.74 m)  Wt 194 lb (87.998 kg)  BMI 29.07 kg/m2  SpO2 98%   she looks well.   Skin appears normal, no swelling of the hands or eyes      Thyroid  not palpable         NEUROLOGIC EXAM:  biceps reflexes normal, 2+ bilaterally.    Assessment/ Plan:  Hypothyroidism, mild and currently taking Armour Thyroid 45 mg, daily with extra 45 mg once a week She has some chronic symptoms of fatigue and hair loss but also is having some insomnia   Subjectively she had improved taking Armour Thyroid compared to levothyroxine and would like to continue this  Her TSH will need to be rechecked today She will followup in one year unless she has worsening of her fatigue  Kelly Tran 06/21/2015, 9:00 AM

## 2015-06-21 NOTE — Progress Notes (Signed)
Quick Note:  Please let patient know that the thyroid level is slightly low, need to change Armour Thyroid to 60 mg daily, need follow-up in 2 months with labs ______

## 2015-06-22 ENCOUNTER — Telehealth: Payer: Self-pay | Admitting: Endocrinology

## 2015-06-22 ENCOUNTER — Other Ambulatory Visit: Payer: Self-pay | Admitting: *Deleted

## 2015-06-22 LAB — HM MAMMOGRAPHY

## 2015-06-22 MED ORDER — THYROID 60 MG PO TABS: ORAL_TABLET | ORAL | Status: DC

## 2015-06-22 NOTE — Telephone Encounter (Signed)
Patient called stating that she would like a refill on her Rx  Rx: Armour   Pharmacy: [facility]  Thank you

## 2015-06-25 ENCOUNTER — Encounter: Payer: Self-pay | Admitting: Internal Medicine

## 2015-06-28 ENCOUNTER — Ambulatory Visit (INDEPENDENT_AMBULATORY_CARE_PROVIDER_SITE_OTHER): Payer: BLUE CROSS/BLUE SHIELD | Admitting: Internal Medicine

## 2015-06-28 ENCOUNTER — Encounter: Payer: Self-pay | Admitting: Internal Medicine

## 2015-06-28 VITALS — BP 124/82 | HR 87 | Temp 98.1°F | Wt 189.0 lb

## 2015-06-28 DIAGNOSIS — E559 Vitamin D deficiency, unspecified: Secondary | ICD-10-CM | POA: Diagnosis not present

## 2015-06-28 DIAGNOSIS — G4485 Primary stabbing headache: Secondary | ICD-10-CM

## 2015-06-28 DIAGNOSIS — R5383 Other fatigue: Secondary | ICD-10-CM | POA: Diagnosis not present

## 2015-06-28 DIAGNOSIS — J302 Other seasonal allergic rhinitis: Secondary | ICD-10-CM

## 2015-06-28 LAB — VITAMIN D 25 HYDROXY (VIT D DEFICIENCY, FRACTURES): VITD: 22.84 ng/mL — ABNORMAL LOW (ref 30.00–100.00)

## 2015-06-28 LAB — FOLATE: Folate: >23.8 ng/mL (ref >5.9)

## 2015-06-28 LAB — VITAMIN B12: VITAMIN B 12: 452 pg/mL (ref 211–911)

## 2015-06-28 MED ORDER — FLUTICASONE PROPIONATE 50 MCG/ACT NA SUSP: 2.0000 | Freq: Every day | NASAL | Status: DC

## 2015-06-28 NOTE — Progress Notes (Signed)
Subjective:    Patient ID: Kelly Tran, female    DOB: 04/18/1966, 49 y.o.   MRN: [id number]  HPI  Pt presents to the clinic today with c/o fatigue and headache. The headache is located in the back of her head. She describes the pain as sharp and shooting. The pain on lasts a few seconds. She denies changes in vision or dizziness. These headaches come intermittently. She is not able to figure out a pattern. She does have pain in her neck but this is a chronic issue for her. She does follow with a chiropractor monthly. Her last cervical spine xray was 1-2 years ago. The fatigue has been going on for years. She thought it would improve after being started on thyroid medication but it has not. Her Armour Thyroid was increased 1 week ago by her endocrinologist. She is sleeping 7-8 hours per night. She did have a sleep study 4-5 years ago, and she did not have sleep apnea. She is not under any more stress than usual. She denies anxiety or depression.  She would like a refill of her Flonase today as well.  Review of Systems      Past Medical History  Diagnosis Date  . G E R D 02/22/2007  . Hemorrhoids, internal 01/31/2011  . Migraine   . Genital warts   . Allergy     Current Outpatient Prescriptions  Medication Sig Dispense Refill  . amitriptyline (ELAVIL) 25 MG tablet TAKE 1 TABLET BY MOUTH ATBEDTIME. 90 tablet 0  . aspirin 81 MG tablet Take 81 mg by mouth daily.    Marland Kitchen b complex vitamins tablet Take 1 tablet by mouth daily.    . Calcium Carbonate-Vitamin D (CALCIUM 500 + D PO) Take 1 tablet by mouth daily.    . Cholecalciferol (VITAMIN D) 2000 UNITS CAPS Take by mouth daily.    . fluticasone (FLONASE) 50 MCG/ACT nasal spray Place 2 sprays into both nostrils daily. 16 g 0  . naproxen (NAPROSYN) 500 MG tablet Take 1 tablet (500 mg total) by mouth 2 (two) times daily with a meal. 60 tablet 0  . polyethylene glycol (MIRALAX / GLYCOLAX) packet Take 17 g by mouth daily.    Marland Kitchen thyroid (ARMOUR  THYROID) 60 MG tablet Take 1 tablet daily on an empty stomach, 90 tablet 0   No current facility-administered medications for this visit.    No Known Allergies  Family History  Problem Relation Age of Onset  . Diabetes Neg Hx     family  . Stroke Mother   . Arthritis Mother   . Hypertension Father   . Arthritis Father   . Hyperlipidemia Father   . Cancer Maternal Aunt     ovarian  . Arthritis Maternal Grandmother   . Arthritis Maternal Grandfather   . Arthritis Paternal Grandmother   . Arthritis Paternal Grandfather   . Cancer Maternal Aunt     Breast  . Cancer Maternal Aunt     Lung    Social History   Social History  . Marital Status: Married    Spouse Name: N/A  . Number of Children: N/A  . Years of Education: N/A   Occupational History  . Not on file.   Social History Main Topics  . Smoking status: Former Smoker    Quit date: 01/31/1996  . Smokeless tobacco: Never Used  . Alcohol Use: 0.0 oz/week    0 Standard drinks or equivalent per week     Comment:  occ  . Drug Use: No  . Sexual Activity: Yes   Other Topics Concern  . Not on file   Social History Narrative     Constitutional: Pt reports fatigue and headache. Denies fever, malaise or abrupt weight changes.  HEENT: Denies eye pain, eye redness, ear pain, ringing in the ears, wax buildup, runny nose, nasal congestion, bloody nose, or sore throat. Respiratory: Denies difficulty breathing, shortness of breath, cough or sputum production.   Cardiovascular: Denies chest pain, chest tightness, palpitations or swelling in the hands or feet.  Gastrointestinal: Denies abdominal pain, bloating, constipation, diarrhea or blood in the stool.  Neurological: Denies dizziness, difficulty with memory, difficulty with speech or problems with balance and coordination.  Psych: Denies anxiety, depression, SI/HI.  No other specific complaints in a complete review of systems (except as listed in HPI  above).  Objective:   Physical Exam  BP 124/82 mmHg  Pulse 87  Temp(Src) 98.1 F (36.7 C) (Oral)  Wt 189 lb (85.73 kg)  SpO2 98% Wt Readings from Last 3 Encounters:  06/28/15 189 lb (85.73 kg)  06/21/15 194 lb (87.998 kg)  01/25/15 190 lb (86.183 kg)    General: Appearsher stated age, well developed, well nourished in NAD. HEENT: Head: normal shape and size; Eyes: sclera white, no icterus, conjunctiva pink, PERRLA and EOMs intact;  Cardiovascular: Normal rate and rhythm.  Pulmonary/Chest: Normal effort and positive vesicular breath sounds. No respiratory distress. No wheezes, rales or ronchi noted.  Musculoskeletal: Normal flexion, extension and rotation of the cervical spine. No bony tenderness noted. Strength 5/5 BUE. Neurological: Alert and oriented.  Coordination normal.  Psychiatric: Mood and affect normal.   BMET    Component Value Date/Time   NA 137 09/25/2014 1001   K 4.0 09/25/2014 1001   CL 102 09/25/2014 1001   CO2 26 09/25/2014 1001   GLUCOSE 99 09/25/2014 1001   BUN 13 09/25/2014 1001   CREATININE 0.71 09/25/2014 1001   CALCIUM 9.8 09/25/2014 1001   GFRNONAA 114.69 04/04/2009 0752   GFRAA 117 02/16/2007 1040    Lipid Panel     Component Value Date/Time   CHOL 168 09/25/2014 1001   TRIG 52.0 09/25/2014 1001   HDL 94.60 09/25/2014 1001   CHOLHDL 2 09/25/2014 1001   VLDL 10.4 09/25/2014 1001   LDLCALC 63 09/25/2014 1001    CBC    Component Value Date/Time   WBC 6.2 09/25/2014 1001   RBC 4.47 09/25/2014 1001   HGB 13.4 09/25/2014 1001   HCT 39.9 09/25/2014 1001   PLT 303.0 09/25/2014 1001   MCV 89.3 09/25/2014 1001   MCHC 33.7 09/25/2014 1001   RDW 12.2 09/25/2014 1001   LYMPHSABS 3.2 05/03/2013 1127   MONOABS 0.7 05/03/2013 1127   EOSABS 0.2 05/03/2013 1127   BASOSABS 0.0 05/03/2013 1127    Hgb A1C No results found for: HGBA1C       Assessment & Plan:   Headache:  ? Nerve related from cervical spine. ? Non ruptured  anuerysm Discussed CT cervical spine versus CT angio brain- she would like to hold off on both of these for now  Fatigue:  Check B12, Folate and Vit D ? Chronic fatigue- if labs normal, advised her to start a routine exercise program  Allergic Rhinitis:  Flonase refilled today  Will follow up after labs

## 2015-06-28 NOTE — Patient Instructions (Signed)
Chronic Fatigue Syndrome Chronic fatigue syndrome (CFS) is a condition in which there is lasting, extreme tiredness (fatigue) that does not improve with rest. CFS affects women up to four times more often than men. If you have CFS, fatigue and other symptoms can make it hard for you to get through your day. There is no treatment or cure. You will need to work closely with your health care provider to come up with a treatment plan that works for you. CAUSES  No one knows what causes CFS. It may be triggered by a flu-like illness or by mono. Other triggers may include:  An abnormal immune system.  Low blood pressure.  Poor diet.  Physical or emotional stress. SIGNS AND SYMPTOMS The main symptom is fatigue that lasts all day, especially after physical or mental stress. Other common symptoms include:  An extreme loss of energy with no obvious cause.  Muscle or joint soreness.  Severe weakness.  Frequent headaches.  Fever.  Sore throat.  Swollen lymph glands.  Sleep is not refreshing.  Loss of concentration or memory. Less common symptoms may include:  Chills.  Night sweats.  Tingling or numbness.  Blurred vision.  Dizziness.  Sensitivity to noise or odors.  Mood swings.  Anxiety, panic attacks, and depression. Your symptoms may come and go, or you may have them all the time. DIAGNOSIS  There are no tests that can help health care providers diagnose CFS. It may take a long time for you to get a correct diagnosis. Your health care provider may need to do a number of tests to rule out other conditions that could be causing your symptoms. You may be diagnosed with CFS if:  You have fatigue that has lasted for at least six months.  Your fatigue is not relieved by rest.  Your fatigue is not caused by another condition.  Your fatigue is severe enough to interfere with work and daily activities.  You have at least four common symptoms of CFS. TREATMENT  There is no  cure for CFS at this time. The condition affects everyone differently. You will need to work with your health care provider to find the best treatment for your symptoms. Treatment may include:  Improving sleep with a regular bedtime routine.  Avoiding caffeine, alcohol, and tobacco.  Doing light exercise and stretching during the day.  Taking medicine to help you sleep.  Taking over-the-counter medicines to relieve joint or muscle pain.  Learning and practicing relaxation techniques.  Using memory aids or doing brain teasers to improve memory and concentration.  Seeing a mental health professional to evaluate and treat depression, if necessary.  Trying massage therapy, acupuncture, and movement exercises, like yoga or tai chi. HOME CARE INSTRUCTIONS Work closely with your health care provider to follow your treatment plan at home. You may need to make major lifestyle changes. If treatment does not seem to help, get a second opinion. You may get help from many health care providers, including doctors, mental health specialists, physical therapists, and rehabilitation therapists. Having the support of friends and loved ones is also important. SEEK MEDICAL CARE IF:  Your symptoms are not responding to treatment.  You are having strong feelings of anger, guilt, anxiety, or depression.   This information is not intended to replace advice given to you by your health care provider. Make sure you discuss any questions you have with your health care provider.   Document Released: 07/31/2004 Document Revised: 07/14/2014 Document Reviewed: 05/13/2013 Elsevier Interactive Patient   Education 2016 [employer].

## 2015-06-28 NOTE — Progress Notes (Signed)
Pre visit review using our clinic review tool, if applicable. No additional management support is needed unless otherwise documented below in the visit note. 

## 2015-06-29 MED ORDER — VITAMIN D (ERGOCALCIFEROL) 1.25 MG (50000 UNIT) PO CAPS: 50000.0000 [IU] | ORAL_CAPSULE | ORAL | Status: DC

## 2015-06-29 NOTE — Addendum Note (Signed)
Addended by: Roena MaladyEVONTENNO, Icelynn Onken Y on: 06/29/2015 03:26 PM   Modules accepted: Orders

## 2015-06-29 NOTE — Addendum Note (Signed)
Addended by: Roena MaladyEVONTENNO, MELANIE Y on: 06/29/2015 03:25 PM   Modules accepted: Orders

## 2015-07-18 ENCOUNTER — Encounter: Payer: Self-pay | Admitting: Internal Medicine

## 2015-07-18 ENCOUNTER — Ambulatory Visit (INDEPENDENT_AMBULATORY_CARE_PROVIDER_SITE_OTHER): Payer: BLUE CROSS/BLUE SHIELD | Admitting: Internal Medicine

## 2015-07-18 VITALS — BP 120/80 | HR 91 | Temp 98.2°F | Wt 196.0 lb

## 2015-07-18 DIAGNOSIS — R002 Palpitations: Secondary | ICD-10-CM

## 2015-07-18 DIAGNOSIS — R5383 Other fatigue: Secondary | ICD-10-CM

## 2015-07-18 DIAGNOSIS — R51 Headache: Secondary | ICD-10-CM

## 2015-07-18 DIAGNOSIS — R112 Nausea with vomiting, unspecified: Secondary | ICD-10-CM

## 2015-07-18 DIAGNOSIS — R519 Headache, unspecified: Secondary | ICD-10-CM

## 2015-07-18 NOTE — Progress Notes (Signed)
Subjective:    Patient ID: Kelly Tran, female    DOB: Jun 22, 1966, 49 y.o.   MRN: [id number]  HPI  Pt presents to the clinic today with c/o a pounding in her head when she lays down, nausea, fatigue and palpitations. She has had a headache behind the left eye. She describes the pain as throbbing but denies visual changes. She denies dizziness. She checked her blood pressure at that time it was 145/89. She denies chest pain or shortness of breath. She is not sure if it is related to her blood pressure. She has no history of HTN. Her BP today is 120/80. She does tell me that her thyroid medication was recently increased a few weeks ago, by Dr. Lucianne Muss and also not sure if this is related. She has been a little more stressed at work but denies anxiety or depression. She feels like her heart is pounding right now as she is sitting here.  Review of Systems      Past Medical History  Diagnosis Date  . G E R D 02/22/2007  . Hemorrhoids, internal 01/31/2011  . Migraine   . Genital warts   . Allergy     Current Outpatient Prescriptions  Medication Sig Dispense Refill  . amitriptyline (ELAVIL) 25 MG tablet TAKE 1 TABLET BY MOUTH ATBEDTIME. 90 tablet 0  . aspirin 81 MG tablet Take 81 mg by mouth daily.    Marland Kitchen b complex vitamins tablet Take 1 tablet by mouth daily.    . Calcium Carbonate-Vitamin D (CALCIUM 500 + D PO) Take 1 tablet by mouth daily.    . Cholecalciferol (VITAMIN D) 2000 UNITS CAPS Take by mouth daily.    . fluticasone (FLONASE) 50 MCG/ACT nasal spray Place 2 sprays into both nostrils daily. 16 g 0  . naproxen (NAPROSYN) 500 MG tablet Take 1 tablet (500 mg total) by mouth 2 (two) times daily with a meal. 60 tablet 0  . polyethylene glycol (MIRALAX / GLYCOLAX) packet Take 17 g by mouth daily.    Marland Kitchen thyroid (ARMOUR THYROID) 60 MG tablet Take 1 tablet daily on an empty stomach, 90 tablet 0  . Vitamin D, Ergocalciferol, (DRISDOL) 50000 UNITS CAPS capsule Take 1 capsule (50,000 Units  total) by mouth every 7 (seven) days. 12 capsule 0   No current facility-administered medications for this visit.    No Known Allergies  Family History  Problem Relation Age of Onset  . Diabetes Neg Hx     family  . Stroke Mother   . Arthritis Mother   . Hypertension Father   . Arthritis Father   . Hyperlipidemia Father   . Cancer Maternal Aunt     ovarian  . Arthritis Maternal Grandmother   . Arthritis Maternal Grandfather   . Arthritis Paternal Grandmother   . Arthritis Paternal Grandfather   . Cancer Maternal Aunt     Breast  . Cancer Maternal Aunt     Lung    Social History   Social History  . Marital Status: Married    Spouse Name: N/A  . Number of Children: N/A  . Years of Education: N/A   Occupational History  . Not on file.   Social History Main Topics  . Smoking status: Former Smoker    Quit date: 01/31/1996  . Smokeless tobacco: Never Used  . Alcohol Use: 0.0 oz/week    0 Standard drinks or equivalent per week     Comment: occ  . Drug Use: No  .  Sexual Activity: Yes   Other Topics Concern  . Not on file   Social History Narrative     Constitutional: Pt reports headache and fatigue. Denies fever, malaise, or abrupt weight changes.  HEENT: Denies eye pain, eye redness, ear pain, ringing in the ears, wax buildup, runny nose, nasal congestion, bloody nose, or sore throat. Respiratory: Denies difficulty breathing, shortness of breath, cough or sputum production.   Cardiovascular: Pt reports palpitations. Denies chest pain, chest tightness, or swelling in the hands or feet.  Gastrointestinal: Pt reports nausea. Denies abdominal pain, bloating, constipation, diarrhea or blood in the stool.  Neurological: Denies dizziness, difficulty with memory, difficulty with speech or problems with balance and coordination.  Psych: Denies anxiety, depression, SI/HI.  No other specific complaints in a complete review of systems (except as listed in HPI  above).  Objective:   Physical Exam   BP 120/80 mmHg  Pulse 91  Temp(Src) 98.2 F (36.8 C) (Oral)  Wt 196 lb (88.905 kg)  SpO2 96% Wt Readings from Last 3 Encounters:  07/18/15 196 lb (88.905 kg)  06/28/15 189 lb (85.73 kg)  06/21/15 194 lb (87.998 kg)    General: Appears her stated age, well developed, well nourished in NAD. HEENT: Head: normal shape and size; Eyes: sclera white, no icterus, conjunctiva pink, PERRLA and EOMs intact; Ears: Tm's gray and intact, normal light reflex; Throat/Mouth: Teeth present, mucosa pink and moist, no exudate, lesions or ulcerations noted.  Neck: No adenopathy noted.  Cardiovascular: Normal rate and rhythm. S1,S2 noted.  No murmur, rubs or gallops noted.  Pulmonary/Chest: Normal effort and positive vesicular breath sounds. No respiratory distress. No wheezes, rales or ronchi noted.  Abdomen: Soft and nontender. Normal bowel sounds. Neurological: Alert and oriented.  Psychiatric: She does not seem anxious today.  BMET    Component Value Date/Time   NA 137 09/25/2014 1001   K 4.0 09/25/2014 1001   CL 102 09/25/2014 1001   CO2 26 09/25/2014 1001   GLUCOSE 99 09/25/2014 1001   BUN 13 09/25/2014 1001   CREATININE 0.71 09/25/2014 1001   CALCIUM 9.8 09/25/2014 1001   GFRNONAA 114.69 04/04/2009 0752   GFRAA 117 02/16/2007 1040    Lipid Panel     Component Value Date/Time   CHOL 168 09/25/2014 1001   TRIG 52.0 09/25/2014 1001   HDL 94.60 09/25/2014 1001   CHOLHDL 2 09/25/2014 1001   VLDL 10.4 09/25/2014 1001   LDLCALC 63 09/25/2014 1001    CBC    Component Value Date/Time   WBC 6.2 09/25/2014 1001   RBC 4.47 09/25/2014 1001   HGB 13.4 09/25/2014 1001   HCT 39.9 09/25/2014 1001   PLT 303.0 09/25/2014 1001   MCV 89.3 09/25/2014 1001   MCHC 33.7 09/25/2014 1001   RDW 12.2 09/25/2014 1001   LYMPHSABS 3.2 05/03/2013 1127   MONOABS 0.7 05/03/2013 1127   EOSABS 0.2 05/03/2013 1127   BASOSABS 0.0 05/03/2013 1127    Hgb A1C No  results found for: HGBA1C      Assessment & Plan:   Headache, fatigue, nausea and palpitations:  Exam benign today ECG: normal She declines labs, CMET, Mg, B12, Folate ? R/T increase Armour Thyroid dose- she is due for repeat labs in 3 weeks She would like a referral to another endocrinologist, advised her to let me know who she would like to see  RTC as needed or if symptoms persist or worsen

## 2015-07-18 NOTE — Patient Instructions (Signed)

## 2015-07-31 ENCOUNTER — Other Ambulatory Visit: Payer: Self-pay | Admitting: *Deleted

## 2015-07-31 ENCOUNTER — Telehealth: Payer: Self-pay | Admitting: Endocrinology

## 2015-07-31 DIAGNOSIS — E038 Other specified hypothyroidism: Secondary | ICD-10-CM

## 2015-07-31 DIAGNOSIS — E039 Hypothyroidism, unspecified: Secondary | ICD-10-CM

## 2015-07-31 NOTE — Telephone Encounter (Signed)
Please see below and advise if okay to add these labs?

## 2015-07-31 NOTE — Telephone Encounter (Signed)
We will only do Free T3 and 4

## 2015-07-31 NOTE — Telephone Encounter (Signed)
Ordered Free T3 and free T4

## 2015-07-31 NOTE — Telephone Encounter (Signed)
Patient is coming in the 30th for her lads and would like the following drawn T3, T4, Reverse T3, Antibodies TBO / TG, please advise (She stated that Dr Lucianne Muss ask her to come in earlier because her thyroid levels were to low.)

## 2015-08-06 ENCOUNTER — Other Ambulatory Visit (INDEPENDENT_AMBULATORY_CARE_PROVIDER_SITE_OTHER): Payer: BLUE CROSS/BLUE SHIELD

## 2015-08-06 DIAGNOSIS — E038 Other specified hypothyroidism: Secondary | ICD-10-CM | POA: Diagnosis not present

## 2015-08-06 DIAGNOSIS — E039 Hypothyroidism, unspecified: Secondary | ICD-10-CM

## 2015-08-06 DIAGNOSIS — E063 Autoimmune thyroiditis: Secondary | ICD-10-CM

## 2015-08-06 LAB — T4, FREE: Free T4: 0.58 ng/dL — ABNORMAL LOW (ref 0.60–1.60)

## 2015-08-06 LAB — T3, FREE: T3, Free: 3 pg/mL (ref 2.3–4.2)

## 2015-08-06 LAB — TSH: TSH: 2.74 u[IU]/mL (ref 0.35–4.50)

## 2015-08-09 ENCOUNTER — Encounter: Payer: Self-pay | Admitting: Endocrinology

## 2015-08-09 ENCOUNTER — Ambulatory Visit (INDEPENDENT_AMBULATORY_CARE_PROVIDER_SITE_OTHER): Payer: BLUE CROSS/BLUE SHIELD | Admitting: Endocrinology

## 2015-08-09 VITALS — BP 122/82 | HR 97 | Temp 98.4°F | Resp 14 | Ht 68.5 in | Wt 190.6 lb

## 2015-08-09 DIAGNOSIS — E038 Other specified hypothyroidism: Secondary | ICD-10-CM | POA: Diagnosis not present

## 2015-08-09 DIAGNOSIS — E063 Autoimmune thyroiditis: Secondary | ICD-10-CM

## 2015-08-09 NOTE — Progress Notes (Signed)
Patient ID: Kelly Tran, female   DOB: Oct 17, 1965, 50 y.o.   MRN: [id number]   Reason for Appointment:  Hypothyroidism, followup visit   History of Present Illness:   The hypothyroidism was first diagnosed in 11/2011 Initially her hypothyroidism was diagnosed with symptoms of hair loss for several years and also fatigue and cold intolerance Notably her TSH at baseline was only 4.7 On her initial consultation she had been still having the symptoms above with taking 50 mcg of levothyroxine  With changing to Armour Thyroid 45 mg daily in 07/2012 her symptoms of fatigue and cold intolerance were improved However on her follow-up visit in 12/16 her TSH was 5.9 She was having symptoms of fatigue as usual along with some chronic hair loss  Since increasing her Armour Thyroid to 60 mg daily in 12/16 she is now complaining about palpitations at times including at night She does not feel shaky or nervous, no heat intolerance She still gets tired and she thinks this may be somewhat worse. She says she wakes up feeling tired and feels like going to sleep in the afternoon and evening when coming back from work  She does feel cold sensitive for the last couple of years and this is about the same  Compliance with the medical regimen has been as prescribed with taking the tablet in the morning before breakfast.   Wt Readings from Last 3 Encounters:  08/09/15 190 lb 9.6 oz (86.456 kg)  07/18/15 196 lb (88.905 kg)  06/28/15 189 lb (85.73 kg)    Lab Results  Component Value Date   TSH 2.74 08/06/2015   TSH 5.87* 06/21/2015   TSH 2.03 09/25/2014         Medication List       This list is accurate as of: 08/09/15 11:59 PM.  Always use your most recent med list.               amitriptyline 25 MG tablet  Commonly known as:  ELAVIL  TAKE 1 TABLET BY MOUTH ATBEDTIME.     aspirin 81 MG tablet  Take 81 mg by mouth daily.     b complex vitamins tablet  Take 1 tablet by  mouth daily.     CALCIUM 500 + D PO  Take 1 tablet by mouth daily.     fluticasone 50 MCG/ACT nasal spray  Commonly known as:  FLONASE  Place 2 sprays into both nostrils daily.     naproxen 500 MG tablet  Commonly known as:  NAPROSYN  Take 1 tablet (500 mg total) by mouth 2 (two) times daily with a meal.     polyethylene glycol packet  Commonly known as:  MIRALAX / GLYCOLAX  Take 17 g by mouth daily.     thyroid 60 MG tablet  Commonly known as:  ARMOUR THYROID  Take 1 tablet daily on an empty stomach,     Vitamin D (Ergocalciferol) 50000 units Caps capsule  Commonly known as:  DRISDOL  Take 1 capsule (50,000 Units total) by mouth every 7 (seven) days.        Past Medical History  Diagnosis Date  . G E R D 02/22/2007  . Hemorrhoids, internal 01/31/2011  . Migraine   . Genital warts   . Allergy     Past Surgical History  Procedure Laterality Date  . Appendectomy    . Miscarriage      x3--required d and c  . Dilation and curettage of  uterus      Family History  Problem Relation Age of Onset  . Diabetes Neg Hx     family  . Stroke Mother   . Arthritis Mother   . Hypertension Father   . Arthritis Father   . Hyperlipidemia Father   . Cancer Maternal Aunt     ovarian  . Arthritis Maternal Grandmother   . Arthritis Maternal Grandfather   . Arthritis Paternal Grandmother   . Arthritis Paternal Grandfather   . Cancer Maternal Aunt     Breast  . Cancer Maternal Aunt     Lung    Social History:  reports that she quit smoking about 19 years ago. She has never used smokeless tobacco. She reports that she drinks alcohol. She reports that she does not use illicit drugs.  Allergies: No Known Allergies  ROS   She has a history of snoring She thinks her sleep study was normal about 5 years ago   Examination:   BP 122/82 mmHg  Pulse 97  Temp(Src) 98.4 F (36.9 C)  Resp 14  Ht 5' 8.5" (1.74 m)  Wt 190 lb 9.6 oz (86.456 kg)  BMI 28.56 kg/m2  SpO2 98%    she looks well.   Heart rate regular, 96, heart tones normal  Thyroid not palpable         NEUROLOGIC EXAM:  biceps reflexes normal, 2+ bilaterally.    Assessment/ Plan:  Hypothyroidism, mild and currently taking Armour Thyroid 60 mg daily Her dose was increased when her TSH was 5.9 Surprisingly she is not feeling any better and complains of sleepiness and fatigue consistently now Also is complaining about palpitations and appears to have mild tachycardia  Discussed with the patient that since her T3 level is not hired is unlikely that the Armour Thyroid is causing her tachycardia and fatigue  She was given the option of going back to levothyroxine but she does not want to do this.  For now she agrees to stop her amitriptyline and if need be discuss other alternatives with PCP Encouraged her to consider a sleep study again  If she still continues to have some palpitations with Armour Thyroid may switch her to Cytomel 5 g with 50 g of levothyroxine Follow-up in 3 months  Jenan Ellegood 08/10/2015, 9:46 AM

## 2015-08-09 NOTE — Patient Instructions (Signed)
Hold Elavil

## 2015-09-06 ENCOUNTER — Telehealth: Payer: Self-pay | Admitting: Endocrinology

## 2015-09-06 ENCOUNTER — Other Ambulatory Visit: Payer: Self-pay | Admitting: *Deleted

## 2015-09-06 MED ORDER — THYROID 60 MG PO TABS: ORAL_TABLET | ORAL | Status: DC

## 2015-09-06 NOTE — Telephone Encounter (Signed)
Pt called in and said she saw where she picked up the last refill of her thyroid medication and wanted to know if he would refill her prescription or if she needed to be seen before that (pt has appointment already in May) and if he goes ahead and refills it, she would like it sent to [facility] in [place], [place]

## 2015-09-06 NOTE — Telephone Encounter (Signed)
rx sent

## 2015-09-10 ENCOUNTER — Encounter: Payer: Self-pay | Admitting: Internal Medicine

## 2015-09-10 ENCOUNTER — Ambulatory Visit (INDEPENDENT_AMBULATORY_CARE_PROVIDER_SITE_OTHER): Payer: BLUE CROSS/BLUE SHIELD | Admitting: Internal Medicine

## 2015-09-10 VITALS — BP 118/74 | HR 70 | Temp 98.1°F | Wt 189.0 lb

## 2015-09-10 DIAGNOSIS — R197 Diarrhea, unspecified: Secondary | ICD-10-CM

## 2015-09-10 DIAGNOSIS — R1032 Left lower quadrant pain: Secondary | ICD-10-CM

## 2015-09-10 DIAGNOSIS — R110 Nausea: Secondary | ICD-10-CM

## 2015-09-10 LAB — COMPREHENSIVE METABOLIC PANEL
ALBUMIN: 4.3 g/dL (ref 3.5–5.2)
ALK PHOS: 85 U/L (ref 39–117)
ALT: 39 U/L — ABNORMAL HIGH (ref 0–35)
AST: 21 U/L (ref 0–37)
BUN: 14 mg/dL (ref 6–23)
CALCIUM: 9.5 mg/dL (ref 8.4–10.5)
CO2: 31 mEq/L (ref 19–32)
Chloride: 102 mEq/L (ref 96–112)
Creatinine, Ser: 0.72 mg/dL (ref 0.40–1.20)
GFR: 90.46 mL/min (ref >60.00)
Glucose, Bld: 93 mg/dL (ref 70–99)
POTASSIUM: 3.8 meq/L (ref 3.5–5.1)
SODIUM: 140 meq/L (ref 135–145)
TOTAL PROTEIN: 6.9 g/dL (ref 6.0–8.3)
Total Bilirubin: 1 mg/dL (ref 0.2–1.2)

## 2015-09-10 LAB — LIPASE: Lipase: 23 U/L (ref 11.0–59.0)

## 2015-09-10 LAB — POC URINALSYSI DIPSTICK (AUTOMATED)
Bilirubin, UA: NEGATIVE
Glucose, UA: NEGATIVE
Ketones, UA: NEGATIVE
Nitrite, UA: NEGATIVE
Protein, UA: NEGATIVE
RBC UA: NEGATIVE
Urobilinogen, UA: NEGATIVE
pH, UA: 6

## 2015-09-10 LAB — AMYLASE: AMYLASE: 52 U/L (ref 27–131)

## 2015-09-10 LAB — CBC
HEMATOCRIT: 39.6 % (ref 36.0–46.0)
HEMOGLOBIN: 13.3 g/dL (ref 12.0–15.0)
MCHC: 33.6 g/dL (ref 30.0–36.0)
MCV: 89.4 fl (ref 78.0–100.0)
PLATELETS: 278 10*3/uL (ref 150.0–400.0)
RBC: 4.43 Mil/uL (ref 3.87–5.11)
RDW: 12.9 % (ref 11.5–15.5)
WBC: 8.9 10*3/uL (ref 4.0–10.5)

## 2015-09-10 NOTE — Addendum Note (Signed)
Addended by: Roena MaladyEVONTENNO, Raynah Gomes Y on: 09/10/2015 04:21 PM   Modules accepted: Orders

## 2015-09-10 NOTE — Patient Instructions (Signed)

## 2015-09-10 NOTE — Progress Notes (Signed)
Subjective:    Patient ID: Kelly RocheMelinda M Tran, female    DOB: 01/07/1966, 50 y.o.   MRN: [id number]  HPI  Pt presents to the clinic today with c/o nausea and diarrhea. This started 2 days ago. The nausea is intermittent. She denies vomiting. She was having loose, watery stools, but none since yesterday afternoon. She has had some abdominal pain, mainly is the LLQ. She describes the pain as dull and achy. It can be worse when she goes from a sitting to a standing position. She denies fever, chills or body aches. She denies urinary or vaginal complaints. She denies changes in her diet. She has not had sick contacts that she is aware of.  Review of Systems      Past Medical History  Diagnosis Date  . G E R D 02/22/2007  . Hemorrhoids, internal 01/31/2011  . Migraine   . Genital warts   . Allergy     Current Outpatient Prescriptions  Medication Sig Dispense Refill  . aspirin 81 MG tablet Take 81 mg by mouth daily.    Marland Kitchen. b complex vitamins tablet Take 1 tablet by mouth daily.    . fluticasone (FLONASE) 50 MCG/ACT nasal spray Place 2 sprays into both nostrils daily. 16 g 0  . naproxen (NAPROSYN) 500 MG tablet Take 1 tablet (500 mg total) by mouth 2 (two) times daily with a meal. 60 tablet 0  . polyethylene glycol (MIRALAX / GLYCOLAX) packet Take 17 g by mouth daily.    Marland Kitchen. thyroid (ARMOUR THYROID) 60 MG tablet Take 1 tablet daily on an empty stomach, 90 tablet 0  . Vitamin D, Ergocalciferol, (DRISDOL) 50000 UNITS CAPS capsule Take 1 capsule (50,000 Units total) by mouth every 7 (seven) days. 12 capsule 0  . amitriptyline (ELAVIL) 25 MG tablet TAKE 1 TABLET BY MOUTH ATBEDTIME. (Patient not taking: Reported on 09/10/2015) 90 tablet 0  . Calcium Carbonate-Vitamin D (CALCIUM 500 + D PO) Take 1 tablet by mouth daily. Reported on 09/10/2015     No current facility-administered medications for this visit.    No Known Allergies  Family History  Problem Relation Age of Onset  . Diabetes Neg Hx    family  . Stroke Mother   . Arthritis Mother   . Hypertension Father   . Arthritis Father   . Hyperlipidemia Father   . Cancer Maternal Aunt     ovarian  . Arthritis Maternal Grandmother   . Arthritis Maternal Grandfather   . Arthritis Paternal Grandmother   . Arthritis Paternal Grandfather   . Cancer Maternal Aunt     Breast  . Cancer Maternal Aunt     Lung    Social History   Social History  . Marital Status: Married    Spouse Name: N/A  . Number of Children: N/A  . Years of Education: N/A   Occupational History  . Not on file.   Social History Main Topics  . Smoking status: Former Smoker    Quit date: 01/31/1996  . Smokeless tobacco: Never Used  . Alcohol Use: 0.0 oz/week    0 Standard drinks or equivalent per week     Comment: occ  . Drug Use: No  . Sexual Activity: Yes   Other Topics Concern  . Not on file   Social History Narrative     Constitutional: Denies fever, malaise, fatigue, headache or abrupt weight changes.  Respiratory: Denies difficulty breathing, shortness of breath, cough or sputum production.   Cardiovascular: Denies  chest pain, chest tightness, palpitations or swelling in the hands or feet.  Gastrointestinal: Pt reports abdominal pain, diarrhea. Denies  bloating, constipation, or blood in the stool.  GU: Denies urgency, frequency, pain with urination, burning sensation, blood in urine, odor or discharge.   No other specific complaints in a complete review of systems (except as listed in HPI above).  Objective:   Physical Exam   BP 118/74 mmHg  Pulse 70  Temp(Src) 98.1 F (36.7 C) (Oral)  Wt 189 lb (85.73 kg)  SpO2 98% Wt Readings from Last 3 Encounters:  09/10/15 189 lb (85.73 kg)  08/09/15 190 lb 9.6 oz (86.456 kg)  07/18/15 196 lb (88.905 kg)    General: Appears her stated age, well developed, well nourished in NAD. Cardiovascular: Normal rate and rhythm. S1,S2 noted.  No murmur, rubs or gallops noted.  Pulmonary/Chest:  Normal effort and positive vesicular breath sounds. No respiratory distress. No wheezes, rales or ronchi noted.  Abdomen: Soft and tender in the RUQ. Hypoactive bowel sounds. No distention or masses noted. No CVA tenderness.  BMET    Component Value Date/Time   NA 137 09/25/2014 1001   K 4.0 09/25/2014 1001   CL 102 09/25/2014 1001   CO2 26 09/25/2014 1001   GLUCOSE 99 09/25/2014 1001   BUN 13 09/25/2014 1001   CREATININE 0.71 09/25/2014 1001   CALCIUM 9.8 09/25/2014 1001   GFRNONAA 114.69 04/04/2009 0752   GFRAA 117 02/16/2007 1040    Lipid Panel     Component Value Date/Time   CHOL 168 09/25/2014 1001   TRIG 52.0 09/25/2014 1001   HDL 94.60 09/25/2014 1001   CHOLHDL 2 09/25/2014 1001   VLDL 10.4 09/25/2014 1001   LDLCALC 63 09/25/2014 1001    CBC    Component Value Date/Time   WBC 6.2 09/25/2014 1001   RBC 4.47 09/25/2014 1001   HGB 13.4 09/25/2014 1001   HCT 39.9 09/25/2014 1001   PLT 303.0 09/25/2014 1001   MCV 89.3 09/25/2014 1001   MCHC 33.7 09/25/2014 1001   RDW 12.2 09/25/2014 1001   LYMPHSABS 3.2 05/03/2013 1127   MONOABS 0.7 05/03/2013 1127   EOSABS 0.2 05/03/2013 1127   BASOSABS 0.0 05/03/2013 1127    Hgb A1C No results found for: HGBA1C      Assessment & Plan:   Nausea, diarrhea, LLQ pain:  Urinalysis: 1+ lekuks ? GI bug Colonocscopy from 2010 reviewed- no diverticulosis Will check CBC, CMET, amylase and lipase If persist, consider CT abdomen  RTC as needed or if symptoms persist or worsen, will follow up after labs

## 2015-09-10 NOTE — Progress Notes (Signed)
Pre visit review using our clinic review tool, if applicable. No additional management support is needed unless otherwise documented below in the visit note. 

## 2015-09-26 ENCOUNTER — Other Ambulatory Visit (INDEPENDENT_AMBULATORY_CARE_PROVIDER_SITE_OTHER): Payer: BLUE CROSS/BLUE SHIELD

## 2015-09-26 DIAGNOSIS — E559 Vitamin D deficiency, unspecified: Secondary | ICD-10-CM | POA: Diagnosis not present

## 2015-09-26 LAB — VITAMIN D 25 HYDROXY (VIT D DEFICIENCY, FRACTURES): VITD: 38.23 ng/mL (ref 30.00–100.00)

## 2015-09-28 ENCOUNTER — Encounter: Payer: Self-pay | Admitting: Internal Medicine

## 2015-11-20 ENCOUNTER — Other Ambulatory Visit: Payer: Self-pay | Admitting: Internal Medicine

## 2015-11-20 ENCOUNTER — Encounter: Payer: Self-pay | Admitting: Internal Medicine

## 2015-11-20 ENCOUNTER — Ambulatory Visit (INDEPENDENT_AMBULATORY_CARE_PROVIDER_SITE_OTHER): Payer: BLUE CROSS/BLUE SHIELD | Admitting: Internal Medicine

## 2015-11-20 VITALS — BP 116/78 | HR 72 | Temp 98.2°F | Ht 68.5 in | Wt 196.0 lb

## 2015-11-20 DIAGNOSIS — K648 Other hemorrhoids: Secondary | ICD-10-CM

## 2015-11-20 DIAGNOSIS — Z114 Encounter for screening for human immunodeficiency virus [HIV]: Secondary | ICD-10-CM | POA: Diagnosis not present

## 2015-11-20 DIAGNOSIS — E039 Hypothyroidism, unspecified: Secondary | ICD-10-CM | POA: Diagnosis not present

## 2015-11-20 DIAGNOSIS — J302 Other seasonal allergic rhinitis: Secondary | ICD-10-CM | POA: Diagnosis not present

## 2015-11-20 DIAGNOSIS — E559 Vitamin D deficiency, unspecified: Secondary | ICD-10-CM | POA: Insufficient documentation

## 2015-11-20 DIAGNOSIS — K219 Gastro-esophageal reflux disease without esophagitis: Secondary | ICD-10-CM

## 2015-11-20 DIAGNOSIS — Z1159 Encounter for screening for other viral diseases: Secondary | ICD-10-CM

## 2015-11-20 DIAGNOSIS — G4700 Insomnia, unspecified: Secondary | ICD-10-CM | POA: Insufficient documentation

## 2015-11-20 DIAGNOSIS — Z0000 Encounter for general adult medical examination without abnormal findings: Secondary | ICD-10-CM | POA: Diagnosis not present

## 2015-11-20 DIAGNOSIS — K644 Residual hemorrhoidal skin tags: Secondary | ICD-10-CM

## 2015-11-20 HISTORY — DX: Gastro-esophageal reflux disease without esophagitis: K21.9

## 2015-11-20 LAB — LIPID PANEL
CHOLESTEROL: 160 mg/dL (ref 0–200)
HDL: 76.6 mg/dL (ref >39.00)
LDL Cholesterol: 66 mg/dL (ref 0–99)
NONHDL: 83.48
Total CHOL/HDL Ratio: 2
Triglycerides: 89 mg/dL (ref 0.0–149.0)
VLDL: 17.8 mg/dL (ref 0.0–40.0)

## 2015-11-20 LAB — TSH: TSH: 3.57 u[IU]/mL (ref 0.35–4.50)

## 2015-11-20 LAB — VITAMIN D 25 HYDROXY (VIT D DEFICIENCY, FRACTURES): VITD: 23.18 ng/mL — ABNORMAL LOW (ref 30.00–100.00)

## 2015-11-20 LAB — T4, FREE: FREE T4: 0.56 ng/dL — AB (ref 0.60–1.60)

## 2015-11-20 MED ORDER — FLUTICASONE PROPIONATE 50 MCG/ACT NA SUSP
2.0000 | Freq: Every day | NASAL | Status: DC
Start: 2015-11-20 — End: 2016-12-03

## 2015-11-20 NOTE — Addendum Note (Signed)
Addended by: Liane ComberHAVERS, Cyan Clippinger C on: 11/20/2015 04:03 PM   Modules accepted: Kipp BroodSmartSet

## 2015-11-20 NOTE — Progress Notes (Signed)
Pre visit review using our clinic review tool, if applicable. No additional management support is needed unless otherwise documented below in the visit note. 

## 2015-11-20 NOTE — Assessment & Plan Note (Signed)
Will check Vit D today 

## 2015-11-20 NOTE — Patient Instructions (Signed)
Health Maintenance, Female Adopting a healthy lifestyle and getting preventive care can go a long way to promote health and wellness. Talk with your health care provider about what schedule of regular examinations is right for you. This is a good chance for you to check in with your provider about disease prevention and staying healthy. In between checkups, there are plenty of things you can do on your own. Experts have done a lot of research about which lifestyle changes and preventive measures are most likely to keep you healthy. Ask your health care provider for more information. WEIGHT AND DIET  Eat a healthy diet  Be sure to include plenty of vegetables, fruits, low-fat dairy products, and lean protein.  Do not eat a lot of foods high in solid fats, added sugars, or salt.  Get regular exercise. This is one of the most important things you can do for your health.  Most adults should exercise for at least 150 minutes each week. The exercise should increase your heart rate and make you sweat (moderate-intensity exercise).  Most adults should also do strengthening exercises at least twice a week. This is in addition to the moderate-intensity exercise.  Maintain a healthy weight  Body mass index (BMI) is a measurement that can be used to identify possible weight problems. It estimates body fat based on height and weight. Your health care provider can help determine your BMI and help you achieve or maintain a healthy weight.  For females 20 years of age and older:   A BMI below 18.5 is considered underweight.  A BMI of 18.5 to 24.9 is normal.  A BMI of 25 to 29.9 is considered overweight.  A BMI of 30 and above is considered obese.  Watch levels of cholesterol and blood lipids  You should start having your blood tested for lipids and cholesterol at 52 years of age, then have this test every 5 years.  You may need to have your cholesterol levels checked more often if:  Your lipid  or cholesterol levels are high.  You are older than 52 years of age.  You are at high risk for heart disease.  CANCER SCREENING   Lung Cancer  Lung cancer screening is recommended for adults 55-80 years old who are at high risk for lung cancer because of a history of smoking.  A yearly low-dose CT scan of the lungs is recommended for people who:  Currently smoke.  Have quit within the past 15 years.  Have at least a 30-pack-year history of smoking. A pack year is smoking an average of one pack of cigarettes a day for 1 year.  Yearly screening should continue until it has been 15 years since you quit.  Yearly screening should stop if you develop a health problem that would prevent you from having lung cancer treatment.  Breast Cancer  Practice breast self-awareness. This means understanding how your breasts normally appear and feel.  It also means doing regular breast self-exams. Let your health care provider know about any changes, no matter how small.  If you are in your 20s or 30s, you should have a clinical breast exam (CBE) by a health care provider every 1-3 years as part of a regular health exam.  If you are 40 or older, have a CBE every year. Also consider having a breast X-ray (mammogram) every year.  If you have a family history of breast cancer, talk to your health care provider about genetic screening.  If you   are at high risk for breast cancer, talk to your health care provider about having an MRI and a mammogram every year.  Breast cancer gene (BRCA) assessment is recommended for women who have family members with BRCA-related cancers. BRCA-related cancers include:  Breast.  Ovarian.  Tubal.  Peritoneal cancers.  Results of the assessment will determine the need for genetic counseling and BRCA1 and BRCA2 testing. Cervical Cancer Your health care provider may recommend that you be screened regularly for cancer of the pelvic organs (ovaries, uterus, and  vagina). This screening involves a pelvic examination, including checking for microscopic changes to the surface of your cervix (Pap test). You may be encouraged to have this screening done every 3 years, beginning at age 21.  For women ages 30-65, health care providers may recommend pelvic exams and Pap testing every 3 years, or they may recommend the Pap and pelvic exam, combined with testing for human papilloma virus (HPV), every 5 years. Some types of HPV increase your risk of cervical cancer. Testing for HPV may also be done on women of any age with unclear Pap test results.  Other health care providers may not recommend any screening for nonpregnant women who are considered low risk for pelvic cancer and who do not have symptoms. Ask your health care provider if a screening pelvic exam is right for you.  If you have had past treatment for cervical cancer or a condition that could lead to cancer, you need Pap tests and screening for cancer for at least 20 years after your treatment. If Pap tests have been discontinued, your risk factors (such as having a new sexual partner) need to be reassessed to determine if screening should resume. Some women have medical problems that increase the chance of getting cervical cancer. In these cases, your health care provider may recommend more frequent screening and Pap tests. Colorectal Cancer  This type of cancer can be detected and often prevented.  Routine colorectal cancer screening usually begins at 52 years of age and continues through 52 years of age.  Your health care provider may recommend screening at an earlier age if you have risk factors for colon cancer.  Your health care provider may also recommend using home test kits to check for hidden blood in the stool.  A small camera at the end of a tube can be used to examine your colon directly (sigmoidoscopy or colonoscopy). This is done to check for the earliest forms of colorectal  cancer.  Routine screening usually begins at age 50.  Direct examination of the colon should be repeated every 5-10 years through 52 years of age. However, you may need to be screened more often if early forms of precancerous polyps or small growths are found. Skin Cancer  Check your skin from head to toe regularly.  Tell your health care provider about any new moles or changes in moles, especially if there is a change in a mole's shape or color.  Also tell your health care provider if you have a mole that is larger than the size of a pencil eraser.  Always use sunscreen. Apply sunscreen liberally and repeatedly throughout the day.  Protect yourself by wearing long sleeves, pants, a wide-brimmed hat, and sunglasses whenever you are outside. HEART DISEASE, DIABETES, AND HIGH BLOOD PRESSURE   High blood pressure causes heart disease and increases the risk of stroke. High blood pressure is more likely to develop in:  People who have blood pressure in the high end   of the normal range (130-139/85-89 mm Hg).  People who are overweight or obese.  People who are African American.  If you are 38-23 years of age, have your blood pressure checked every 3-5 years. If you are 61 years of age or older, have your blood pressure checked every year. You should have your blood pressure measured twice--once when you are at a hospital or clinic, and once when you are not at a hospital or clinic. Record the average of the two measurements. To check your blood pressure when you are not at a hospital or clinic, you can use:  An automated blood pressure machine at a pharmacy.  A home blood pressure monitor.  If you are between 45 years and 39 years old, ask your health care provider if you should take aspirin to prevent strokes.  Have regular diabetes screenings. This involves taking a blood sample to check your fasting blood sugar level.  If you are at a normal weight and have a low risk for diabetes,  have this test once every three years after 52 years of age.  If you are overweight and have a high risk for diabetes, consider being tested at a younger age or more often. PREVENTING INFECTION  Hepatitis B  If you have a higher risk for hepatitis B, you should be screened for this virus. You are considered at high risk for hepatitis B if:  You were born in a country where hepatitis B is common. Ask your health care provider which countries are considered high risk.  Your parents were born in a high-risk country, and you have not been immunized against hepatitis B (hepatitis B vaccine).  You have HIV or AIDS.  You use needles to inject street drugs.  You live with someone who has hepatitis B.  You have had sex with someone who has hepatitis B.  You get hemodialysis treatment.  You take certain medicines for conditions, including cancer, organ transplantation, and autoimmune conditions. Hepatitis C  Blood testing is recommended for:  Everyone born from [birth year] through 1967.  Anyone with known risk factors for hepatitis C. Sexually transmitted infections (STIs)  You should be screened for sexually transmitted infections (STIs) including gonorrhea and chlamydia if:  You are sexually active and are younger than 52 years of age.  You are older than 53 years of age and your health care provider tells you that you are at risk for this type of infection.  Your sexual activity has changed since you were last screened and you are at an increased risk for chlamydia or gonorrhea. Ask your health care provider if you are at risk.  If you do not have HIV, but are at risk, it may be recommended that you take a prescription medicine daily to prevent HIV infection. This is called pre-exposure prophylaxis (PrEP). You are considered at risk if:  You are sexually active and do not regularly use condoms or know the HIV status of your partner(s).  You take drugs by injection.  You are sexually  active with a partner who has HIV. Talk with your health care provider about whether you are at high risk of being infected with HIV. If you choose to begin PrEP, you should first be tested for HIV. You should then be tested every 3 months for as long as you are taking PrEP.  PREGNANCY   If you are premenopausal and you may become pregnant, ask your health care provider about preconception counseling.  If you may  become pregnant, take 400 to 800 micrograms (mcg) of folic acid every day.  If you want to prevent pregnancy, talk to your health care provider about birth control (contraception). OSTEOPOROSIS AND MENOPAUSE   Osteoporosis is a disease in which the bones lose minerals and strength with aging. This can result in serious bone fractures. Your risk for osteoporosis can be identified using a bone density scan.  If you are 61 years of age or older, or if you are at risk for osteoporosis and fractures, ask your health care provider if you should be screened.  Ask your health care provider whether you should take a calcium or vitamin D supplement to lower your risk for osteoporosis.  Menopause may have certain physical symptoms and risks.  Hormone replacement therapy may reduce some of these symptoms and risks. Talk to your health care provider about whether hormone replacement therapy is right for you.  HOME CARE INSTRUCTIONS   Schedule regular health, dental, and eye exams.  Stay current with your immunizations.   Do not use any tobacco products including cigarettes, chewing tobacco, or electronic cigarettes.  If you are pregnant, do not drink alcohol.  If you are breastfeeding, limit how much and how often you drink alcohol.  Limit alcohol intake to no more than 1 drink per day for nonpregnant women. One drink equals 12 ounces of beer, 5 ounces of wine, or 1 ounces of hard liquor.  Do not use street drugs.  Do not share needles.  Ask your health care provider for help if  you need support or information about quitting drugs.  Tell your health care provider if you often feel depressed.  Tell your health care provider if you have ever been abused or do not feel safe at home.   This information is not intended to replace advice given to you by your health care provider. Make sure you discuss any questions you have with your health care provider.   Document Released: 01/06/2011 Document Revised: 07/14/2014 Document Reviewed: 05/25/2013 Elsevier Interactive Patient Education [employer].

## 2015-11-20 NOTE — Assessment & Plan Note (Signed)
TSH and T4 today She will continue to follow with Dr. Lucianne MussKumar Continue Armour Thyroid at current dose unless directed otherwise by Dr. Lucianne MussKumar

## 2015-11-20 NOTE — Assessment & Plan Note (Signed)
Diet controlled Continue to avoid triggers 

## 2015-11-20 NOTE — Assessment & Plan Note (Signed)
Continue Flonase as needed

## 2015-11-20 NOTE — Progress Notes (Signed)
HPI  Pt presents to the clinic today for her annual exam. She is also due for follow up of chronic conditions.  GERD: Occassional. Triggered by tomato based products and wine. She did switch to a vegan diet which did seem to help, but symptoms returned after she stopped paying attention to her diet. She does not take any OTC medications.  Constipation with Hemorrhoids: She takes Mirilax daily. Colonoscopy in 2010, repeat in 10 years.  Allergies: Seasonal. She takes Flonase when her symptoms flare.  Hypothyroid: Taking Armour Thyroid daily. She denies s/s of hypothyroidism on current dose of Armour Thyroid. She follows with Dr. Lucianne Muss. Last labs reviewed.  She reports she feels very fatigued. She sleeps 7-8 hours of sleep at night but does not feel like it is restful. She has trouble falling asleep and staying asleep. She has a normal sleep study many years ago. She has tried Tylenol PM OTC.  Flu: never Tetanus: 2010 Pap Smear: 06/2015, normal at [facility] Mammogram:06/2015, normal at [facility] Colon Screening: 2010, 10 years Vision Screening: yearly Dentist: biannually  Diet: She does eat meat. She consumes fruits and veggies daily. She tries to avoid fried foods. She drinks mostly water. Exercise: She is trying to weight lifting and running 1 day per week.  Past Medical History  Diagnosis Date  . G E R D 02/22/2007  . Hemorrhoids, internal 01/31/2011  . Migraine   . Genital warts   . Allergy     Current Outpatient Prescriptions  Medication Sig Dispense Refill  . amitriptyline (ELAVIL) 25 MG tablet TAKE 1 TABLET BY MOUTH ATBEDTIME. 90 tablet 0  . aspirin 81 MG tablet Take 81 mg by mouth daily.    Marland Kitchen b complex vitamins tablet Take 1 tablet by mouth daily.    . Calcium Carbonate-Vitamin D (CALCIUM 500 + D PO) Take 1 tablet by mouth daily. Reported on 09/10/2015    . fluticasone (FLONASE) 50 MCG/ACT nasal spray Place 2 sprays into both nostrils daily. 16 g 0  . naproxen  (NAPROSYN) 500 MG tablet Take 1 tablet (500 mg total) by mouth 2 (two) times daily with a meal. 60 tablet 0  . polyethylene glycol (MIRALAX / GLYCOLAX) packet Take 17 g by mouth daily.    Marland Kitchen thyroid (ARMOUR THYROID) 60 MG tablet Take 1 tablet daily on an empty stomach, 90 tablet 0  . Vitamin D, Ergocalciferol, (DRISDOL) 50000 UNITS CAPS capsule Take 1 capsule (50,000 Units total) by mouth every 7 (seven) days. 12 capsule 0   No current facility-administered medications for this visit.    No Known Allergies  Family History  Problem Relation Age of Onset  . Diabetes Neg Hx     family  . Stroke Mother   . Arthritis Mother   . Hypertension Father   . Arthritis Father   . Hyperlipidemia Father   . Cancer Maternal Aunt     ovarian  . Arthritis Maternal Grandmother   . Arthritis Maternal Grandfather   . Arthritis Paternal Grandmother   . Arthritis Paternal Grandfather   . Cancer Maternal Aunt     Breast  . Cancer Maternal Aunt     Lung    Social History   Social History  . Marital Status: Married    Spouse Name: N/A  . Number of Children: N/A  . Years of Education: N/A   Occupational History  . Not on file.   Social History Main Topics  . Smoking status: Former Smoker  Quit date: 01/31/1996  . Smokeless tobacco: Never Used  . Alcohol Use: 0.0 oz/week    0 Standard drinks or equivalent per week     Comment: occ  . Drug Use: No  . Sexual Activity: Yes   Other Topics Concern  . Not on file   Social History Narrative    ROS:  Constitutional: Denies fever, malaise, fatigue, headache or abrupt weight changes.  HEENT: Denies eye pain, eye redness, ear pain, ringing in the ears, wax buildup, runny nose, nasal congestion, bloody nose, or sore throat. Respiratory: Denies difficulty breathing, shortness of breath, cough or sputum production.   Cardiovascular: Denies chest pain, chest tightness, palpitations or swelling in the hands or feet.  Gastrointestinal: Pt reports  alternating constipation, diarrhea and hemorrhoids. Denies abdominal pain, bloating, diarrhea.  GU: Denies frequency, urgency, pain with urination, blood in urine, odor or discharge. Musculoskeletal: Denies decrease in range of motion, difficulty with gait, muscle pain or joint pain and swelling.  Skin: Pt reports hair loss. Denies redness, rashes, lesions or ulcercations.  Neurological: Denies dizziness, difficulty with memory, difficulty with speech or problems with balance and coordination.  Psych: Pt reports stress and anxiety. Denies depression, SI/HI.  No other specific complaints in a complete review of systems (except as listed in HPI above).  PE:  BP 116/78 mmHg  Pulse 72  Temp(Src) 98.2 F (36.8 C) (Oral)  Ht 5' 8.5" (1.74 m)  Wt 196 lb (88.905 kg)  BMI 29.36 kg/m2  SpO2 98%  Wt Readings from Last 3 Encounters:  09/10/15 189 lb (85.73 kg)  08/09/15 190 lb 9.6 oz (86.456 kg)  07/18/15 196 lb (88.905 kg)    General: Appears her stated age, well developed, well nourished in NAD. HEENT: Head: normal shape and size; Eyes: sclera white, no icterus, conjunctiva pink, PERRLA and EOMs intact; Ears: Tm's gray and intact, normal light reflex; Throat/Mouth: Teeth present, mucosa pink and moist, no lesions or ulcerations noted.  Neck:  Neck supple, trachea midline. No masses, lumps or thyromegaly present.  Cardiovascular: Normal rate and rhythm. S1,S2 noted.  No murmur, rubs or gallops noted. No JVD or BLE edema.  Pulmonary/Chest: Normal effort and positive vesicular breath sounds. No respiratory distress. No wheezes, rales or ronchi noted.  Abdomen: Soft and nontender. Normal bowel sounds. No distention or masses noted. Liver, spleen and kidneys non palpable. Musculoskeletal: Strength 5/5 BUE/BLE. No signs of joint swelling. Strength 5/5 BUE/BLE. No difficulty with gait.  Neurological: Alert and oriented. Cranial nerves II-XII grossly intact. Coordination normal.  Psychiatric: Mood  and affect normal. Behavior is normal. Judgment and thought content normal.    BMET    Component Value Date/Time   NA 140 09/10/2015 1444   K 3.8 09/10/2015 1444   CL 102 09/10/2015 1444   CO2 31 09/10/2015 1444   GLUCOSE 93 09/10/2015 1444   BUN 14 09/10/2015 1444   CREATININE 0.72 09/10/2015 1444   CALCIUM 9.5 09/10/2015 1444   GFRNONAA 114.69 04/04/2009 0752   GFRAA 117 02/16/2007 1040    Lipid Panel     Component Value Date/Time   CHOL 168 09/25/2014 1001   TRIG 52.0 09/25/2014 1001   HDL 94.60 09/25/2014 1001   CHOLHDL 2 09/25/2014 1001   VLDL 10.4 09/25/2014 1001   LDLCALC 63 09/25/2014 1001    CBC    Component Value Date/Time   WBC 8.9 09/10/2015 1444   RBC 4.43 09/10/2015 1444   HGB 13.3 09/10/2015 1444   HCT 39.6 09/10/2015  1444   PLT 278.0 09/10/2015 1444   MCV 89.4 09/10/2015 1444   MCHC 33.6 09/10/2015 1444   RDW 12.9 09/10/2015 1444   LYMPHSABS 3.2 05/03/2013 1127   MONOABS 0.7 05/03/2013 1127   EOSABS 0.2 05/03/2013 1127   BASOSABS 0.0 05/03/2013 1127    Hgb A1C No results found for: HGBA1C   Assessment and Plan:  Preventative Health Maintenance:  She declines flu shot today Tetanus UTD Pap and Mammogram UTD- will request copy Encouraged her to work on diet and exercise Encourage her to see an eye doctor and dentist at least annually Will check HIV, Hep C, Lipid Profile today  RTC in 1 year

## 2015-11-20 NOTE — Assessment & Plan Note (Signed)
Discussed increasing exercise If persist, consider Trazadone

## 2015-11-20 NOTE — Assessment & Plan Note (Signed)
Secondary to constipation Continue daily Miralax

## 2015-11-21 LAB — HEPATITIS C ANTIBODY: HCV AB: NEGATIVE

## 2015-11-21 LAB — HIV ANTIBODY (ROUTINE TESTING W REFLEX): HIV: NONREACTIVE

## 2015-11-27 ENCOUNTER — Encounter: Payer: Self-pay | Admitting: Internal Medicine

## 2015-12-04 ENCOUNTER — Other Ambulatory Visit: Payer: BLUE CROSS/BLUE SHIELD

## 2015-12-07 ENCOUNTER — Ambulatory Visit: Payer: BLUE CROSS/BLUE SHIELD | Admitting: Endocrinology

## 2015-12-20 ENCOUNTER — Telehealth: Payer: Self-pay | Admitting: Endocrinology

## 2015-12-20 MED ORDER — THYROID 60 MG PO TABS: ORAL_TABLET | ORAL | Status: DC

## 2015-12-20 NOTE — Telephone Encounter (Signed)
Rx submitted

## 2015-12-20 NOTE — Telephone Encounter (Signed)
Pt needs some more armour thyroid she does not have enough to make it to the appt

## 2015-12-28 ENCOUNTER — Other Ambulatory Visit (INDEPENDENT_AMBULATORY_CARE_PROVIDER_SITE_OTHER): Payer: BLUE CROSS/BLUE SHIELD

## 2015-12-28 DIAGNOSIS — E038 Other specified hypothyroidism: Secondary | ICD-10-CM | POA: Diagnosis not present

## 2015-12-28 DIAGNOSIS — E063 Autoimmune thyroiditis: Secondary | ICD-10-CM

## 2015-12-28 LAB — T3, FREE: T3, Free: 4 pg/mL (ref 2.3–4.2)

## 2015-12-28 LAB — TSH: TSH: 3.28 u[IU]/mL (ref 0.35–4.50)

## 2015-12-28 LAB — T4, FREE: Free T4: 0.67 ng/dL (ref 0.60–1.60)

## 2016-01-02 ENCOUNTER — Encounter: Payer: Self-pay | Admitting: Endocrinology

## 2016-01-02 ENCOUNTER — Ambulatory Visit (INDEPENDENT_AMBULATORY_CARE_PROVIDER_SITE_OTHER): Payer: BLUE CROSS/BLUE SHIELD | Admitting: Endocrinology

## 2016-01-02 VITALS — BP 122/84 | HR 63 | Ht 68.5 in | Wt 198.0 lb

## 2016-01-02 DIAGNOSIS — E038 Other specified hypothyroidism: Secondary | ICD-10-CM | POA: Diagnosis not present

## 2016-01-02 DIAGNOSIS — E063 Autoimmune thyroiditis: Secondary | ICD-10-CM

## 2016-01-02 NOTE — Progress Notes (Signed)
Patient ID: Kelly RocheMelinda M Tran, female   DOB: 09/17/1965, 50 y.o.   MRN: [id number]   Reason for Appointment:  Hypothyroidism, followup visit   History of Present Illness:   The hypothyroidism was first diagnosed in 11/2011 Initially her hypothyroidism was diagnosed with symptoms of hair loss for several years and also fatigue and cold intolerance Notably her TSH at baseline was only 4.7 On her initial consultation she had been still having the symptoms above with taking 50 mcg of levothyroxine  With changing to Armour Thyroid 45 mg daily in 07/2012 her symptoms of fatigue and cold intolerance were improved However on her follow-up visit in 12/16 her TSH was 5.9 She was having symptoms of fatigue as usual along with some chronic hair loss  Since increasing her Armour Thyroid to 60 mg daily in 12/16 she was now complaining about palpitations at times including at night. However this resolved with her stopping amitriptyline as directed in 2/17  RECENT history: Currently she is not having any unusual fatigue, does have mild tiredness which is not unusual She is trying to start walking and other exercise recently but has gained weight She is trying to deal with stress on her own without using the amitriptyline that she was taking Has mild chronic hair loss She does have some core sensitivity but only at work  Compliance with the medical regimen has been as prescribed with taking the tablet in the morning before breakfast.  Her TSH is stable in the normal range, free T3 is upper normal   Wt Readings from Last 3 Encounters:  01/02/16 198 lb (89.812 kg)  11/20/15 196 lb (88.905 kg)  09/10/15 189 lb (85.73 kg)    Lab Results  Component Value Date   TSH 3.28 12/28/2015   TSH 3.57 11/20/2015   TSH 2.74 08/06/2015   FREET4 0.67 12/28/2015   FREET4 0.56* 11/20/2015   FREET4 0.58* 08/06/2015    Lab Results  Component Value Date   T3FREE 4.0 12/28/2015   T3FREE 3.0  08/06/2015   T3FREE 3.7 06/21/2015         Medication List       This list is accurate as of: 01/02/16  9:06 AM.  Always use your most recent med list.               aspirin 81 MG tablet  Take 81 mg by mouth daily.     b complex vitamins tablet  Take 1 tablet by mouth daily.     CALCIUM 500 + D PO  Take 1 tablet by mouth daily. Reported on 09/10/2015     fluticasone 50 MCG/ACT nasal spray  Commonly known as:  FLONASE  Place 2 sprays into both nostrils daily.     polyethylene glycol packet  Commonly known as:  MIRALAX / GLYCOLAX  Take 17 g by mouth daily.     thyroid 60 MG tablet  Commonly known as:  ARMOUR THYROID  Take 1 tablet daily on an empty stomach,        Past Medical History  Diagnosis Date  . G E R D 02/22/2007  . Hemorrhoids, internal 01/31/2011  . Migraine   . Genital warts   . Allergy     Past Surgical History  Procedure Laterality Date  . Appendectomy    . Miscarriage      x3--required d and c  . Dilation and curettage of uterus      Family History  Problem Relation Age  of Onset  . Diabetes Neg Hx     family  . Stroke Mother   . Arthritis Mother   . Hypertension Father   . Arthritis Father   . Hyperlipidemia Father   . Cancer Maternal Aunt     ovarian  . Arthritis Maternal Grandmother   . Arthritis Maternal Grandfather   . Arthritis Paternal Grandmother   . Arthritis Paternal Grandfather   . Cancer Maternal Aunt     Breast  . Cancer Maternal Aunt     Lung    Social History:  reports that she quit smoking about 19 years ago. She has never used smokeless tobacco. She reports that she drinks alcohol. She reports that she does not use illicit drugs.  Allergies: No Known Allergies  ROS   She has a history of snoring, only mild daytime sleepiness She thinks her sleep study was normal about 5 years ago  BP Readings from Last 3 Encounters:  01/02/16 122/84  11/20/15 116/78  09/10/15 118/74      Examination:   BP 122/84  mmHg  Pulse 63  Ht 5' 8.5" (1.74 m)  Wt 198 lb (89.812 kg)  BMI 29.66 kg/m2  SpO2 98%   General: looks well.      Thyroid not palpable         NEUROLOGIC EXAM:  biceps reflexes normal, 2+ bilaterally. Skin the normal No peripheral edema    Assessment/ Plan:  Hypothyroidism, controlled with taking Armour Thyroid 60 mg daily She has been on a stable dose since 12/16 Subjectively doing well now  Although her free T3 level is high normal she does not complain of any palpitations or shakiness and her pulse is fairly good Palpitations resolved after stopping amitriptyline  She is compliant with her medication and responded continue this, follow-up in 6 months  Albin Duckett 01/02/2016, 9:06 AM

## 2016-01-03 ENCOUNTER — Ambulatory Visit (INDEPENDENT_AMBULATORY_CARE_PROVIDER_SITE_OTHER): Payer: BLUE CROSS/BLUE SHIELD | Admitting: Internal Medicine

## 2016-01-03 ENCOUNTER — Encounter: Payer: Self-pay | Admitting: Internal Medicine

## 2016-01-03 VITALS — BP 106/70 | HR 69 | Temp 98.1°F | Wt 196.0 lb

## 2016-01-03 DIAGNOSIS — I889 Nonspecific lymphadenitis, unspecified: Secondary | ICD-10-CM

## 2016-01-03 MED ORDER — CEPHALEXIN 500 MG PO CAPS: 500.0000 mg | ORAL_CAPSULE | Freq: Four times a day (QID) | ORAL | Status: DC

## 2016-01-03 NOTE — Progress Notes (Signed)
Pre visit review using our clinic review tool, if applicable. No additional management support is needed unless otherwise documented below in the visit note. 

## 2016-01-03 NOTE — Patient Instructions (Signed)
Start the antibiotic only if the gland gets bigger and more tender.

## 2016-01-03 NOTE — Progress Notes (Signed)
   Subjective:    Patient ID: Kelly Tran, female    DOB: 10/27/1965, 50 y.o.   MRN: [id number]  HPI Here due to a swollen area on the back of her head  Noticed it yesterday Bothers her--tender and feels swollen Has nodule in it  No fever No trauma  Current Outpatient Prescriptions on File Prior to Visit  Medication Sig Dispense Refill  . aspirin 81 MG tablet Take 81 mg by mouth daily.    Marland Kitchen. b complex vitamins tablet Take 1 tablet by mouth daily.    . Calcium Carbonate-Vitamin D (CALCIUM 500 + D PO) Take 1 tablet by mouth daily. Reported on 09/10/2015    . fluticasone (FLONASE) 50 MCG/ACT nasal spray Place 2 sprays into both nostrils daily. 16 g 0  . polyethylene glycol (MIRALAX / GLYCOLAX) packet Take 17 g by mouth daily.    Marland Kitchen. thyroid (ARMOUR THYROID) 60 MG tablet Take 1 tablet daily on an empty stomach, 30 tablet 0   No current facility-administered medications on file prior to visit.    No Known Allergies  Past Medical History  Diagnosis Date  . G E R D 02/22/2007  . Hemorrhoids, internal 01/31/2011  . Migraine   . Genital warts   . Allergy     Past Surgical History  Procedure Laterality Date  . Appendectomy    . Miscarriage      x3--required d and c  . Dilation and curettage of uterus      Family History  Problem Relation Age of Onset  . Diabetes Neg Hx     family  . Stroke Mother   . Arthritis Mother   . Hypertension Father   . Arthritis Father   . Hyperlipidemia Father   . Cancer Maternal Aunt     ovarian  . Arthritis Maternal Grandmother   . Arthritis Maternal Grandfather   . Arthritis Paternal Grandmother   . Arthritis Paternal Grandfather   . Cancer Maternal Aunt     Breast  . Cancer Maternal Aunt     Lung    Social History   Social History  . Marital Status: Married    Spouse Name: N/A  . Number of Children: N/A  . Years of Education: N/A   Occupational History  . Not on file.   Social History Main Topics  . Smoking status: Former  Smoker    Quit date: 01/31/1996  . Smokeless tobacco: Never Used  . Alcohol Use: 0.0 oz/week    0 Standard drinks or equivalent per week     Comment: occ  . Drug Use: No  . Sexual Activity: Yes   Other Topics Concern  . Not on file   Social History Narrative   Review of Systems  No teeth problems No ear symptoms or pain Had something on the back of her head about 3 weeks ago--?cyst     Objective:   Physical Exam  Neck:  Small right occipital node palpable--not really enlarged or inflamed but is tender Has scabbed lesion above and to left of this          Assessment & Plan:

## 2016-01-03 NOTE — Assessment & Plan Note (Signed)
Mild and I suspect related to what may be a bite (the scabbed area) Discussed supportive care Cephalexin Rx for in case it worsened

## 2016-01-21 ENCOUNTER — Other Ambulatory Visit: Payer: Self-pay | Admitting: Endocrinology

## 2016-01-21 ENCOUNTER — Other Ambulatory Visit: Payer: Self-pay | Admitting: Obstetrics and Gynecology

## 2016-01-21 DIAGNOSIS — R229 Localized swelling, mass and lump, unspecified: Principal | ICD-10-CM

## 2016-01-21 DIAGNOSIS — IMO0002 Reserved for concepts with insufficient information to code with codable children: Secondary | ICD-10-CM

## 2016-01-23 ENCOUNTER — Ambulatory Visit
Admission: RE | Admit: 2016-01-23 | Discharge: 2016-01-23 | Disposition: A | Discharge status: Home / Self Care | Payer: BLUE CROSS/BLUE SHIELD | Source: Ambulatory Visit | Attending: Obstetrics and Gynecology | Admitting: Obstetrics and Gynecology

## 2016-01-23 DIAGNOSIS — IMO0002 Reserved for concepts with insufficient information to code with codable children: Secondary | ICD-10-CM

## 2016-01-23 DIAGNOSIS — R229 Localized swelling, mass and lump, unspecified: Principal | ICD-10-CM

## 2016-01-23 IMAGING — MG 2D DIGITAL DIAGNOSTIC UNILATERAL LEFT MAMMOGRAM WITH CAD AND ADJ
6 series · 6 of 14 positions shown · non-contrast
Comparison: With priors.

CLINICAL DATA: 50-year-old female states she previously felt a
palpable abnormality in the left breast. She cannot feel the
palpable abnormality today.

EXAM:
2D DIGITAL DIAGNOSTIC LEFT MAMMOGRAM WITH CAD AND ADJUNCT TOMO
ULTRASOUND LEFT BREAST

[L CC]
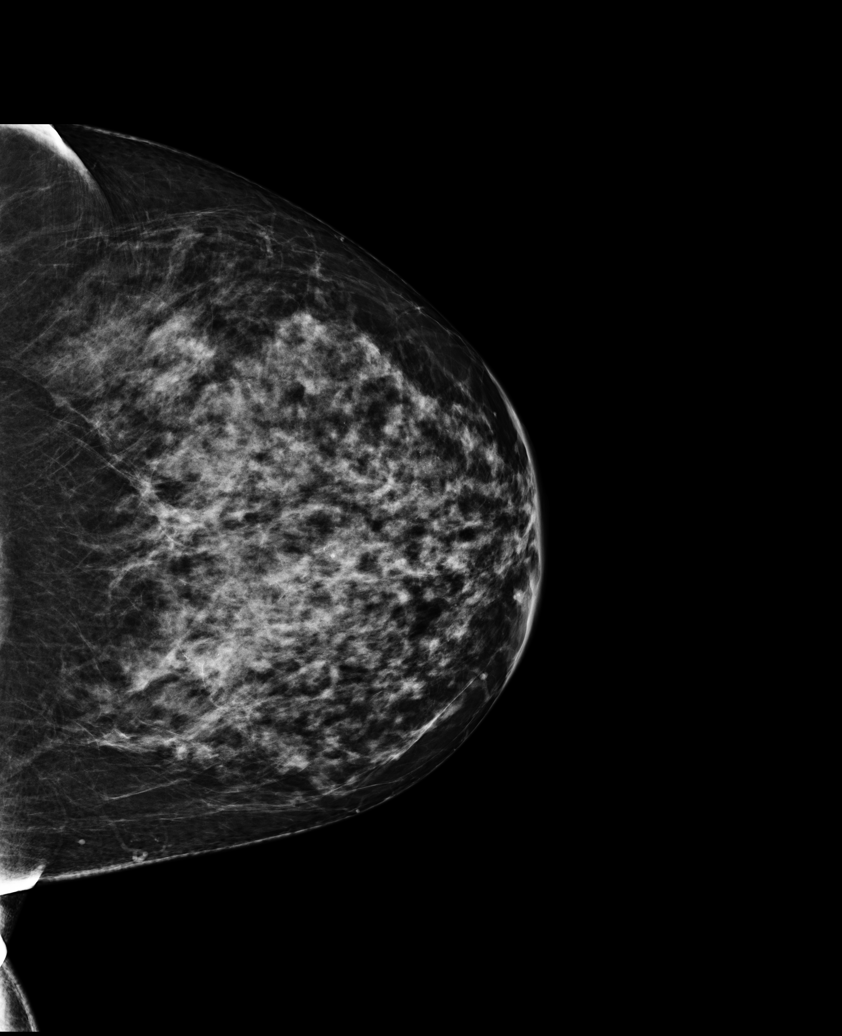

[L MLO synth-2D]
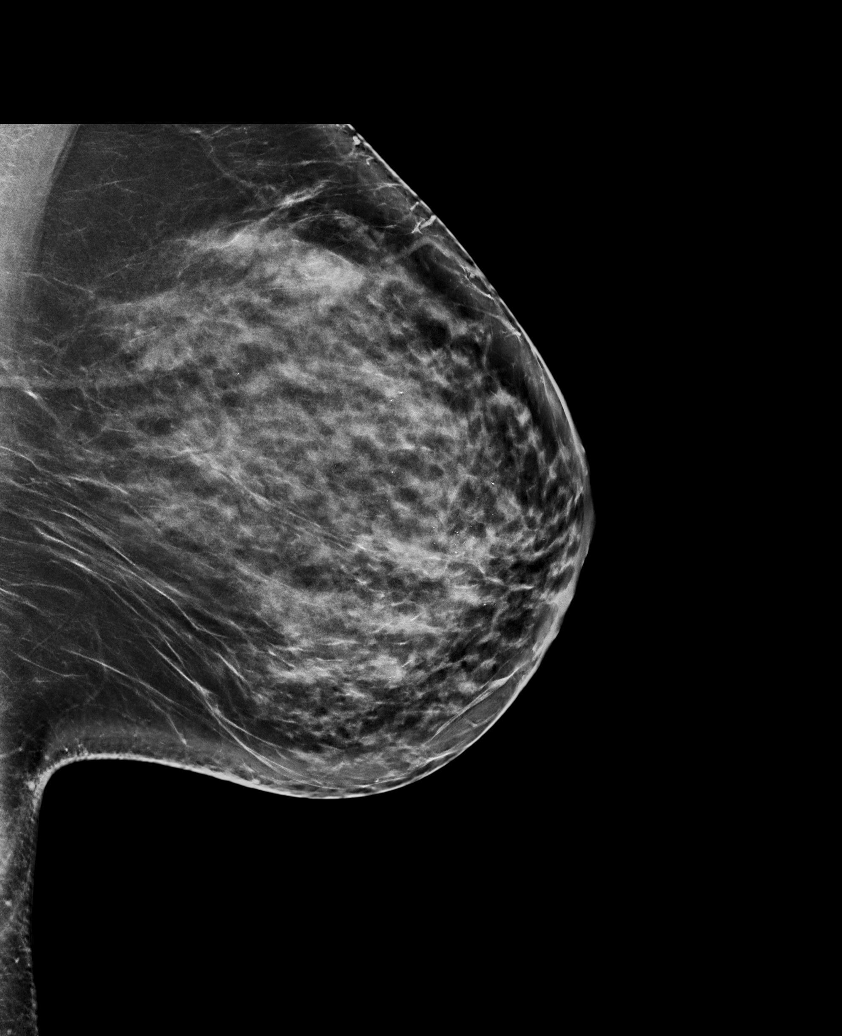

[L MLO]
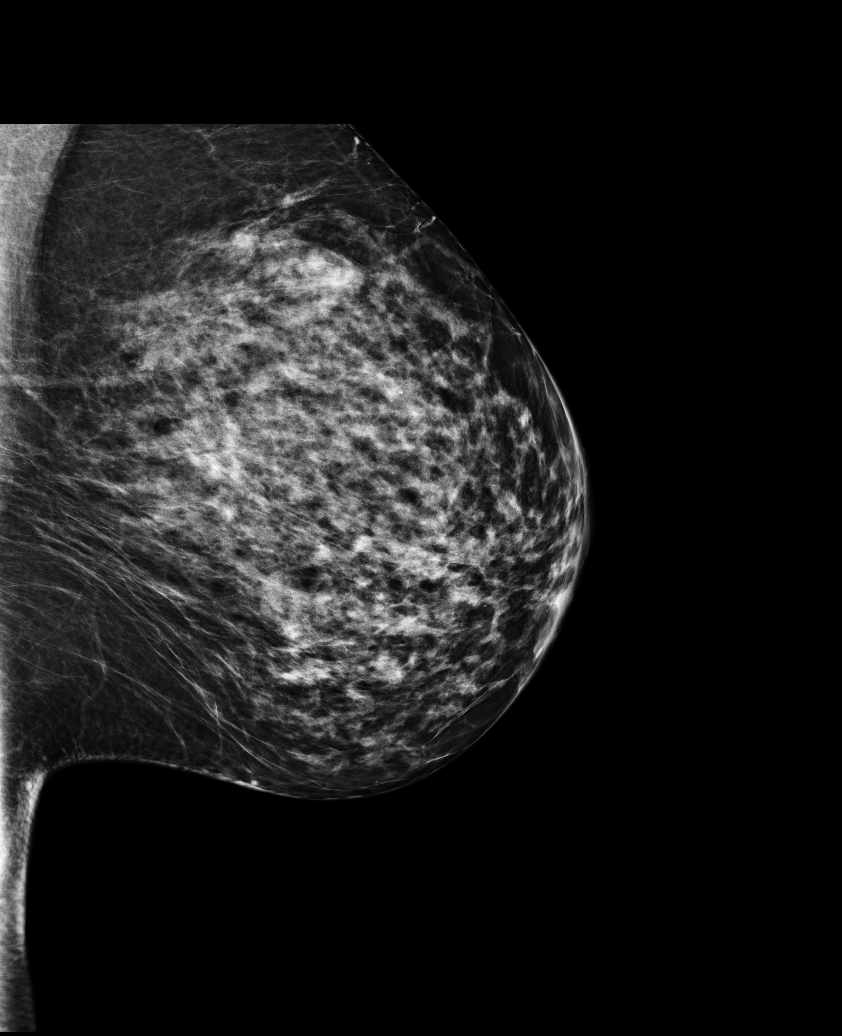

[L CC synth-2D]
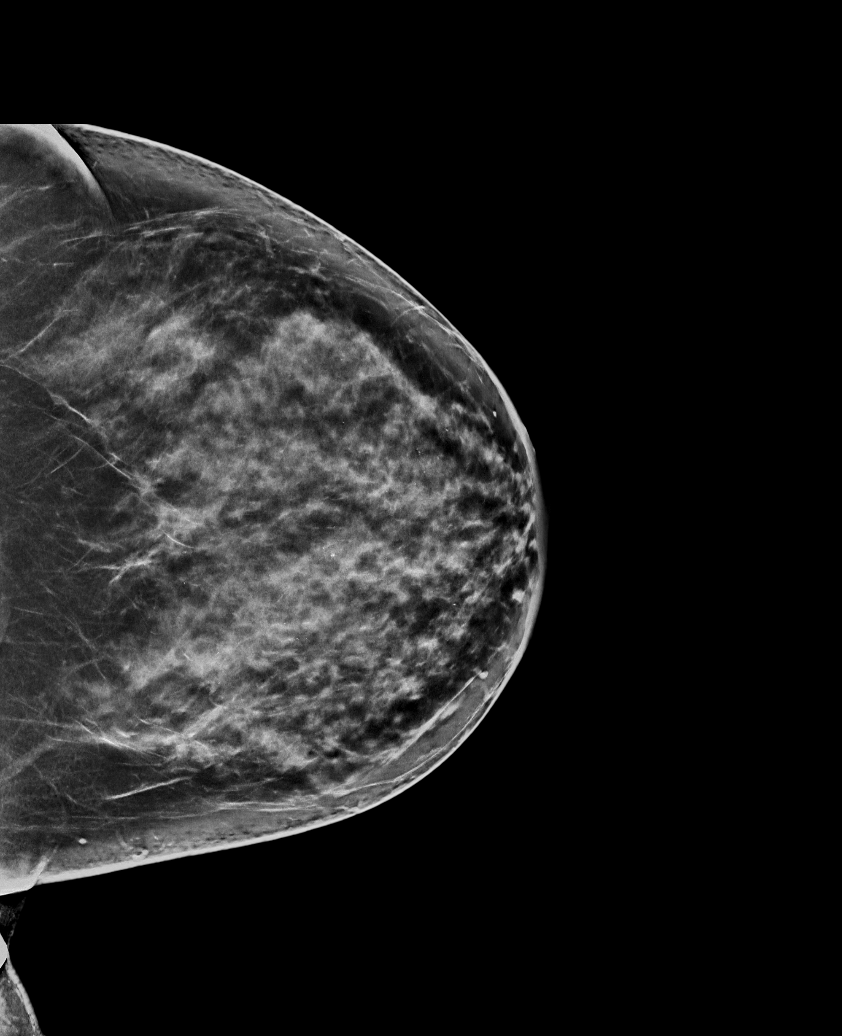

[L CC tomo · tomo slice 47/94.0]
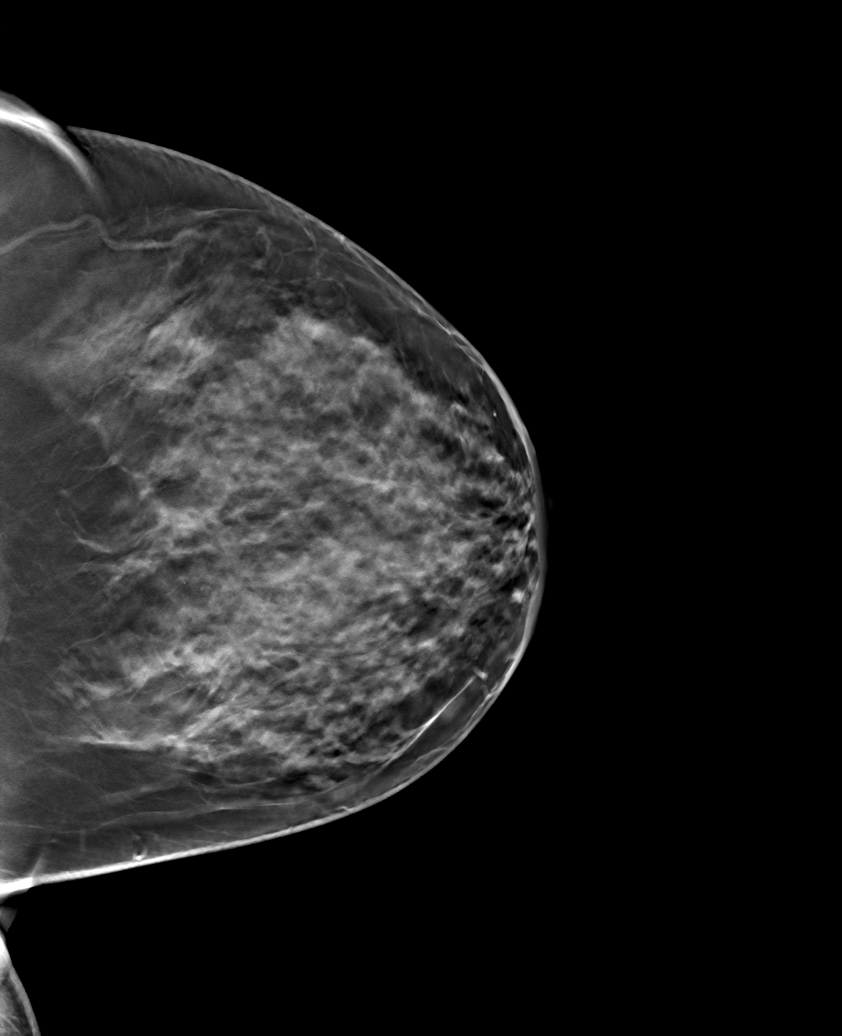

[L MLO tomo · tomo slice 46/91.0]
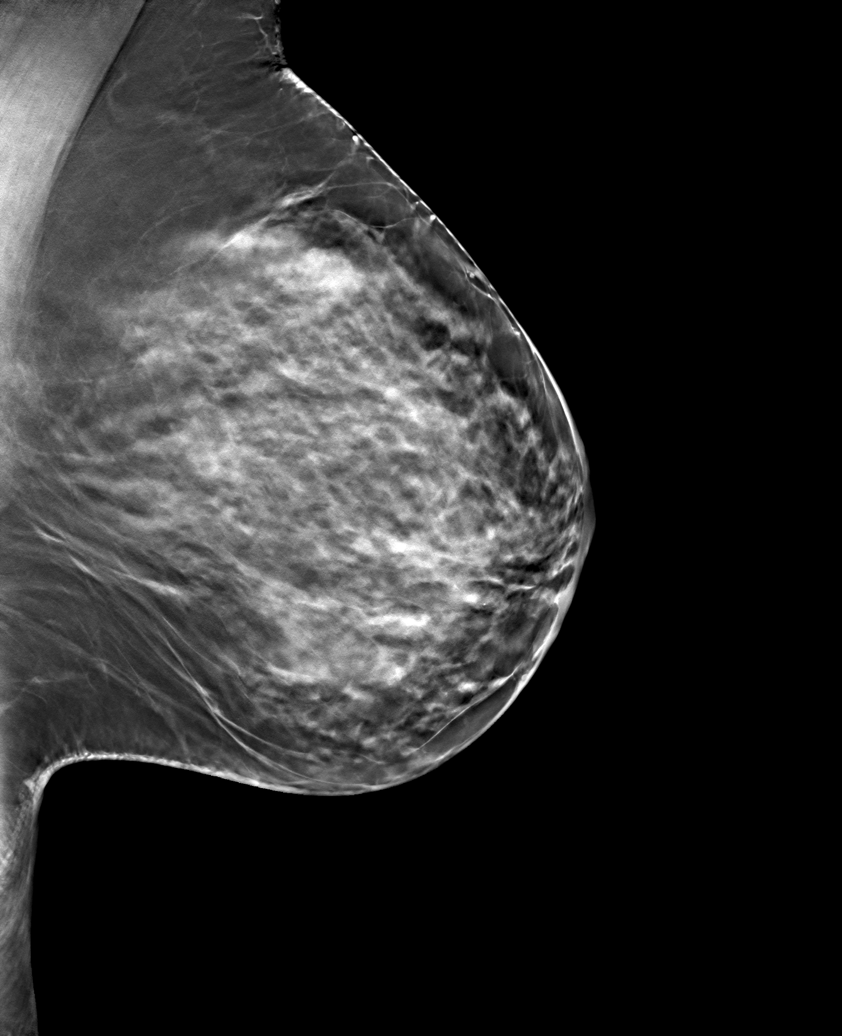

[6 of 14 positions shown; findings below may reference images not displayed]

ACR Breast Density Category c: The breast tissue is heterogeneously
dense, which may obscure small masses.
FINDINGS: No suspicious mass, malignant type microcalcifications or distortion
detected.

Mammographic images were processed with CAD.

On physical exam, I do not palpate a discrete mass in the 12 o'clock
region of the left breast 1-2 cm above the level of the nipple.

Targeted ultrasound is performed, showing normal tissue in the 12
o'clock region of the left breast. No solid or cystic mass, abnormal
shadowing or distortion detected.
IMPRESSION: No evidence of malignancy in the left breast.

RECOMMENDATION:
Bilateral screening mammogram in [DATE] is recommended.

I have discussed the findings and recommendations with the patient.
Results were also provided in writing at the conclusion of the
visit. If applicable, a reminder letter will be sent to the patient
regarding the next appointment.

BI-RADS CATEGORY  1: Negative.

## 2016-02-07 ENCOUNTER — Encounter: Payer: Self-pay | Admitting: Internal Medicine

## 2016-02-07 ENCOUNTER — Ambulatory Visit (INDEPENDENT_AMBULATORY_CARE_PROVIDER_SITE_OTHER): Payer: BLUE CROSS/BLUE SHIELD | Admitting: Internal Medicine

## 2016-02-07 VITALS — BP 118/80 | HR 74 | Temp 98.1°F | Wt 198.0 lb

## 2016-02-07 DIAGNOSIS — M6249 Contracture of muscle, multiple sites: Secondary | ICD-10-CM

## 2016-02-07 DIAGNOSIS — M62838 Other muscle spasm: Secondary | ICD-10-CM

## 2016-02-07 MED ORDER — CYCLOBENZAPRINE HCL 5 MG PO TABS: 5.0000 mg | ORAL_TABLET | Freq: Three times a day (TID) | ORAL | 0 refills | Status: DC | PRN

## 2016-02-07 NOTE — Patient Instructions (Signed)

## 2016-02-07 NOTE — Progress Notes (Signed)
Subjective:    Patient ID: Kelly Tran, female    DOB: Jul 21, 1965, 50 y.o.   MRN: [id number]  HPI  Pt presents to the clinic today with c/o muscle spasms in her neck. This started 2 days ago. She describes the pain as stiffness and feels like a "knot" at the base of her skull. She describes the pain as 2-3/10. The right side seems worse than the left. The pain does radiate into her right arm but she denies numbness, tingling or weakness. She is having difficulty lifting her right arm above her head. 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 She has had similar symptoms like this in the past and had good relief with Flexeril.  Review of Systems      Past Medical History:  Diagnosis Date  . Allergy   . G E R D 02/22/2007  . Genital warts   . Hemorrhoids, internal 01/31/2011  . Migraine     Current Outpatient Prescriptions  Medication Sig Dispense Refill  . ARMOUR THYROID 60 MG tablet TAKE 1 TABLET BY MOUTH DAILY ON AN EMPTY STOMACH 30 tablet 5  . aspirin 81 MG tablet Take 81 mg by mouth daily.    Marland Kitchen b complex vitamins tablet Take 1 tablet by mouth daily.    . Calcium Carbonate-Vitamin D (CALCIUM 500 + D PO) Take 1 tablet by mouth daily. Reported on 09/10/2015    . fluticasone (FLONASE) 50 MCG/ACT nasal spray Place 2 sprays into both nostrils daily. 16 g 0  . polyethylene glycol (MIRALAX / GLYCOLAX) packet Take 17 g by mouth daily.     No current facility-administered medications for this visit.     No Known Allergies  Family History  Problem Relation Age of Onset  . Diabetes Neg Hx     family  . Stroke Mother   . Arthritis Mother   . Hypertension Father   . Arthritis Father   . Hyperlipidemia Father   . Cancer Maternal Aunt     ovarian  . Arthritis Maternal Grandmother   . Arthritis Maternal Grandfather   . Arthritis  Paternal Grandmother   . Arthritis Paternal Grandfather   . Cancer Maternal Aunt     Breast  . Cancer Maternal Aunt     Lung    Social History   Social History  . Marital status: Married    Spouse name: N/A  . Number of children: N/A  . Years of education: N/A   Occupational History  . Not on file.   Social History Main Topics  . Smoking status: Former Smoker    Quit date: 01/31/1996  . Smokeless tobacco: Never Used  . Alcohol use 0.0 oz/week     Comment: occ  . Drug use: No  . Sexual activity: Yes   Other Topics Concern  . Not on file   Social History Narrative  . No narrative on file     Constitutional: Denies fever, malaise, fatigue, headache or abrupt weight changes.  Musculoskeletal: Pt reports neck pain. Denies difficulty with gait, or joint pain and swelling.  Skin: Denies redness, rashes, lesions or ulcercations.  Neurological: Denies dizziness, difficulty with memory, difficulty with speech or problems with balance and coordination.    No other specific complaints in a complete review of systems (except as listed  in HPI above).  Objective:   Physical Exam  BP 118/80   Pulse 74   Temp 98.1 F (36.7 C) (Oral)   Wt 198 lb (89.8 kg)   SpO2 98%   BMI 29.67 kg/m  Wt Readings from Last 3 Encounters:  02/07/16 198 lb (89.8 kg)  01/03/16 196 lb (88.9 kg)  01/02/16 198 lb (89.8 kg)    General: Appears her stated age,  in NAD. Skin: Warm, dry and intact. No rashes, lesions or ulcerations noted. Cardiovascular: Normal rate and rhythm. S1,S2 noted.  No murmur, rubs or gallops noted.  Pulmonary/Chest: Normal effort and positive vesicular breath sounds. No respiratory distress. No wheezes, rales or ronchi noted.  Musculoskeletal: Normal flexion and extension of the cervical spine. Pain with rotation and lateral bending. Pain with palpation of the right paracervical muscles. Normal internal and external rotation of the left shoulder. Decreased internal and  external rotation of the right shoulder. Strength 5/5 BUE. Neurological: Alert and oriented.    BMET    Component Value Date/Time   NA 140 09/10/2015 1444   K 3.8 09/10/2015 1444   CL 102 09/10/2015 1444   CO2 31 09/10/2015 1444   GLUCOSE 93 09/10/2015 1444   BUN 14 09/10/2015 1444   CREATININE 0.72 09/10/2015 1444   CALCIUM 9.5 09/10/2015 1444   GFRNONAA 114.69 04/04/2009 0752   GFRAA 117 02/16/2007 1040    Lipid Panel     Component Value Date/Time   CHOL 160 11/20/2015 1459   TRIG 89.0 11/20/2015 1459   HDL 76.60 11/20/2015 1459   CHOLHDL 2 11/20/2015 1459   VLDL 17.8 11/20/2015 1459   LDLCALC 66 11/20/2015 1459    CBC    Component Value Date/Time   WBC 8.9 09/10/2015 1444   RBC 4.43 09/10/2015 1444   HGB 13.3 09/10/2015 1444   HCT 39.6 09/10/2015 1444   PLT 278.0 09/10/2015 1444   MCV 89.4 09/10/2015 1444   MCHC 33.6 09/10/2015 1444   RDW 12.9 09/10/2015 1444   LYMPHSABS 3.2 05/03/2013 1127   MONOABS 0.7 05/03/2013 1127   EOSABS 0.2 05/03/2013 1127   BASOSABS 0.0 05/03/2013 1127    Hgb A1C No results found for: HGBA1C          Assessment & Plan:   Paracervical muscle strain:  Advised her to use a heating pad to see if it helps Stretching exercises given eRx for Flexeril 5 mg TID prn  RTC as needed or if symptoms persist or worsen Zoran Yankee, NP

## 2016-06-05 ENCOUNTER — Ambulatory Visit (INDEPENDENT_AMBULATORY_CARE_PROVIDER_SITE_OTHER): Payer: BLUE CROSS/BLUE SHIELD | Admitting: Family Medicine

## 2016-06-05 ENCOUNTER — Encounter: Payer: Self-pay | Admitting: Family Medicine

## 2016-06-05 VITALS — BP 120/60 | HR 70 | Temp 97.5°F | Wt 198.0 lb

## 2016-06-05 DIAGNOSIS — G8929 Other chronic pain: Secondary | ICD-10-CM | POA: Diagnosis not present

## 2016-06-05 DIAGNOSIS — M25512 Pain in left shoulder: Secondary | ICD-10-CM | POA: Diagnosis not present

## 2016-06-05 NOTE — Patient Instructions (Signed)
Please see Kelly Tran who will help you schedule your appointment with Dr.Barnes

## 2016-06-05 NOTE — Progress Notes (Signed)
   Subjective:    Patient ID: Kelly Tran, female    DOB: 02/26/1966, 50 y.o.   MRN: [id number]  HPI This is a 50 yo female who presents today with left shoulder pain x 1 month. She was shears to trim bushes when she felt a pop. Pain has gotten progressively worse, hurts all the time, pain is throbbing. Has used creams and ibuprofen periodically- 400 mg without relief. No numbness or tingling, arm feels weak, struggles to lift arm. Unable to lift anything.   Past Medical History:  Diagnosis Date  . Allergy   . G E R D 02/22/2007  . Genital warts   . Hemorrhoids, internal 01/31/2011  . Migraine    Past Surgical History:  Procedure Laterality Date  . APPENDECTOMY    . DILATION AND CURETTAGE OF UTERUS    . miscarriage     x3--required d and c   Family History  Problem Relation Age of Onset  . Stroke Mother   . Arthritis Mother   . Hypertension Father   . Arthritis Father   . Hyperlipidemia Father   . Cancer Maternal Aunt     ovarian  . Arthritis Maternal Grandmother   . Arthritis Maternal Grandfather   . Arthritis Paternal Grandmother   . Arthritis Paternal Grandfather   . Cancer Maternal Aunt     Breast  . Cancer Maternal Aunt     Lung  . Diabetes Neg Hx     family   Social History  Substance Use Topics  . Smoking status: Former Smoker    Quit date: 01/31/1996  . Smokeless tobacco: Never Used  . Alcohol use 0.0 oz/week     Comment: occ     Review of Systems Per HPI    Objective:   Physical Exam  Constitutional: She is oriented to person, place, and time. She appears well-developed and well-nourished. No distress.  HENT:  Head: Normocephalic and atraumatic.  Cardiovascular: Normal rate.   Pulmonary/Chest: Effort normal.  Musculoskeletal:       Left shoulder: She exhibits decreased range of motion (generalized) and tenderness (anterior/lateral.).  Neurological: She is alert and oriented to person, place, and time.  Skin: Skin is warm and dry. She is not  diaphoretic.  Psychiatric: She has a normal mood and affect. Her behavior is normal. Judgment and thought content normal.  Vitals reviewed.     BP 120/60   Pulse 70   Temp 97.5 F (36.4 C) (Oral)   Wt 198 lb (89.8 kg)   LMP 03/11/2013   SpO2 98%   BMI 29.67 kg/m  Wt Readings from Last 3 Encounters:  06/05/16 198 lb (89.8 kg)  02/07/16 198 lb (89.8 kg)  01/03/16 196 lb (88.9 kg)       Assessment & Plan:  1. Chronic left shoulder pain - given length of symptoms, mechanism of injury and no improvement with NSAIDs, will refer to ortho - Ambulatory referral to Orthopedic Surgery  Olean Reeeborah Gessner, FNP-BC  [facility], [facility]  06/05/2016 1:28 PM

## 2016-06-05 NOTE — Progress Notes (Signed)
Pre visit review using our clinic review tool, if applicable. No additional management support is needed unless otherwise documented below in the visit note. 

## 2016-06-10 DIAGNOSIS — M25512 Pain in left shoulder: Secondary | ICD-10-CM | POA: Insufficient documentation

## 2016-06-16 ENCOUNTER — Ambulatory Visit: Payer: BLUE CROSS/BLUE SHIELD

## 2016-06-16 ENCOUNTER — Other Ambulatory Visit: Payer: Self-pay | Admitting: Obstetrics and Gynecology

## 2016-06-16 DIAGNOSIS — Z1231 Encounter for screening mammogram for malignant neoplasm of breast: Secondary | ICD-10-CM

## 2016-06-17 ENCOUNTER — Other Ambulatory Visit: Payer: BLUE CROSS/BLUE SHIELD

## 2016-06-20 ENCOUNTER — Ambulatory Visit: Payer: BLUE CROSS/BLUE SHIELD | Admitting: Endocrinology

## 2016-06-23 ENCOUNTER — Ambulatory Visit
Admission: RE | Admit: 2016-06-23 | Discharge: 2016-06-23 | Disposition: A | Discharge status: Home / Self Care | Payer: Managed Care, Other (non HMO) | Source: Ambulatory Visit | Attending: Obstetrics and Gynecology | Admitting: Obstetrics and Gynecology

## 2016-06-23 DIAGNOSIS — Z1231 Encounter for screening mammogram for malignant neoplasm of breast: Secondary | ICD-10-CM

## 2016-06-23 IMAGING — MG 2D DIGITAL SCREENING BILATERAL MAMMOGRAM WITH CAD AND ADJUNCT TO
8 of 12 series · 8 of 28 positions shown · non-contrast
Comparison: Previous exam(s).

CLINICAL DATA: Screening.

EXAM:
2D DIGITAL SCREENING BILATERAL MAMMOGRAM WITH CAD AND ADJUNCT TOMO

[L CC]
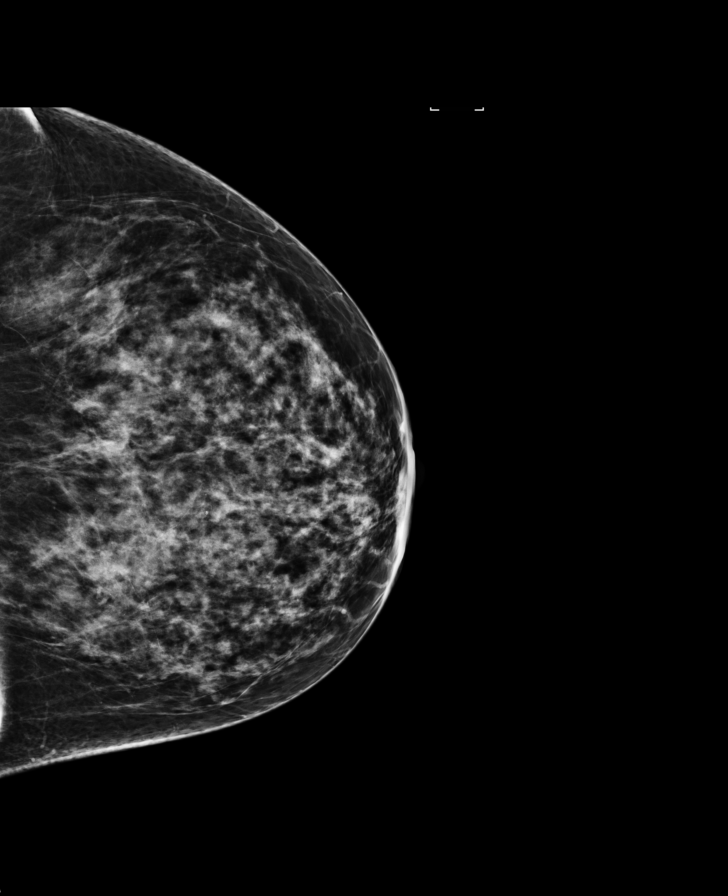

[R MLO]
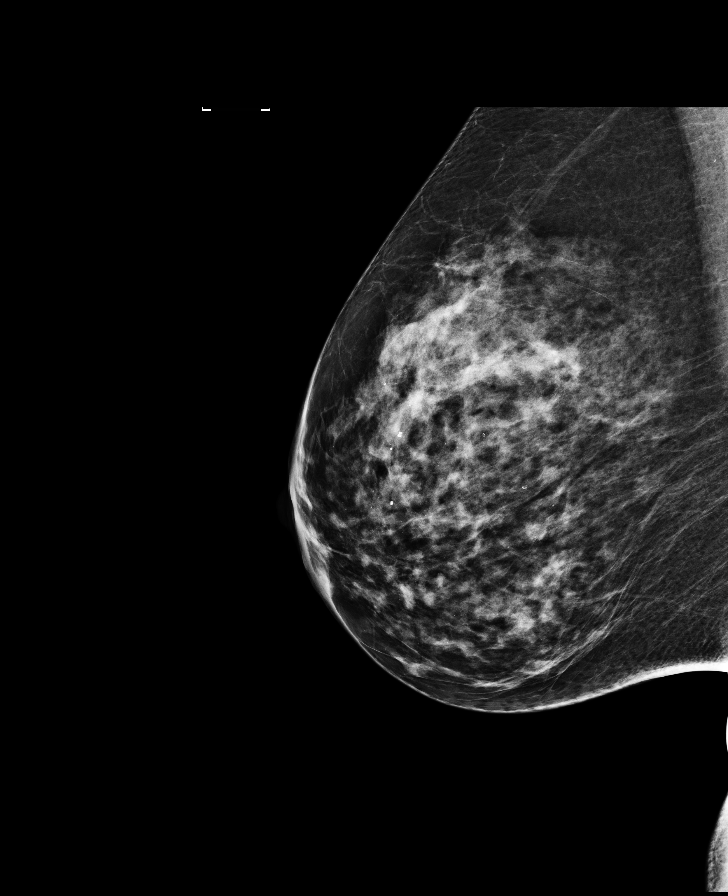

[L MLO synth-2D]
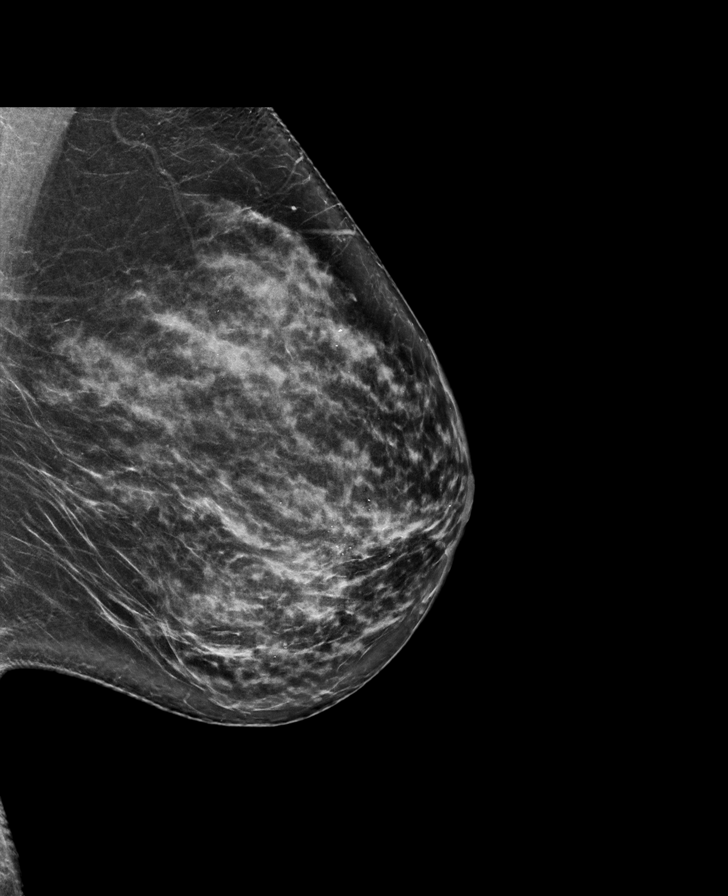

[R MLO synth-2D]
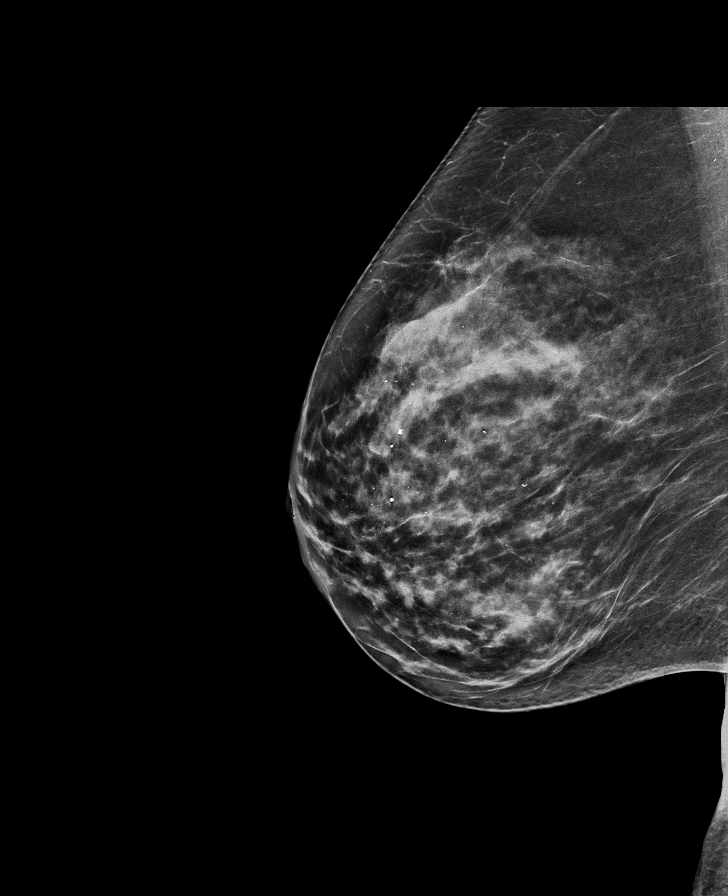

[L CC synth-2D]
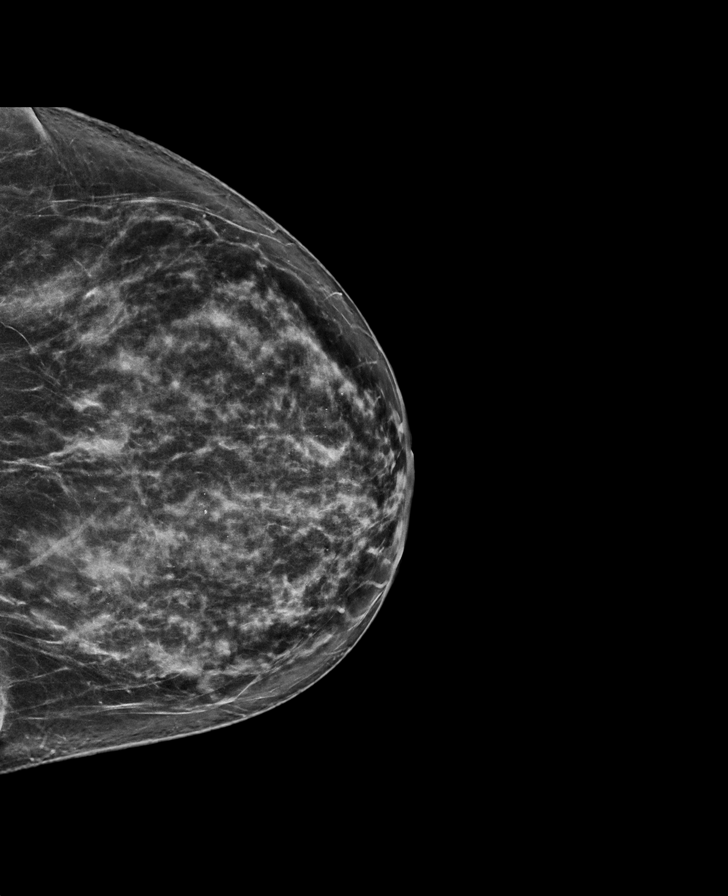

[L MLO]
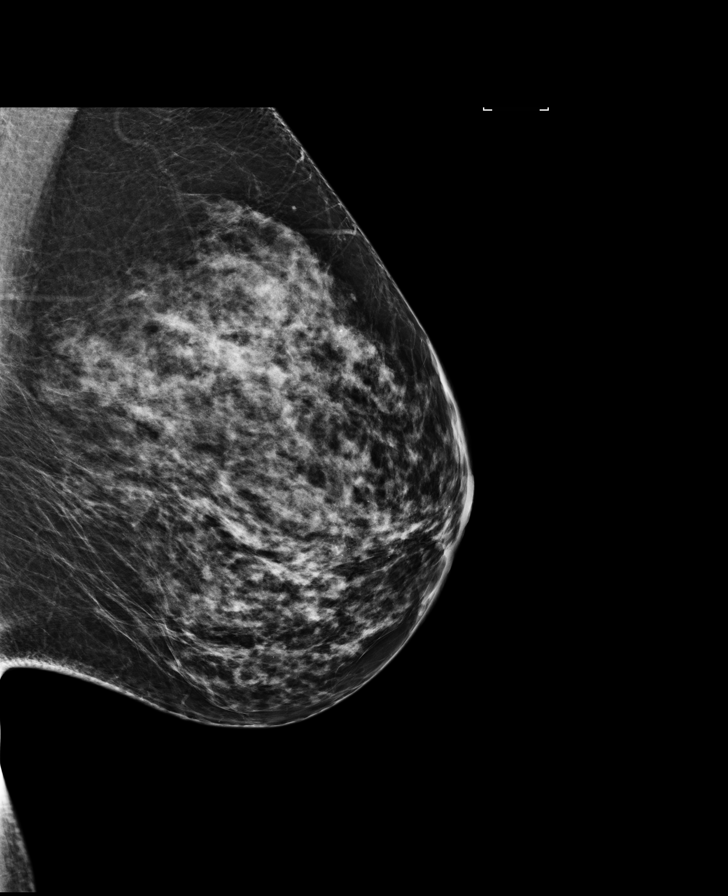

[R CC]
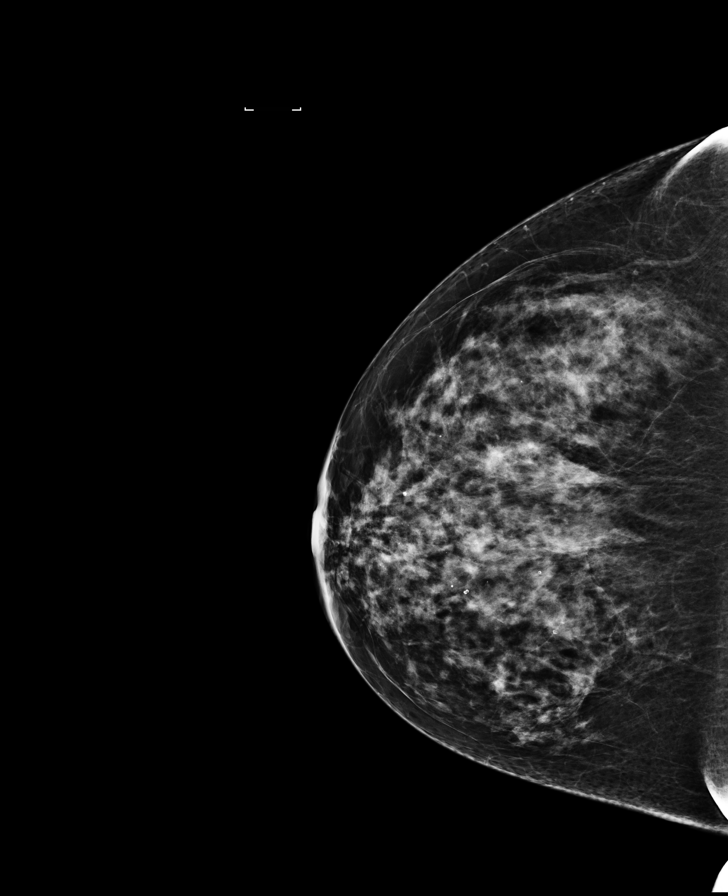

[R CC synth-2D]
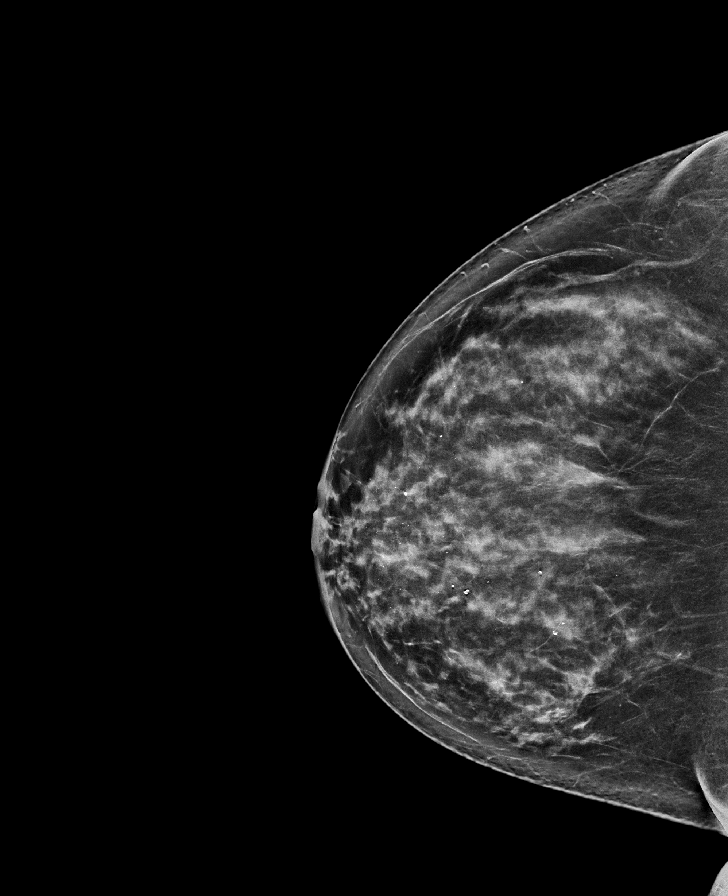

[8 of 28 positions shown; findings below may reference images not displayed]

ACR Breast Density Category c: The breast tissue is heterogeneously
dense, which may obscure small masses.
FINDINGS: There are no findings suspicious for malignancy. Images were
processed with CAD.
IMPRESSION: No mammographic evidence of malignancy. A result letter of this
screening mammogram will be mailed directly to the patient.

RECOMMENDATION:
Screening mammogram in one year. (Code:[TA])

BI-RADS CATEGORY  1: Negative.

## 2016-06-25 ENCOUNTER — Other Ambulatory Visit: Payer: Self-pay | Admitting: Obstetrics and Gynecology

## 2016-06-25 ENCOUNTER — Ambulatory Visit (INDEPENDENT_AMBULATORY_CARE_PROVIDER_SITE_OTHER): Payer: Managed Care, Other (non HMO) | Admitting: Family Medicine

## 2016-06-25 ENCOUNTER — Encounter: Payer: Self-pay | Admitting: Family Medicine

## 2016-06-25 VITALS — BP 122/74 | HR 77 | Temp 99.2°F | Ht 68.5 in | Wt 199.5 lb

## 2016-06-25 DIAGNOSIS — J019 Acute sinusitis, unspecified: Secondary | ICD-10-CM | POA: Insufficient documentation

## 2016-06-25 DIAGNOSIS — J0110 Acute frontal sinusitis, unspecified: Secondary | ICD-10-CM | POA: Diagnosis not present

## 2016-06-25 MED ORDER — AMOXICILLIN-POT CLAVULANATE 875-125 MG PO TABS: 1.0000 | ORAL_TABLET | Freq: Two times a day (BID) | ORAL | 0 refills | Status: DC

## 2016-06-25 NOTE — Progress Notes (Signed)
Subjective:    Patient ID: Kelly RocheMelinda M Tran, female    DOB: 05/15/1966, 50 y.o.   MRN: [id number]  HPI Here with uri symptoms ?  Started yesterday - L side of face is hurting /pressure like/ sinus pain  Also L ear hurt   Hurts to touch it  If she moves head quickly will get sharp pains  Yesterday ran along cheek bone into her ear and today sensitive under eye   No rash at all  Never had shingle   Does not feel very congested No pnd  Cannot get her ear to pop however  No altitude changes or air travel   Now the discomfort is down in her throat   Last night took [employer] "mucous relief"  flonase  Allegra   A little cough - nothing productive  Thinks she had elevated temp  Chills last night   Does not get flu shots   Patient Active Problem List   Diagnosis Date Noted  . Acute sinusitis 06/25/2016  . GERD (gastroesophageal reflux disease) 11/20/2015  . Insomnia 11/20/2015  . Vitamin D deficiency 11/20/2015  . Seasonal allergies 09/25/2014  . External hemorrhoid 04/26/2013  . Hypothyroidism 03/22/2013   Past Medical History:  Diagnosis Date  . Allergy   . G E R D 02/22/2007  . Genital warts   . Hemorrhoids, internal 01/31/2011  . Migraine    Past Surgical History:  Procedure Laterality Date  . APPENDECTOMY    . DILATION AND CURETTAGE OF UTERUS    . miscarriage     x3--required d and c   Social History  Substance Use Topics  . Smoking status: Former Smoker    Quit date: 01/31/1996  . Smokeless tobacco: Never Used  . Alcohol use 0.0 oz/week     Comment: occ   Family History  Problem Relation Age of Onset  . Stroke Mother   . Arthritis Mother   . Hypertension Father   . Arthritis Father   . Hyperlipidemia Father   . Cancer Maternal Aunt     ovarian  . Arthritis Maternal Grandmother   . Arthritis Maternal Grandfather   . Arthritis Paternal Grandmother   . Arthritis Paternal Grandfather   . Cancer Maternal Aunt     Breast  . Cancer Maternal Aunt       Lung  . Diabetes Neg Hx     family   No Known Allergies Current Outpatient Prescriptions on File Prior to Visit  Medication Sig Dispense Refill  . ARMOUR THYROID 60 MG tablet TAKE 1 TABLET BY MOUTH DAILY ON AN EMPTY STOMACH 30 tablet 5  . aspirin 81 MG tablet Take 81 mg by mouth daily.    Marland Kitchen. b complex vitamins tablet Take 1 tablet by mouth daily.    . Calcium Carbonate-Vitamin D (CALCIUM 500 + D PO) Take 1 tablet by mouth daily. Reported on 09/10/2015    . cyclobenzaprine (FLEXERIL) 5 MG tablet Take 1 tablet (5 mg total) by mouth 3 (three) times daily as needed for muscle spasms. 20 tablet 0  . fluticasone (FLONASE) 50 MCG/ACT nasal spray Place 2 sprays into both nostrils daily. 16 g 0   No current facility-administered medications on file prior to visit.      Review of Systems Review of Systems  Constitutional: Negative for fever, appetite change, and unexpected weight change. pos for low grade temp (not at fever level) ENT pos for L sided facial/sinus pain and pressure with L ear pain ,  neg for ST, neg for facial rash Eyes: Negative for pain and visual disturbance.  Respiratory: Negative for wheeze and shortness of breath.   Cardiovascular: Negative for cp or palpitations    Gastrointestinal: Negative for nausea, diarrhea and constipation.  Genitourinary: Negative for urgency and frequency.  Skin: Negative for pallor or rash   Neurological: Negative for weakness, light-headedness, numbness and headaches.  Hematological: Negative for adenopathy. Does not bruise/bleed easily.  Psychiatric/Behavioral: Negative for dysphoric mood. The patient is not nervous/anxious.         Objective:   Physical Exam  Constitutional: She appears well-developed and well-nourished. No distress.  HENT:  Head: Normocephalic and atraumatic.  Right Ear: External ear normal.  Left Ear: External ear normal.  Mouth/Throat: Oropharynx is clear and moist.  Nares are boggy   Tender over L frontal and  maxillary sinus  No facial rash or skin change noted No TMJ tenderness    Eyes: Conjunctivae and EOM are normal. Pupils are equal, round, and reactive to light. Right eye exhibits no discharge. Left eye exhibits no discharge.  Neck: Normal range of motion. Neck supple.  Cardiovascular: Normal rate, regular rhythm and normal heart sounds.   Pulmonary/Chest: Effort normal and breath sounds normal. No respiratory distress. She has no wheezes. She has no rales.  Lymphadenopathy:    She has no cervical adenopathy.  Neurological: She is alert. She has normal reflexes. No cranial nerve deficit or sensory deficit. Coordination normal.  Skin: Skin is warm and dry. No rash noted. No erythema.  Psychiatric: She has a normal mood and affect.          Assessment & Plan:   Problem List Items Addressed This Visit      Respiratory   Acute sinusitis    L frontal and maxillary pain and pressure (also ear) in pt with hx of rhinitis  Also low grade temp  Not a classic case -no nasal d/c  Disc symptomatic care - see instructions on AVS  Px augmentin bid 7 d If worse or no imp - update  Also watch closely for rash (disc remote poss that shingles can also cause facial pain before rash)       Relevant Medications   amoxicillin-clavulanate (AUGMENTIN) 875-125 MG tablet

## 2016-06-25 NOTE — Progress Notes (Signed)
Pre visit review using our clinic review tool, if applicable. No additional management support is needed unless otherwise documented below in the visit note. 

## 2016-06-25 NOTE — Assessment & Plan Note (Signed)
L frontal and maxillary pain and pressure (also ear) in pt with hx of rhinitis  Also low grade temp  Not a classic case -no nasal d/c  Disc symptomatic care - see instructions on AVS  Px augmentin bid 7 d If worse or no imp - update  Also watch closely for rash (disc remote poss that shingles can also cause facial pain before rash)

## 2016-06-25 NOTE — Patient Instructions (Signed)
Take the augmentin for suspected sinusitis  Drink fluids Breathe steam and use a vaporizer  Nasal saline spray  Continue flonase   (allegra only if runny nose)  Ibuprofen will help and pressure- take with food Try warm compresses on your face   Update if not starting to improve in a week or if worsening   Watch closely for a rash (ie: shingles)

## 2016-06-26 LAB — CYTOLOGY - PAP

## 2016-06-27 ENCOUNTER — Telehealth: Payer: Self-pay

## 2016-06-27 DIAGNOSIS — B029 Zoster without complications: Secondary | ICD-10-CM | POA: Insufficient documentation

## 2016-06-27 MED ORDER — VALACYCLOVIR HCL 1 G PO TABS: 1000.0000 mg | ORAL_TABLET | Freq: Three times a day (TID) | ORAL | 0 refills | Status: DC

## 2016-06-27 MED ORDER — TRAMADOL HCL 50 MG PO TABS: 50.0000 mg | ORAL_TABLET | Freq: Three times a day (TID) | ORAL | 0 refills | Status: DC | PRN

## 2016-06-27 NOTE — Telephone Encounter (Signed)
inst patient to stop abx  Suspect shingles /early - with rash in mouth  No ocular involvement currently  Sent valtrex 1 g tid for 7 days Has magic mouthwash from dentist   Tramadol as needed with caution of sedation   Alert us if rash around the eye   Spoke to pt about this as she stopped by the office and tramdol printed/ valtrex sent to the pharmacy

## 2016-06-27 NOTE — Telephone Encounter (Signed)
Pt walked in; pt had been at dentist and was advised to see PCP due to rash in mouth that hurts and pt has pain under lt eye but no visible rash.  Pt seen 06/25/16.Dr Milinda Antisower will speak with pt.

## 2016-07-09 ENCOUNTER — Ambulatory Visit: Payer: BLUE CROSS/BLUE SHIELD | Admitting: Endocrinology

## 2016-07-09 ENCOUNTER — Other Ambulatory Visit (INDEPENDENT_AMBULATORY_CARE_PROVIDER_SITE_OTHER): Payer: Managed Care, Other (non HMO)

## 2016-07-09 DIAGNOSIS — E063 Autoimmune thyroiditis: Secondary | ICD-10-CM

## 2016-07-09 DIAGNOSIS — E038 Other specified hypothyroidism: Secondary | ICD-10-CM | POA: Diagnosis not present

## 2016-07-09 LAB — T3, FREE: T3, Free: 2.6 pg/mL (ref 2.3–4.2)

## 2016-07-09 LAB — T4, FREE: FREE T4: 0.66 ng/dL (ref 0.60–1.60)

## 2016-07-09 LAB — TSH: TSH: 2.37 u[IU]/mL (ref 0.35–4.50)

## 2016-07-14 ENCOUNTER — Ambulatory Visit (INDEPENDENT_AMBULATORY_CARE_PROVIDER_SITE_OTHER): Payer: Managed Care, Other (non HMO) | Admitting: Endocrinology

## 2016-07-14 DIAGNOSIS — E038 Other specified hypothyroidism: Secondary | ICD-10-CM | POA: Diagnosis not present

## 2016-07-14 DIAGNOSIS — E063 Autoimmune thyroiditis: Secondary | ICD-10-CM

## 2016-07-14 MED ORDER — THYROID 60 MG PO TABS: ORAL_TABLET | ORAL | 5 refills | Status: DC

## 2016-07-14 NOTE — Progress Notes (Signed)
Patient ID: Kelly Tran, female   DOB: [birth year], 51 y.o.   MRN: [id number]   Reason for Appointment:  Hypothyroidism, followup visit   History of Present Illness:   The hypothyroidism was first diagnosed in 11/2011 Initially her hypothyroidism was diagnosed with symptoms of hair loss for several years and also fatigue and cold intolerance Notably her TSH at baseline was only 4.7 On her initial consultation she had been still having the symptoms above with taking 50 mcg of levothyroxine  With changing to Armour Thyroid 45 mg daily in 07/2012 her symptoms of fatigue and cold intolerance were improved However on her follow-up visit in 12/16 her TSH was 5.9 She was having symptoms of fatigue as usual along with some chronic hair loss  Since increasing her Armour Thyroid to 60 mg daily in 12/16 she was complaining about palpitations at times including at night. However this resolved with her stopping amitriptyline as directed in 2/17  RECENT history: Currently she is on 60 mg Armour Thyroid daily His not having any unusual fatigue, does have some continued fatigue which she thinks is from stress Is sleeping better Not exercising regularly  Has mild and stable hair loss She does have some chronic cold sensitivity  Compliance with the medical regimen has been as prescribed with taking the tablet in the morning before breakfast. Not taking any calcium or iron-containing vitamins  Her TSH is stable in the normal range, free T3 is relatively lower and free T4 is about the same and normal Her weight has been stable  Wt Readings from Last 3 Encounters:  06/25/16 199 lb 8 oz (90.5 kg)  06/05/16 198 lb (89.8 kg)  02/07/16 198 lb (89.8 kg)    Lab Results  Component Value Date   TSH 2.37 07/09/2016   TSH 3.28 12/28/2015   TSH 3.57 11/20/2015   FREET4 0.66 07/09/2016   FREET4 0.67 12/28/2015   FREET4 0.56 (L) 11/20/2015    Lab Results  Component Value Date   T3FREE  2.6 07/09/2016   T3FREE 4.0 12/28/2015   T3FREE 3.0 08/06/2015       Allergies as of 07/14/2016   No Known Allergies     Medication List       Accurate as of 07/14/16  8:12 AM. Always use your most recent med list.          amoxicillin-clavulanate 875-125 MG tablet Commonly known as:  AUGMENTIN Take 1 tablet by mouth 2 (two) times daily.   ARMOUR THYROID 60 MG tablet Generic drug:  thyroid TAKE 1 TABLET BY MOUTH DAILY ON AN EMPTY STOMACH   aspirin 81 MG tablet Take 81 mg by mouth daily.   b complex vitamins tablet Take 1 tablet by mouth daily.   CALCIUM 500 + D PO Take 1 tablet by mouth daily. Reported on 09/10/2015   cyclobenzaprine 5 MG tablet Commonly known as:  FLEXERIL Take 1 tablet (5 mg total) by mouth 3 (three) times daily as needed for muscle spasms.   fluticasone 50 MCG/ACT nasal spray Commonly known as:  FLONASE Place 2 sprays into both nostrils daily.   traMADol 50 MG tablet Commonly known as:  ULTRAM Take 1 tablet (50 mg total) by mouth every 8 (eight) hours as needed. For pain   valACYclovir 1000 MG tablet Commonly known as:  VALTREX Take 1 tablet (1,000 mg total) by mouth 3 (three) times daily.       Past Medical History:  Diagnosis Date  .  Allergy   . G E R D 02/22/2007  . Genital warts   . Hemorrhoids, internal 01/31/2011  . Migraine     Past Surgical History:  Procedure Laterality Date  . APPENDECTOMY    . DILATION AND CURETTAGE OF UTERUS    . miscarriage     x3--required d and c    Family History  Problem Relation Age of Onset  . Stroke Mother   . Arthritis Mother   . Hypertension Father   . Arthritis Father   . Hyperlipidemia Father   . Cancer Maternal Aunt     ovarian  . Arthritis Maternal Grandmother   . Arthritis Maternal Grandfather   . Arthritis Paternal Grandmother   . Arthritis Paternal Grandfather   . Cancer Maternal Aunt     Breast  . Cancer Maternal Aunt     Lung  . Diabetes Neg Hx     family    Social  History:  reports that she quit smoking about 20 years ago. She has never used smokeless tobacco. She reports that she drinks alcohol. She reports that she does not use drugs.  Allergies: No Known Allergies  ROS   No history of hypertension: Blood pressure readings as follows  BP Readings from Last 3 Encounters:  06/25/16 122/74  06/05/16 120/60  02/07/16 118/80   Her gynecologist does not want to have HRT because of family history of cancer    Examination:   LMP 03/11/2013   She looks well.      Thyroid not palpable        Skin appears normal    Assessment/ Plan:  Hypothyroidism, controlled with taking Armour Thyroid 60 mg daily She has been on a stable dose since 12/16 Subjectively doing fairly well now, no change in her mild continued tiredness as before  Her free T3 appears to be fluctuating but her TSH is relatively better This may be related to inconsistency in the formulation of Armour Thyroid  Continue the same regimen Follow-up in 6 months again  [facility],Brigida Scotti 07/14/2016, 8:12 AM

## 2016-07-14 NOTE — Patient Instructions (Signed)
No change in Rx 

## 2016-07-15 ENCOUNTER — Encounter: Payer: Self-pay | Admitting: Internal Medicine

## 2016-07-15 ENCOUNTER — Ambulatory Visit (INDEPENDENT_AMBULATORY_CARE_PROVIDER_SITE_OTHER): Payer: Managed Care, Other (non HMO) | Admitting: Internal Medicine

## 2016-07-15 VITALS — BP 116/74 | HR 59 | Temp 98.0°F | Wt 196.0 lb

## 2016-07-15 DIAGNOSIS — B028 Zoster with other complications: Secondary | ICD-10-CM

## 2016-07-15 DIAGNOSIS — R5383 Other fatigue: Secondary | ICD-10-CM

## 2016-07-15 DIAGNOSIS — H6982 Other specified disorders of Eustachian tube, left ear: Secondary | ICD-10-CM | POA: Diagnosis not present

## 2016-07-15 DIAGNOSIS — F439 Reaction to severe stress, unspecified: Secondary | ICD-10-CM | POA: Diagnosis not present

## 2016-07-15 LAB — VITAMIN D 25 HYDROXY (VIT D DEFICIENCY, FRACTURES): VITD: 25.22 ng/mL — ABNORMAL LOW (ref 30.00–100.00)

## 2016-07-15 LAB — VITAMIN B12: Vitamin B-12: 392 pg/mL (ref 211–911)

## 2016-07-15 MED ORDER — PREDNISONE 10 MG PO TABS: ORAL_TABLET | ORAL | 0 refills | Status: DC

## 2016-07-15 NOTE — Progress Notes (Signed)
Subjective:    Patient ID: Kelly RocheMelinda M Brindisi, female    DOB: 09/10/1965, 51 y.o.   MRN: [id number]  HPI  Pt presents to the clinic today to follow up shingles. She was seen 06/25/16, with c/o left ear pain and facial pain. She was diagnosed with an atypical sinus infection and treated with Augmentin. 2 days later, she developed a painful rash in the mouth. She stopped by the office to talk with Dr. Milinda Antisower. She felt like this was not really sinusitis, but rather an early case of shingles. Her Augmentin was stopped and she was treated with Valtrex, Tramadol and Magic Mouthwash. She reports the rash has improved, but she continues to have tingling on the left side of her face. She reports it is not bad enough that she can not deal with it, but she is concerned about it and wants to make sure everything is okay.  She also c/o left ear fullness. This has been intermittent over the last month and does not think it is related to shingles, because she was having this issue prior. She denies pain or decreased hearing. She denies runny nose, nasal congestion, or sore throat. She has tried Engineer, agriculturaludafed and Flonase with minimal relief. She has seen ENT in the past who advised her she may need a T-tube. She is concerned because she leaves for a cruise on Friday and she is supposed to be spending 2 days scuba diving while she is gone.  She also reports ongoing fatigue. This is not a new issue for her. She feels like she sleeps well, but just doesn't want to get out of bed in the morning. She does not nap during the day. She does not feel down or depressed. She is under a lot of stress at work and reports this may be a contributing factor. She does have a history of hypothyroidism, but labs were drawn yesterday, and were normal. She has a history of Vit D deficiency, but is taking 2000 units of Vit D daily.  Review of Systems      Past Medical History:  Diagnosis Date  . Allergy   . G E R D 02/22/2007  . Genital  warts   . Hemorrhoids, internal 01/31/2011  . Migraine     Current Outpatient Prescriptions  Medication Sig Dispense Refill  . amoxicillin-clavulanate (AUGMENTIN) 875-125 MG tablet Take 1 tablet by mouth 2 (two) times daily. 14 tablet 0  . aspirin 81 MG tablet Take 81 mg by mouth daily.    Marland Kitchen. b complex vitamins tablet Take 1 tablet by mouth daily.    . Calcium Carbonate-Vitamin D (CALCIUM 500 + D PO) Take 1 tablet by mouth daily. Reported on 09/10/2015    . cyclobenzaprine (FLEXERIL) 5 MG tablet Take 1 tablet (5 mg total) by mouth 3 (three) times daily as needed for muscle spasms. 20 tablet 0  . fluticasone (FLONASE) 50 MCG/ACT nasal spray Place 2 sprays into both nostrils daily. 16 g 0  . thyroid (ARMOUR THYROID) 60 MG tablet TAKE 1 TABLET BY MOUTH DAILY ON AN EMPTY STOMACH 30 tablet 5   No current facility-administered medications for this visit.     No Known Allergies  Family History  Problem Relation Age of Onset  . Stroke Mother   . Arthritis Mother   . Hypertension Father   . Arthritis Father   . Hyperlipidemia Father   . Cancer Maternal Aunt     ovarian  . Arthritis Maternal Grandmother   .  Arthritis Maternal Grandfather   . Arthritis Paternal Grandmother   . Arthritis Paternal Grandfather   . Cancer Maternal Aunt     Breast  . Cancer Maternal Aunt     Lung  . Diabetes Neg Hx     family    Social History   Social History  . Marital status: Married    Spouse name: N/A  . Number of children: N/A  . Years of education: N/A   Occupational History  . Not on file.   Social History Main Topics  . Smoking status: Former Smoker    Quit date: 01/31/1996  . Smokeless tobacco: Never Used  . Alcohol use 0.0 oz/week     Comment: occ  . Drug use: No  . Sexual activity: Yes   Other Topics Concern  . Not on file   Social History Narrative  . No narrative on file     Constitutional: Pt reports fatigue. Denies fever, malaise, headache or abrupt weight changes.    HEENT: Pt reports left ear fullness. Denies eye pain, eye redness, ear pain, ringing in the ears, wax buildup, runny nose, nasal congestion, bloody nose, or sore throat. Skin: Denies redness, rashes, lesions or ulcercations.  Neurological: Pt reports tingling sensation of left side of face. Denies dizziness, difficulty with memory, difficulty with speech or problems with balance and coordination.  Psych: Pt reports stress. Denies anxiety, depression, SI/HI.  No other specific complaints in a complete review of systems (except as listed in HPI above).  Objective:   Physical Exam   BP 116/74   Pulse (!) 59   Temp 98 F (36.7 C) (Oral)   Wt 196 lb (88.9 kg)   LMP 03/11/2013   SpO2 98%   BMI 29.37 kg/m  Wt Readings from Last 3 Encounters:  07/15/16 196 lb (88.9 kg)  06/25/16 199 lb 8 oz (90.5 kg)  06/05/16 198 lb (89.8 kg)    General: Appears her stated age, well developed, well nourished in NAD. Skin: Warm, dry and intact. No rashes noted HEENT: Head: normal shape and size, no sinus tenderness noted; Ears: Tm's gray, retracted but intact, normal light reflex, +effusion on the left; Throat/Mouth: Teeth present, mucosa pink and moist, no exudate, lesions or ulcerations noted.  Cardiovascular: Normal rate and rhythm.  Pulmonary/Chest: Normal effort and positive vesicular breath sounds. No respiratory distress. No wheezes, rales or ronchi noted.  Neurological: Alert and oriented. Sensation equal on both sides of her face.  Psychiatric: Mood and affect normal. Behavior is normal. Judgment and thought content normal.     BMET    Component Value Date/Time   NA 140 09/10/2015 1444   K 3.8 09/10/2015 1444   CL 102 09/10/2015 1444   CO2 31 09/10/2015 1444   GLUCOSE 93 09/10/2015 1444   BUN 14 09/10/2015 1444   CREATININE 0.72 09/10/2015 1444   CALCIUM 9.5 09/10/2015 1444   GFRNONAA 114.69 04/04/2009 0752   GFRAA 117 02/16/2007 1040    Lipid Panel     Component Value  Date/Time   CHOL 160 11/20/2015 1459   TRIG 89.0 11/20/2015 1459   HDL 76.60 11/20/2015 1459   CHOLHDL 2 11/20/2015 1459   VLDL 17.8 11/20/2015 1459   LDLCALC 66 11/20/2015 1459    CBC    Component Value Date/Time   WBC 8.9 09/10/2015 1444   RBC 4.43 09/10/2015 1444   HGB 13.3 09/10/2015 1444   HCT 39.6 09/10/2015 1444   PLT 278.0 09/10/2015 1444  MCV 89.4 09/10/2015 1444   MCHC 33.6 09/10/2015 1444   RDW 12.9 09/10/2015 1444   LYMPHSABS 3.2 05/03/2013 1127   MONOABS 0.7 05/03/2013 1127   EOSABS 0.2 05/03/2013 1127   BASOSABS 0.0 05/03/2013 1127    Hgb A1C No results found for: HGBA1C         Assessment & Plan:   Fatigue:  Could be related to stress or vitamin deficiency Discussed stress management techniques Will check Vit D and B12 today  ETD, left:  Ongoing issue Failed Flonase and decongestant eRx for Pred Taper x 6 days If no improvement, will refer back to ENT for further evaluation and treatment  Shingles:  Appears to have resolved Some lingering tingling If does not improve with Prednisone, consider trial of Neurontin Will monitor for now  RTC as needed or if symptoms persist or worsen Ottilie Wigglesworth, NP

## 2016-07-15 NOTE — Patient Instructions (Signed)

## 2016-07-28 ENCOUNTER — Encounter: Payer: Self-pay | Admitting: Internal Medicine

## 2016-07-28 ENCOUNTER — Ambulatory Visit (INDEPENDENT_AMBULATORY_CARE_PROVIDER_SITE_OTHER): Payer: Managed Care, Other (non HMO) | Admitting: Internal Medicine

## 2016-07-28 VITALS — BP 122/74 | HR 83 | Temp 97.8°F | Wt 200.0 lb

## 2016-07-28 DIAGNOSIS — L249 Irritant contact dermatitis, unspecified cause: Secondary | ICD-10-CM

## 2016-07-28 DIAGNOSIS — H6982 Other specified disorders of Eustachian tube, left ear: Secondary | ICD-10-CM | POA: Diagnosis not present

## 2016-07-28 MED ORDER — TRIAMCINOLONE ACETONIDE 0.5 % EX OINT: 1.0000 "application " | TOPICAL_OINTMENT | Freq: Two times a day (BID) | CUTANEOUS | 0 refills | Status: DC

## 2016-07-28 NOTE — Progress Notes (Signed)
Subjective:    Patient ID: Kelly Tran, female    DOB: 01/29/1966, 51 y.o.   MRN: [id number]  HPI  Pt presents to the clinic today with c/o ongoing left ear pressure. This started over a month ago. She denies ear pain or decreased hearing. She has failed Prednisone and Flonase. She is requesting referral to ENT at this time.   She also c/o blisters on her left hand. She reports she was scuba diving while on a cruise 1 week ago. During the expedition, she cut her hand. 2 days later, she noticed blisters on the same hand. The blisters are painful. It has not spread. She denies blisters in her feet or mouth. She has tried oral and topical Benadryl with minimal relief.  Review of Systems      Past Medical History:  Diagnosis Date  . Allergy   . G E R D 02/22/2007  . Genital warts   . Hemorrhoids, internal 01/31/2011  . Migraine     Current Outpatient Prescriptions  Medication Sig Dispense Refill  . aspirin 81 MG tablet Take 81 mg by mouth daily.    Marland Kitchen. b complex vitamins tablet Take 1 tablet by mouth daily.    . Calcium Carbonate-Vitamin D (CALCIUM 500 + D PO) Take 1 tablet by mouth daily. Reported on 09/10/2015    . cyclobenzaprine (FLEXERIL) 5 MG tablet Take 1 tablet (5 mg total) by mouth 3 (three) times daily as needed for muscle spasms. 20 tablet 0  . fluticasone (FLONASE) 50 MCG/ACT nasal spray Place 2 sprays into both nostrils daily. 16 g 0  . thyroid (ARMOUR THYROID) 60 MG tablet TAKE 1 TABLET BY MOUTH DAILY ON AN EMPTY STOMACH 30 tablet 5   No current facility-administered medications for this visit.     No Known Allergies  Family History  Problem Relation Age of Onset  . Stroke Mother   . Arthritis Mother   . Hypertension Father   . Arthritis Father   . Hyperlipidemia Father   . Cancer Maternal Aunt     ovarian  . Arthritis Maternal Grandmother   . Arthritis Maternal Grandfather   . Arthritis Paternal Grandmother   . Arthritis Paternal Grandfather   . Cancer  Maternal Aunt     Breast  . Cancer Maternal Aunt     Lung  . Diabetes Neg Hx     family    Social History   Social History  . Marital status: Married    Spouse name: N/A  . Number of children: N/A  . Years of education: N/A   Occupational History  . Not on file.   Social History Main Topics  . Smoking status: Former Smoker    Quit date: 01/31/1996  . Smokeless tobacco: Never Used  . Alcohol use 0.0 oz/week     Comment: occ  . Drug use: No  . Sexual activity: Yes   Other Topics Concern  . Not on file   Social History Narrative  . No narrative on file     Constitutional: Denies fever, malaise, fatigue, headache or abrupt weight changes.  HEENT: Pt reports left ear fullness. Denies eye pain, eye redness, ear pain, ringing in the ears, wax buildup, runny nose, nasal congestion, bloody nose, or sore throat. Skin: Pt reports blisters on left hand. Denies redness or ulcercations.    No other specific complaints in a complete review of systems (except as listed in HPI above).  Objective:   Physical Exam  BP 122/74   Pulse 83   Temp 97.8 F (36.6 C) (Oral)   Wt 200 lb (90.7 kg)   LMP 03/11/2013   SpO2 98%   BMI 29.97 kg/m  Wt Readings from Last 3 Encounters:  07/28/16 200 lb (90.7 kg)  07/15/16 196 lb (88.9 kg)  06/25/16 199 lb 8 oz (90.5 kg)    General: Appears her stated age, well developed, well nourished in NAD. Skin: Scattered papular rash noted on left medial wrist, 2nd, 3rd and 4th fingers. HEENT: Ears: Tm's gray and intact, normal light reflex, + serous effusion on the left.   BMET    Component Value Date/Time   NA 140 09/10/2015 1444   K 3.8 09/10/2015 1444   CL 102 09/10/2015 1444   CO2 31 09/10/2015 1444   GLUCOSE 93 09/10/2015 1444   BUN 14 09/10/2015 1444   CREATININE 0.72 09/10/2015 1444   CALCIUM 9.5 09/10/2015 1444   GFRNONAA 114.69 04/04/2009 0752   GFRAA 117 02/16/2007 1040    Lipid Panel     Component Value Date/Time   CHOL  160 11/20/2015 1459   TRIG 89.0 11/20/2015 1459   HDL 76.60 11/20/2015 1459   CHOLHDL 2 11/20/2015 1459   VLDL 17.8 11/20/2015 1459   LDLCALC 66 11/20/2015 1459    CBC    Component Value Date/Time   WBC 8.9 09/10/2015 1444   RBC 4.43 09/10/2015 1444   HGB 13.3 09/10/2015 1444   HCT 39.6 09/10/2015 1444   PLT 278.0 09/10/2015 1444   MCV 89.4 09/10/2015 1444   MCHC 33.6 09/10/2015 1444   RDW 12.9 09/10/2015 1444   LYMPHSABS 3.2 05/03/2013 1127   MONOABS 0.7 05/03/2013 1127   EOSABS 0.2 05/03/2013 1127   BASOSABS 0.0 05/03/2013 1127    Hgb A1C No results found for: HGBA1C          Assessment & Plan:   ETD left:  Failed conventional therapy Referral to ENT placed- see Shirlee Limerick on the way out to schedule  Contact Dermatitis:  eRx for Kenalog cream to affected area  Return precautions discussed Nicki Reaper, NP

## 2016-07-28 NOTE — Patient Instructions (Signed)

## 2016-08-05 ENCOUNTER — Ambulatory Visit (INDEPENDENT_AMBULATORY_CARE_PROVIDER_SITE_OTHER): Payer: Managed Care, Other (non HMO) | Admitting: Family Medicine

## 2016-08-05 ENCOUNTER — Ambulatory Visit: Payer: Managed Care, Other (non HMO) | Admitting: Internal Medicine

## 2016-08-05 ENCOUNTER — Encounter: Payer: Self-pay | Admitting: Family Medicine

## 2016-08-05 DIAGNOSIS — J069 Acute upper respiratory infection, unspecified: Secondary | ICD-10-CM | POA: Insufficient documentation

## 2016-08-05 MED ORDER — BENZONATATE 200 MG PO CAPS: 200.0000 mg | ORAL_CAPSULE | Freq: Three times a day (TID) | ORAL | 0 refills | Status: DC | PRN

## 2016-08-05 NOTE — Progress Notes (Signed)
Had gone [place]  Husband got sick after the trip, then the patient got sick 7 days ago.  First noted cough, then fatigue.  Today with head congestion.  No fevers now but initially had temp up to 99.8.  No vomiting, no diarrhea.  No ear pain but ears feel stuffy.  No facial pain.  Some scant sputum.  R side of chest is sore, likely from coughing.    Husband is some better but still coughing some.    Rash on L wrist after going diving- after likely touching fire coral.  No sx now other than visible rash.    Meds, vitals, and allergies reviewed.   ROS: Per HPI unless specifically indicated in ROS section   GEN: nad, alert and oriented HEENT: mucous membranes moist, tm w/o erythema, nasal exam w/o erythema, clear discharge noted,  OP with cobblestoning NECK: supple w/o LA CV: rrr.   PULM: ctab, no inc wob EXT: no edema SKIN: no acute rash except for reddish maculopapular linear rash on the L wrist.

## 2016-08-05 NOTE — Patient Instructions (Signed)
Use tessalon for cough, change flonase to twice a day for a few days.  Rest and fluids.  Update us as needed.  Take care.  Glad to see you.

## 2016-08-05 NOTE — Assessment & Plan Note (Signed)
Ctab.  Sinuses not ttp.  D/w pt.  See AVS.  Okay to use BID flonase for a few days and prn tessalon.  D/w pt.  She agrees.  Likely viral.  No need for abx.

## 2016-08-05 NOTE — Progress Notes (Signed)
Pre visit review using our clinic review tool, if applicable. No additional management support is needed unless otherwise documented below in the visit note. 

## 2016-08-14 ENCOUNTER — Other Ambulatory Visit: Payer: Self-pay | Admitting: Internal Medicine

## 2016-08-14 DIAGNOSIS — J302 Other seasonal allergic rhinitis: Secondary | ICD-10-CM

## 2016-08-19 ENCOUNTER — Telehealth: Payer: Self-pay

## 2016-08-19 DIAGNOSIS — M26629 Arthralgia of temporomandibular joint, unspecified side: Secondary | ICD-10-CM | POA: Insufficient documentation

## 2016-08-19 NOTE — Telephone Encounter (Signed)
Pt left v/m; pt was referred to ENT by Pamala Hurry Baity NP and pt request cb. I spoke with pt and she wants to talk with Pamala Hurry Baity NP about the comments pt was given by ENT doctor. That is all the info pt wants to give me and request cb from Pamala Hurry Baity NP.

## 2016-08-20 NOTE — Telephone Encounter (Signed)
I saw the comments. He did not feel like she had ETC but TMJ. She can either email me her questions through mychart or will need to make an appt to discuss

## 2016-08-21 NOTE — Telephone Encounter (Signed)
Left detailed msg on VM per HIPAA  

## 2016-12-03 ENCOUNTER — Ambulatory Visit (INDEPENDENT_AMBULATORY_CARE_PROVIDER_SITE_OTHER): Payer: 59 | Admitting: Internal Medicine

## 2016-12-03 ENCOUNTER — Encounter: Payer: Self-pay | Admitting: Internal Medicine

## 2016-12-03 VITALS — BP 118/70 | HR 74 | Temp 98.1°F | Wt 199.0 lb

## 2016-12-03 DIAGNOSIS — B070 Plantar wart: Secondary | ICD-10-CM

## 2016-12-03 NOTE — Patient Instructions (Signed)
Plantar Warts    Plantar warts are small growths on the bottom of the foot (sole). Warts are caused by a type of germ (virus). Most warts are not painful, and they usually do not cause problems. Sometimes, plantar warts can cause pain when you walk. Warts often go away on their own in time. Treatments may be done if needed.  Follow these instructions at home:  General instructions   · Apply creams or solutions only as told by your doctor. Follow these steps if your doctor tells you to do so:  ? Soak your foot in warm water.  ? Remove the top layer of softened skin before you apply the medicine. You can use a pumice stone to remove the tissue.  ? After you apply the medicine, put a bandage over the area of the wart.  ? Repeat the process every day or as told by your doctor.  · Do not scratch or pick at a wart.  · Wash your hands after you touch a wart.  · If a wart is painful, try putting a bandage with a hole in the middle over the wart.  · Keep all follow-up visits as told by your doctor. This is important.  Prevention   · Wear shoes and socks. Change socks every day.  · Keep your feet clean and dry.  · Check your feet often.  · Avoid direct contact with warts on other people.  Contact a doctor if:  · Your warts do not improve after treatment.  · You have redness, swelling, or pain at the site of a wart.  · You have bleeding from a wart, and the bleeding does not stop when you put light pressure on the wart.  · You have diabetes and you get a wart.  This information is not intended to replace advice given to you by your health care provider. Make sure you discuss any questions you have with your health care provider.  Document Released: 07/26/2010 Document Revised: 11/29/2015 Document Reviewed: 09/18/2014  Elsevier Interactive Patient Education © 2017 [employer]   

## 2016-12-03 NOTE — Progress Notes (Signed)
Subjective:    Patient ID: Kelly Tran, female    DOB: [birth year], 51 y.o.   MRN: [id number]  HPI  Pt presents to the clinic today with c/o a sore on the bottom of her right foot. She report she got a pedicure Friday evening. When she woke up Saturday, she noticed some swelling and pain of her right foot and look down and saw that she had a sore there. She has not noticed any redness, warmth, drainage or bleeding from the area. She has no history of diabetes. She has not tried anything OTC for this.  Review of Systems  Past Medical History:  Diagnosis Date  . Allergy   . G E R D 02/22/2007  . Genital warts   . Hemorrhoids, internal 01/31/2011  . Migraine     Current Outpatient Prescriptions  Medication Sig Dispense Refill  . aspirin 81 MG tablet Take 81 mg by mouth daily.    Marland Kitchen b complex vitamins tablet Take 1 tablet by mouth daily.    . benzonatate (TESSALON) 200 MG capsule Take 1 capsule (200 mg total) by mouth 3 (three) times daily as needed for cough. 30 capsule 0  . Calcium Carbonate-Vitamin D (CALCIUM 500 + D PO) Take 1 tablet by mouth daily. Reported on 09/10/2015    . cholecalciferol (VITAMIN D) 1000 units tablet Take 5,000 Units by mouth.    . cyclobenzaprine (FLEXERIL) 5 MG tablet Take 1 tablet (5 mg total) by mouth 3 (three) times daily as needed for muscle spasms. 20 tablet 0  . fluticasone (FLONASE) 50 MCG/ACT nasal spray Place 2 sprays into both nostrils daily. 16 g 0  . fluticasone (FLONASE) 50 MCG/ACT nasal spray USE 2 SPRAYS INTO EACH NOSTRIL ONCE DAILY AS DIRECTED. 16 g 2  . thyroid (ARMOUR THYROID) 60 MG tablet TAKE 1 TABLET BY MOUTH DAILY ON AN EMPTY STOMACH 30 tablet 5  . triamcinolone ointment (KENALOG) 0.5 % Apply 1 application topically 2 (two) times daily. 30 g 0   No current facility-administered medications for this visit.     No Known Allergies  Family History  Problem Relation Age of Onset  . Stroke Mother   . Arthritis Mother   . Hypertension  Father   . Arthritis Father   . Hyperlipidemia Father   . Cancer Maternal Aunt        ovarian  . Arthritis Maternal Grandmother   . Arthritis Maternal Grandfather   . Arthritis Paternal Grandmother   . Arthritis Paternal Grandfather   . Cancer Maternal Aunt        Breast  . Cancer Maternal Aunt        Lung  . Diabetes Neg Hx        family    Social History   Social History  . Marital status: Married    Spouse name: N/A  . Number of children: N/A  . Years of education: N/A   Occupational History  . Not on file.   Social History Main Topics  . Smoking status: Former Smoker    Quit date: 01/31/1996  . Smokeless tobacco: Never Used  . Alcohol use 0.0 oz/week     Comment: occ  . Drug use: No  . Sexual activity: Yes   Other Topics Concern  . Not on file   Social History Narrative  . No narrative on file     Constitutional: Denies fever, malaise, fatigue, headache or abrupt weight changes.  Skin: Pt reports a sore  on the bottom of her right foot. Denies redness, rashes, or ulcercations.    No other specific complaints in a complete review of systems (except as listed in HPI above).     Objective:   Physical Exam    BP 118/70   Pulse 74   Temp 98.1 F (36.7 C) (Oral)   Wt 199 lb (90.3 kg)   LMP 03/11/2013   SpO2 98%   BMI 29.82 kg/m  Wt Readings from Last 3 Encounters:  12/03/16 199 lb (90.3 kg)  08/05/16 193 lb (87.5 kg)  07/28/16 200 lb (90.7 kg)    General: Appears her stated age, well developed, well nourished in NAD. Skin: Plantars wart noted of bottom of right foot.   BMET    Component Value Date/Time   NA 140 09/10/2015 1444   K 3.8 09/10/2015 1444   CL 102 09/10/2015 1444   CO2 31 09/10/2015 1444   GLUCOSE 93 09/10/2015 1444   BUN 14 09/10/2015 1444   CREATININE 0.72 09/10/2015 1444   CALCIUM 9.5 09/10/2015 1444   GFRNONAA 114.69 04/04/2009 0752   GFRAA 117 02/16/2007 1040    Lipid Panel     Component Value Date/Time   CHOL  160 11/20/2015 1459   TRIG 89.0 11/20/2015 1459   HDL 76.60 11/20/2015 1459   CHOLHDL 2 11/20/2015 1459   VLDL 17.8 11/20/2015 1459   LDLCALC 66 11/20/2015 1459    CBC    Component Value Date/Time   WBC 8.9 09/10/2015 1444   RBC 4.43 09/10/2015 1444   HGB 13.3 09/10/2015 1444   HCT 39.6 09/10/2015 1444   PLT 278.0 09/10/2015 1444   MCV 89.4 09/10/2015 1444   MCHC 33.6 09/10/2015 1444   RDW 12.9 09/10/2015 1444   LYMPHSABS 3.2 05/03/2013 1127   MONOABS 0.7 05/03/2013 1127   EOSABS 0.2 05/03/2013 1127   BASOSABS 0.0 05/03/2013 1127    Hgb A1C No results found for: HGBA1C        Assessment & Plan:   Plantar Wart, Right Foot:  Advised her to try Compound W Freeze Off Wear toe pad for comfort If persist, make an appt with [facility]  RTC as needed or if symptoms persist or worsen Nicki ReaperBAITY, REGINA, NP

## 2017-01-05 ENCOUNTER — Telehealth: Payer: Self-pay | Admitting: Endocrinology

## 2017-01-05 ENCOUNTER — Other Ambulatory Visit: Payer: Self-pay

## 2017-01-05 MED ORDER — THYROID 60 MG PO TABS: ORAL_TABLET | ORAL | 5 refills | Status: DC

## 2017-01-05 NOTE — Telephone Encounter (Signed)
**  Remind patient they can make refill requests via MyChart**  Medication refill request (Name & Dosage):  thyroid (ARMOUR THYROID) 60 MG tablet  Preferred pharmacy (Name & Address):  [facility] - [place], [place] - [id number] [phone] (Phone) [phone] (Fax)    Other comments (if applicable):  Needs a refill on the medication above. Appointment is not until August and will be out before then. Call patient to advise.

## 2017-01-05 NOTE — Telephone Encounter (Signed)
Called patient and left a voice message to let her know that her refill has been submitted to the [facility].

## 2017-01-09 ENCOUNTER — Other Ambulatory Visit: Payer: Managed Care, Other (non HMO)

## 2017-01-12 ENCOUNTER — Ambulatory Visit: Payer: Managed Care, Other (non HMO) | Admitting: Endocrinology

## 2017-02-05 ENCOUNTER — Other Ambulatory Visit (INDEPENDENT_AMBULATORY_CARE_PROVIDER_SITE_OTHER): Payer: 59

## 2017-02-05 DIAGNOSIS — E063 Autoimmune thyroiditis: Secondary | ICD-10-CM

## 2017-02-05 LAB — TSH: TSH: 7.04 u[IU]/mL — AB (ref 0.35–4.50)

## 2017-02-05 LAB — T4, FREE: FREE T4: 0.58 ng/dL — AB (ref 0.60–1.60)

## 2017-02-05 LAB — T3, FREE: T3 FREE: 2.3 pg/mL (ref 2.3–4.2)

## 2017-02-10 ENCOUNTER — Ambulatory Visit (INDEPENDENT_AMBULATORY_CARE_PROVIDER_SITE_OTHER): Payer: 59 | Admitting: Endocrinology

## 2017-02-10 ENCOUNTER — Encounter: Payer: Self-pay | Admitting: Endocrinology

## 2017-02-10 VITALS — BP 118/82 | HR 73 | Ht 69.0 in | Wt 198.0 lb

## 2017-02-10 DIAGNOSIS — E039 Hypothyroidism, unspecified: Secondary | ICD-10-CM

## 2017-02-10 MED ORDER — LEVOTHYROXINE SODIUM 50 MCG PO TABS: 50.0000 ug | ORAL_TABLET | Freq: Every day | ORAL | 3 refills | Status: DC

## 2017-02-10 MED ORDER — LIOTHYRONINE SODIUM 5 MCG PO TABS: 10.0000 ug | ORAL_TABLET | Freq: Every day | ORAL | 2 refills | Status: DC

## 2017-02-10 NOTE — Progress Notes (Signed)
Patient ID: Kelly Tran, female   DOB: 1966-07-02, 51 y.o.   MRN: [id number]   Reason for Appointment:  Hypothyroidism, followup visit   History of Present Illness:   The hypothyroidism was first diagnosed in 11/2011 Initially her hypothyroidism was diagnosed with symptoms of hair loss for several years and also fatigue and cold intolerance Notably her TSH at baseline was only 4.7 On her initial consultation she had been still having the symptoms above with taking 50 mcg of levothyroxine  With changing to Armour Thyroid 45 mg daily in 07/2012 her symptoms of fatigue and cold intolerance were improved However on her follow-up visit in 12/16 her TSH was 5.9 She was having symptoms of fatigue as usual along with some chronic hair loss  Since increasing her Armour Thyroid to 60 mg daily in 12/16 she was complaining about palpitations at times including at night. However this resolved with her stopping amitriptyline as directed in 2/17  RECENT history:  Since 06/2015 she is on 60 mg Armour Thyroid daily  On her last visit she was feeling fairly good except for some tiredness related to stress and work issues She is again saying that she is getting tired for the last 4-5 months.  Has had long work hours however. Her fatigue may be more prominent in the last few weeks however She does not complain of any new cold sensitivity or changes in her skin or hair Weight is about the same  Compliance with the medical regimen has been as prescribed with taking the tablet in the morning before breakfast including recently. Not taking any calcium or iron-containing vitamins  Her TSH is now significantly high and her free T4 level is relatively lower T3 is again lower than expected and low normal   Wt Readings from Last 3 Encounters:  02/10/17 198 lb (89.8 kg)  12/03/16 199 lb (90.3 kg)  08/05/16 193 lb (87.5 kg)    Lab Results  Component Value Date   TSH 7.04 (H) 02/05/2017   TSH 2.37 07/09/2016   TSH 3.28 12/28/2015   FREET4 0.58 (L) 02/05/2017   FREET4 0.66 07/09/2016   FREET4 0.67 12/28/2015    Lab Results  Component Value Date   T3FREE 2.3 02/05/2017   T3FREE 2.6 07/09/2016   T3FREE 4.0 12/28/2015       Allergies as of 02/10/2017   No Known Allergies     Medication List       Accurate as of 02/10/17  9:59 AM. Always use your most recent med list.          aspirin 81 MG tablet Take 81 mg by mouth daily.   b complex vitamins tablet Take 1 tablet by mouth daily.   CALCIUM 500 + D PO Take 1 tablet by mouth daily. Reported on 09/10/2015   cholecalciferol 1000 units tablet Commonly known as:  VITAMIN D Take 5,000 Units by mouth.   cyclobenzaprine 5 MG tablet Commonly known as:  FLEXERIL Take 1 tablet (5 mg total) by mouth 3 (three) times daily as needed for muscle spasms.   fluticasone 50 MCG/ACT nasal spray Commonly known as:  FLONASE USE 2 SPRAYS INTO EACH NOSTRIL ONCE DAILY AS DIRECTED.   thyroid 60 MG tablet Commonly known as:  ARMOUR THYROID TAKE 1 TABLET BY MOUTH DAILY ON AN EMPTY STOMACH   triamcinolone ointment 0.5 % Commonly known as:  KENALOG Apply 1 application topically 2 (two) times daily.       Past Medical  History:  Diagnosis Date  . Allergy   . G E R D 02/22/2007  . Genital warts   . Hemorrhoids, internal 01/31/2011  . Migraine     Past Surgical History:  Procedure Laterality Date  . APPENDECTOMY    . DILATION AND CURETTAGE OF UTERUS    . miscarriage     x3--required d and c    Family History  Problem Relation Age of Onset  . Stroke Mother   . Arthritis Mother   . Hypertension Father   . Arthritis Father   . Hyperlipidemia Father   . Cancer Maternal Aunt        ovarian  . Arthritis Maternal Grandmother   . Arthritis Maternal Grandfather   . Arthritis Paternal Grandmother   . Arthritis Paternal Grandfather   . Cancer Maternal Aunt        Breast  . Cancer Maternal Aunt        Lung  .  Diabetes Neg Hx        family    Social History:  reports that she quit smoking about 21 years ago. She has never used smokeless tobacco. She reports that she drinks alcohol. She reports that she does not use drugs.  Allergies: No Known Allergies  ROS   No history of hypertension: Blood pressure readings as follows  BP Readings from Last 3 Encounters:  02/10/17 118/82  12/03/16 118/70  08/05/16 102/62   Her gynecologist does not want to have HRT because of family history of cancer    Examination:   BP 118/82   Pulse 73   Ht 5\' 9"  (1.753 m)   Wt 198 lb (89.8 kg)   LMP 03/11/2013   SpO2 97%   BMI 29.24 kg/m   She looks well.    No puffiness of her face or eyes  Thyroid not palpable        Skin appears normal Biceps reflexes appear normal No peripheral edema     Assessment/ Plan:  Hypothyroidism, Previously controlled with taking Armour Thyroid 60 mg daily since 06/2015  Although she cannot be sure of her fatigue is related to her long work hours she thinks she is feeling worse overall Her baseline TSH was not significantly high but now her TSH is higher than usual at 7.04 despite good compliance with her Armour Thyroid daily She does not have any other typical symptoms  Discussed that since she is having inconsistent levels of T3 and T4 with Armour Thyroid will refer to switch her to levothyroxine and Cytomel separately and adjust the doses based on her response She will also need overall higher dose of supplementation   She will start her  50 g of levothyroxine and 10 g of Cytomel Discussed the differences between various thyroid hormones and the need to take them together before eating in the morning May also consider adding small dose of Cytomel at lunchtime if she has high TSH and tendency to fatigue in the afternoons  Will need follow-up in 6 weeks Total visit time 15 minutes   Raelle Chambers 02/10/2017, 9:59 AM

## 2017-03-25 ENCOUNTER — Other Ambulatory Visit (INDEPENDENT_AMBULATORY_CARE_PROVIDER_SITE_OTHER): Payer: 59

## 2017-03-25 ENCOUNTER — Telehealth: Payer: Self-pay | Admitting: Endocrinology

## 2017-03-25 DIAGNOSIS — E039 Hypothyroidism, unspecified: Secondary | ICD-10-CM | POA: Diagnosis not present

## 2017-03-25 LAB — TSH: TSH: 3.95 u[IU]/mL (ref 0.35–4.50)

## 2017-03-25 LAB — T3, FREE: T3, Free: 3 pg/mL (ref 2.3–4.2)

## 2017-03-25 LAB — T4, FREE: Free T4: 0.71 ng/dL (ref 0.60–1.60)

## 2017-03-25 NOTE — Telephone Encounter (Signed)
She had labs only but needs to see me for OV, please schedule

## 2017-03-26 NOTE — Telephone Encounter (Signed)
This has been scheduled

## 2017-03-31 ENCOUNTER — Encounter: Payer: Self-pay | Admitting: Endocrinology

## 2017-03-31 ENCOUNTER — Ambulatory Visit (INDEPENDENT_AMBULATORY_CARE_PROVIDER_SITE_OTHER): Payer: 59 | Admitting: Endocrinology

## 2017-03-31 VITALS — BP 122/82 | HR 64 | Ht 69.0 in | Wt 199.4 lb

## 2017-03-31 DIAGNOSIS — E063 Autoimmune thyroiditis: Secondary | ICD-10-CM | POA: Diagnosis not present

## 2017-03-31 MED ORDER — LEVOTHYROXINE SODIUM 50 MCG PO TABS: ORAL_TABLET | ORAL | 3 refills | Status: DC

## 2017-03-31 NOTE — Progress Notes (Signed)
Patient ID: Kelly Tran, female   DOB: Apr 12, 1966, 51 y.o.   MRN: [id number]   Reason for Appointment:  Hypothyroidism, followup visit   History of Present Illness:   The hypothyroidism was first diagnosed in 11/2011 Initially her hypothyroidism was diagnosed with symptoms of hair loss for several years and also fatigue and cold intolerance Notably her TSH at baseline was only 4.7 On her initial consultation she had been still having the symptoms above with taking 50 mcg of levothyroxine  With changing to Armour Thyroid 45 mg daily in 07/2012 her symptoms of fatigue and cold intolerance were improved However on her follow-up visit in 12/16 her TSH was 5.9 She was having symptoms of fatigue as usual along with some chronic hair loss  Since increasing her Armour Thyroid to 60 mg daily in 12/16 she was complaining about palpitations at times including at night. However this resolved with her stopping amitriptyline as directed in 2/17  RECENT history:  Since 06/2015 she was on 60 mg Armour Thyroid daily  On her last visit she was feeling more tired than usual, partly related to her long work hours Did not have any weight gain She does not complain of any new cold sensitivity or changes in her skin or hair  Since her TSH was higher than usual at 7 along with relatively low free T4 she was changed from Armour Thyroid to the combination of Synthroid 50 g daily along with Cytomel 10 g daily in the morning  Since then she has had less fatigue although because of her long working hours she does get tired No change in cold sensitivity, no hair or skin changes lately  Compliance with the medical regimen has been as prescribed with taking the tablet in the morning before breakfast including recently. Not taking any calcium or iron-containing vitamins in the morning  Her TSH is upper normal with improved levels of free T3 and T4   Wt Readings from Last 3 Encounters:  03/31/17  199 lb 6.4 oz (90.4 kg)  02/10/17 198 lb (89.8 kg)  12/03/16 199 lb (90.3 kg)    Lab Results  Component Value Date   TSH 3.95 03/25/2017   TSH 7.04 (H) 02/05/2017   TSH 2.37 07/09/2016   FREET4 0.71 03/25/2017   FREET4 0.58 (L) 02/05/2017   FREET4 0.66 07/09/2016    Lab Results  Component Value Date   T3FREE 3.0 03/25/2017   T3FREE 2.3 02/05/2017   T3FREE 2.6 07/09/2016       Allergies as of 03/31/2017   No Known Allergies     Medication List       Accurate as of 03/31/17  8:47 AM. Always use your most recent med list.          aspirin 81 MG tablet Take 81 mg by mouth daily.   b complex vitamins tablet Take 1 tablet by mouth daily.   CALCIUM 500 + D PO Take 1 tablet by mouth daily. Reported on 09/10/2015   cholecalciferol 1000 units tablet Commonly known as:  VITAMIN D Take 5,000 Units by mouth.   cyclobenzaprine 5 MG tablet Commonly known as:  FLEXERIL Take 1 tablet (5 mg total) by mouth 3 (three) times daily as needed for muscle spasms.   fluticasone 50 MCG/ACT nasal spray Commonly known as:  FLONASE USE 2 SPRAYS INTO EACH NOSTRIL ONCE DAILY AS DIRECTED.   levothyroxine 50 MCG tablet Commonly known as:  SYNTHROID, LEVOTHROID Take 1 tablet (50 mcg  total) by mouth daily.   liothyronine 5 MCG tablet Commonly known as:  CYTOMEL Take 2 tablets (10 mcg total) by mouth daily.   triamcinolone ointment 0.5 % Commonly known as:  KENALOG Apply 1 application topically 2 (two) times daily.       Past Medical History:  Diagnosis Date  . Allergy   . G E R D 02/22/2007  . Genital warts   . Hemorrhoids, internal 01/31/2011  . Migraine     Past Surgical History:  Procedure Laterality Date  . APPENDECTOMY    . DILATION AND CURETTAGE OF UTERUS    . miscarriage     x3--required d and c    Family History  Problem Relation Age of Onset  . Stroke Mother   . Arthritis Mother   . Hypertension Father   . Arthritis Father   . Hyperlipidemia Father   .  Cancer Maternal Aunt        ovarian  . Arthritis Maternal Grandmother   . Arthritis Maternal Grandfather   . Arthritis Paternal Grandmother   . Arthritis Paternal Grandfather   . Cancer Maternal Aunt        Breast  . Cancer Maternal Aunt        Lung  . Diabetes Neg Hx        family    Social History:  reports that she quit smoking about 21 years ago. She has never used smokeless tobacco. She reports that she drinks alcohol. She reports that she does not use drugs.  Allergies: No Known Allergies  ROS   She only takes as needed muscle relaxant   Examination:   BP 122/82   Pulse 64   Ht  (1.753 m)   Wt 199 lb 6.4 oz (90.4 kg)   LMP 03/11/2013   SpO2 97%   BMI 29.45 kg/m   She looks well.   Biceps reflexes appear normal     Assessment/ Plan:  Hypothyroidism,autoimmune  She appears to have progression of her hypothyroidism this year and requiring increasing supplementation  Currently with a combination of 50 g of levothyroxine and 10 g of liothyronine her thyroid levels are improved although TSH upper normal She looks euthyroid and subjectively has done better  Previously taking Armour Thyroid 60 mg daily since 06/2015; with this regimen her free T3 level was relatively lower also  She will continue the same regimen except increase her Levothyroxine by 1 tablet weekly  Will need follow-up in 3 months  Total visit time 15 minutes   Tyniesha Howald 03/31/2017, 8:47 AM

## 2017-03-31 NOTE — Patient Instructions (Signed)
Take Synthroid take extra pill weekly

## 2017-05-01 ENCOUNTER — Other Ambulatory Visit: Payer: Self-pay | Admitting: Internal Medicine

## 2017-05-01 DIAGNOSIS — M62838 Other muscle spasm: Secondary | ICD-10-CM

## 2017-05-01 MED ORDER — CYCLOBENZAPRINE HCL 5 MG PO TABS: 5.0000 mg | ORAL_TABLET | Freq: Three times a day (TID) | ORAL | 0 refills | Status: DC | PRN

## 2017-05-01 NOTE — Telephone Encounter (Signed)
Last filled 02/07/16 for muscle spasms... Please advise if appropriate to refill or does pt need OV

## 2017-05-01 NOTE — Telephone Encounter (Signed)
Copied from CRM [id number]. Topic: Quick Communication - See Telephone Encounter >> May 01, 2017  8:21 AM Louie BunPalacios Medina, Rosey Batheresa D wrote: CRM for notification. See Telephone encounter for: 05/01/17. Patient needs refill on her flexeril sent to her [facility], thanks.

## 2017-05-04 ENCOUNTER — Ambulatory Visit (INDEPENDENT_AMBULATORY_CARE_PROVIDER_SITE_OTHER): Payer: 59 | Admitting: Family Medicine

## 2017-05-04 ENCOUNTER — Ambulatory Visit: Payer: 59 | Admitting: Internal Medicine

## 2017-05-04 ENCOUNTER — Encounter: Payer: Self-pay | Admitting: Family Medicine

## 2017-05-04 VITALS — BP 122/76 | HR 60 | Temp 97.5°F | Wt 206.0 lb

## 2017-05-04 DIAGNOSIS — M79602 Pain in left arm: Secondary | ICD-10-CM

## 2017-05-04 DIAGNOSIS — G4700 Insomnia, unspecified: Secondary | ICD-10-CM | POA: Diagnosis not present

## 2017-05-04 DIAGNOSIS — M62838 Other muscle spasm: Secondary | ICD-10-CM

## 2017-05-04 MED ORDER — CYCLOBENZAPRINE HCL 5 MG PO TABS
5.0000 mg | ORAL_TABLET | Freq: Three times a day (TID) | ORAL | 0 refills | Status: DC | PRN
Start: 2017-05-04 — End: 2018-02-02

## 2017-05-04 MED ORDER — AMITRIPTYLINE HCL 25 MG PO TABS: 25.0000 mg | ORAL_TABLET | Freq: Every day | ORAL | 1 refills | Status: DC

## 2017-05-04 NOTE — Progress Notes (Signed)
Subjective:    Patient ID: Kelly Tran, female    DOB: May 27, 1966, 51 y.o.   MRN: [id number]  HPI This is a 51 yo female who presents today with left shoulder pain and anxiety. She has chronic problems with this shoulder and uses flexeril in past with some relief. Takes ibuprofen and topical pain medication with little relief. Has used heat/ice with temporary relief. Feels better today. No falls. Has seen ortho and was told she had bicep tear.  Increased stress at work, not sleeping well. No time to exercise or self care.   Past Medical History:  Diagnosis Date  . Allergy   . G E R D 02/22/2007  . Genital warts   . Hemorrhoids, internal 01/31/2011  . Migraine    Past Surgical History:  Procedure Laterality Date  . APPENDECTOMY    . DILATION AND CURETTAGE OF UTERUS    . miscarriage     x3--required d and c   Family History  Problem Relation Age of Onset  . Stroke Mother   . Arthritis Mother   . Hypertension Father   . Arthritis Father   . Hyperlipidemia Father   . Cancer Maternal Aunt        ovarian  . Arthritis Maternal Grandmother   . Arthritis Maternal Grandfather   . Arthritis Paternal Grandmother   . Arthritis Paternal Grandfather   . Cancer Maternal Aunt        Breast  . Cancer Maternal Aunt        Lung  . Diabetes Neg Hx        family   Social History  Substance Use Topics  . Smoking status: Former Smoker    Quit date: 01/31/1996  . Smokeless tobacco: Never Used  . Alcohol use 0.0 oz/week     Comment: occ      Review of Systems    per HPI Objective:   Physical Exam  Constitutional: She is oriented to person, place, and time. She appears well-developed and well-nourished. No distress.  Eyes: Conjunctivae are normal.  Cardiovascular: Normal rate.   Pulmonary/Chest: Effort normal.  Musculoskeletal:       Left shoulder: She exhibits tenderness. She exhibits normal range of motion and no bony tenderness.  Neurological: She is alert and oriented to  person, place, and time.  Skin: She is not diaphoretic.  Vitals reviewed.   BP 122/76   Pulse 60   Temp (!) 97.5 F (36.4 C) (Oral)   Wt 206 lb (93.4 kg)   LMP 03/11/2013   SpO2 98%   BMI 30.42 kg/m  Wt Readings from Last 3 Encounters:  05/04/17 206 lb (93.4 kg)  03/31/17 199 lb 6.4 oz (90.4 kg)  02/10/17 198 lb (89.8 kg)       Assessment & Plan:  1. Left arm pain - cyclobenzaprine (FLEXERIL) 5 MG tablet; Take 1 tablet (5 mg total) by mouth 3 (three) times daily as needed for muscle spasms.  Dispense: 20 tablet; Refill: 0  2. Insomnia, unspecified type - amitriptyline (ELAVIL) 25 MG tablet; Take 1 tablet (25 mg total) by mouth at bedtime.  Dispense: 90 tablet; Refill: 1 - encouraged improved self care, stress relief measures - follow up if no improvement in 4-6 weeks  3. Cervical paraspinal muscle spasm - cyclobenzaprine (FLEXERIL) 5 MG tablet; Take 1 tablet (5 mg total) by mouth 3 (three) times daily as needed for muscle spasms.  Dispense: 20 tablet; Refill: 0   Olean Ree, FNP-BC  [facility]  05/04/2017 2:38 PM

## 2017-05-07 ENCOUNTER — Ambulatory Visit (INDEPENDENT_AMBULATORY_CARE_PROVIDER_SITE_OTHER): Payer: 59

## 2017-05-07 ENCOUNTER — Encounter: Payer: Self-pay | Admitting: Podiatry

## 2017-05-07 ENCOUNTER — Ambulatory Visit (INDEPENDENT_AMBULATORY_CARE_PROVIDER_SITE_OTHER): Payer: 59 | Admitting: Podiatry

## 2017-05-07 VITALS — BP 119/65 | HR 73 | Resp 16

## 2017-05-07 DIAGNOSIS — B070 Plantar wart: Secondary | ICD-10-CM | POA: Diagnosis not present

## 2017-05-07 DIAGNOSIS — M722 Plantar fascial fibromatosis: Secondary | ICD-10-CM | POA: Diagnosis not present

## 2017-05-07 NOTE — Progress Notes (Signed)
Subjective:  Patient ID: Kelly Tran, female    DOB: [birth year],  MRN: [id number] HPI Chief Complaint  Patient presents with  . Foot Orthotics    Requesting new orthotics - active in 5K's and noticed at her last run her old ones were worn out  . Callouses    Plantar forefoot right - callused area-concerned that its a wart    51 y.o. female presents with the above complaint.   She is a history of plantar fasciitis which is controlled well with orthotics.  Past Medical History:  Diagnosis Date  . Allergy   . G E R D 02/22/2007  . Genital warts   . Hemorrhoids, internal 01/31/2011  . Migraine    Past Surgical History:  Procedure Laterality Date  . APPENDECTOMY    . DILATION AND CURETTAGE OF UTERUS    . miscarriage     x3--required d and c    Current Outpatient Prescriptions:  .  amitriptyline (ELAVIL) 25 MG tablet, Take 1 tablet (25 mg total) by mouth at bedtime., Disp: 90 tablet, Rfl: 1 .  aspirin 81 MG tablet, Take 81 mg by mouth daily., Disp: , Rfl:  .  b complex vitamins tablet, Take 1 tablet by mouth daily., Disp: , Rfl:  .  Calcium Carbonate-Vitamin D (CALCIUM 500 + D PO), Take 1 tablet by mouth daily. Reported on 09/10/2015, Disp: , Rfl:  .  cholecalciferol (VITAMIN D) 1000 units tablet, Take 5,000 Units by mouth., Disp: , Rfl:  .  cyclobenzaprine (FLEXERIL) 5 MG tablet, Take 1 tablet (5 mg total) by mouth 3 (three) times daily as needed for muscle spasms., Disp: 20 tablet, Rfl: 0 .  fluticasone (FLONASE) 50 MCG/ACT nasal spray, USE 2 SPRAYS INTO EACH NOSTRIL ONCE DAILY AS DIRECTED., Disp: 16 g, Rfl: 2 .  levothyroxine (SYNTHROID, LEVOTHROID) 50 MCG tablet, 1 tablet daily and extra 1 tablet once a week, Disp: 34 tablet, Rfl: 3 .  liothyronine (CYTOMEL) 5 MCG tablet, Take 2 tablets (10 mcg total) by mouth daily., Disp: 60 tablet, Rfl: 2 .  triamcinolone ointment (KENALOG) 0.5 %, Apply 1 application topically 2 (two) times daily., Disp: 30 g, Rfl: 0  No Known  Allergies Review of Systems  All other systems reviewed and are negative.  Objective:   Vitals:   05/07/17 0858  BP: 119/65  Pulse: 73  Resp: 16    General: Well developed, nourished, in no acute distress, alert and oriented x3   Dermatological: Skin is warm, dry and supple bilateral. Nails x 10 are well maintained; remaining integument appears unremarkable at this time. There are no open sores, no preulcerative lesions, no rash or signs of infection present. Very small primordial wart forefoot right.  Vascular: Dorsalis Pedis artery and Posterior Tibial artery pedal pulses are 2/4 bilateral with immedate capillary fill time. Pedal hair growth present. No varicosities and no lower extremity edema present bilateral.   Neruologic: Grossly intact via light touch bilateral. Vibratory intact via tuning fork bilateral. Protective threshold with Semmes Wienstein monofilament intact to all pedal sites bilateral. Patellar and Achilles deep tendon reflexes 2+ bilateral. No Babinski or clonus noted bilateral.   Musculoskeletal: No gross boney pedal deformities bilateral. No pain, crepitus, or limitation noted with foot and ankle range of motion bilateral. Muscular strength 5/5 in all groups tested bilateral.  Gait: Unassisted, Nonantalgic.    Radiographs:  None taken  Assessment & Plan:   Assessment: History of plantar fasciitis. Early plantar wart on her aspect forefoot  right.  Plan: Chemical destruction lesion under occlusion with Cantharone today to be washed off thoroughly tomorrow. She was also molded for custom orthotics.     Laquinton Bihm T. [place], [place]

## 2017-05-12 ENCOUNTER — Other Ambulatory Visit: Payer: Self-pay | Admitting: Endocrinology

## 2017-05-22 ENCOUNTER — Emergency Department (HOSPITAL_COMMUNITY): Payer: No Typology Code available for payment source

## 2017-05-22 ENCOUNTER — Observation Stay (HOSPITAL_COMMUNITY)
Admission: EM | Admit: 2017-05-22 | Discharge: 2017-05-23 | Disposition: A | Discharge status: Home / Self Care | Payer: No Typology Code available for payment source | Attending: Family Medicine | Admitting: Family Medicine

## 2017-05-22 ENCOUNTER — Ambulatory Visit (INDEPENDENT_AMBULATORY_CARE_PROVIDER_SITE_OTHER)
Admission: EM | Admit: 2017-05-22 | Discharge: 2017-05-22 | Disposition: A | Discharge status: Home / Self Care | Payer: No Typology Code available for payment source | Source: Home / Self Care

## 2017-05-22 ENCOUNTER — Encounter (HOSPITAL_COMMUNITY): Payer: Self-pay | Admitting: Emergency Medicine

## 2017-05-22 ENCOUNTER — Encounter (HOSPITAL_COMMUNITY): Payer: Self-pay | Admitting: *Deleted

## 2017-05-22 ENCOUNTER — Other Ambulatory Visit: Payer: Self-pay

## 2017-05-22 DIAGNOSIS — Z7982 Long term (current) use of aspirin: Secondary | ICD-10-CM | POA: Diagnosis not present

## 2017-05-22 DIAGNOSIS — K219 Gastro-esophageal reflux disease without esophagitis: Secondary | ICD-10-CM | POA: Insufficient documentation

## 2017-05-22 DIAGNOSIS — R079 Chest pain, unspecified: Secondary | ICD-10-CM | POA: Diagnosis not present

## 2017-05-22 DIAGNOSIS — E039 Hypothyroidism, unspecified: Secondary | ICD-10-CM | POA: Diagnosis not present

## 2017-05-22 DIAGNOSIS — I959 Hypotension, unspecified: Secondary | ICD-10-CM | POA: Insufficient documentation

## 2017-05-22 DIAGNOSIS — R739 Hyperglycemia, unspecified: Secondary | ICD-10-CM | POA: Insufficient documentation

## 2017-05-22 DIAGNOSIS — Z87891 Personal history of nicotine dependence: Secondary | ICD-10-CM | POA: Diagnosis not present

## 2017-05-22 DIAGNOSIS — Z79899 Other long term (current) drug therapy: Secondary | ICD-10-CM | POA: Insufficient documentation

## 2017-05-22 HISTORY — DX: Hypothyroidism, unspecified: E03.9

## 2017-05-22 LAB — URINALYSIS, ROUTINE W REFLEX MICROSCOPIC
GLUCOSE, UA: NEGATIVE mg/dL
Hgb urine dipstick: NEGATIVE
KETONES UR: 5 mg/dL — AB
Leukocytes, UA: NEGATIVE
Nitrite: NEGATIVE
PROTEIN: 30 mg/dL — AB
SQUAMOUS EPITHELIAL / LPF: NONE SEEN
Specific Gravity, Urine: 1.031 — ABNORMAL HIGH (ref 1.005–1.030)
pH: 5 (ref 5.0–8.0)

## 2017-05-22 LAB — BASIC METABOLIC PANEL
Anion gap: 7 (ref 5–15)
BUN: 16 mg/dL (ref 6–20)
CALCIUM: 9.6 mg/dL (ref 8.9–10.3)
CO2: 25 mmol/L (ref 22–32)
CREATININE: 0.79 mg/dL (ref 0.44–1.00)
Chloride: 106 mmol/L (ref 101–111)
GFR calc Af Amer: >60 mL/min (ref >60)
GFR calc non Af Amer: >60 mL/min (ref >60)
Glucose, Bld: 107 mg/dL — ABNORMAL HIGH (ref 65–99)
Potassium: 3.7 mmol/L (ref 3.5–5.1)
Sodium: 138 mmol/L (ref 135–145)

## 2017-05-22 LAB — CBC
HCT: 40.8 % (ref 36.0–46.0)
HEMATOCRIT: 38 % (ref 36.0–46.0)
HEMOGLOBIN: 12.8 g/dL (ref 12.0–15.0)
Hemoglobin: 13.7 g/dL (ref 12.0–15.0)
MCH: 29.7 pg (ref 26.0–34.0)
MCH: 29.7 pg (ref 26.0–34.0)
MCHC: 33.6 g/dL (ref 30.0–36.0)
MCHC: 33.7 g/dL (ref 30.0–36.0)
MCV: 88.5 fL (ref 78.0–100.0)
MCV: 88.2 fL (ref 78.0–100.0)
PLATELETS: 302 10*3/uL (ref 150–400)
Platelets: 306 10*3/uL (ref 150–400)
RBC: 4.61 MIL/uL (ref 3.87–5.11)
RBC: 4.31 MIL/uL (ref 3.87–5.11)
RDW: 12.1 % (ref 11.5–15.5)
RDW: 12.1 % (ref 11.5–15.5)
WBC: 11.6 10*3/uL — ABNORMAL HIGH (ref 4.0–10.5)
WBC: 11.1 10*3/uL — AB (ref 4.0–10.5)

## 2017-05-22 LAB — TROPONIN I: Troponin I: <0.03 ng/mL (ref <0.03)

## 2017-05-22 LAB — I-STAT TROPONIN, ED: TROPONIN I, POC: 0 ng/mL (ref 0.00–0.08)

## 2017-05-22 IMAGING — DX DG CHEST 1V PORT
1 series · 1 of 1 positions shown · non-contrast
Comparison: [DATE]

CLINICAL DATA: Left-sided chest pain

EXAM:
PORTABLE CHEST 1 VIEW

[chest ap]
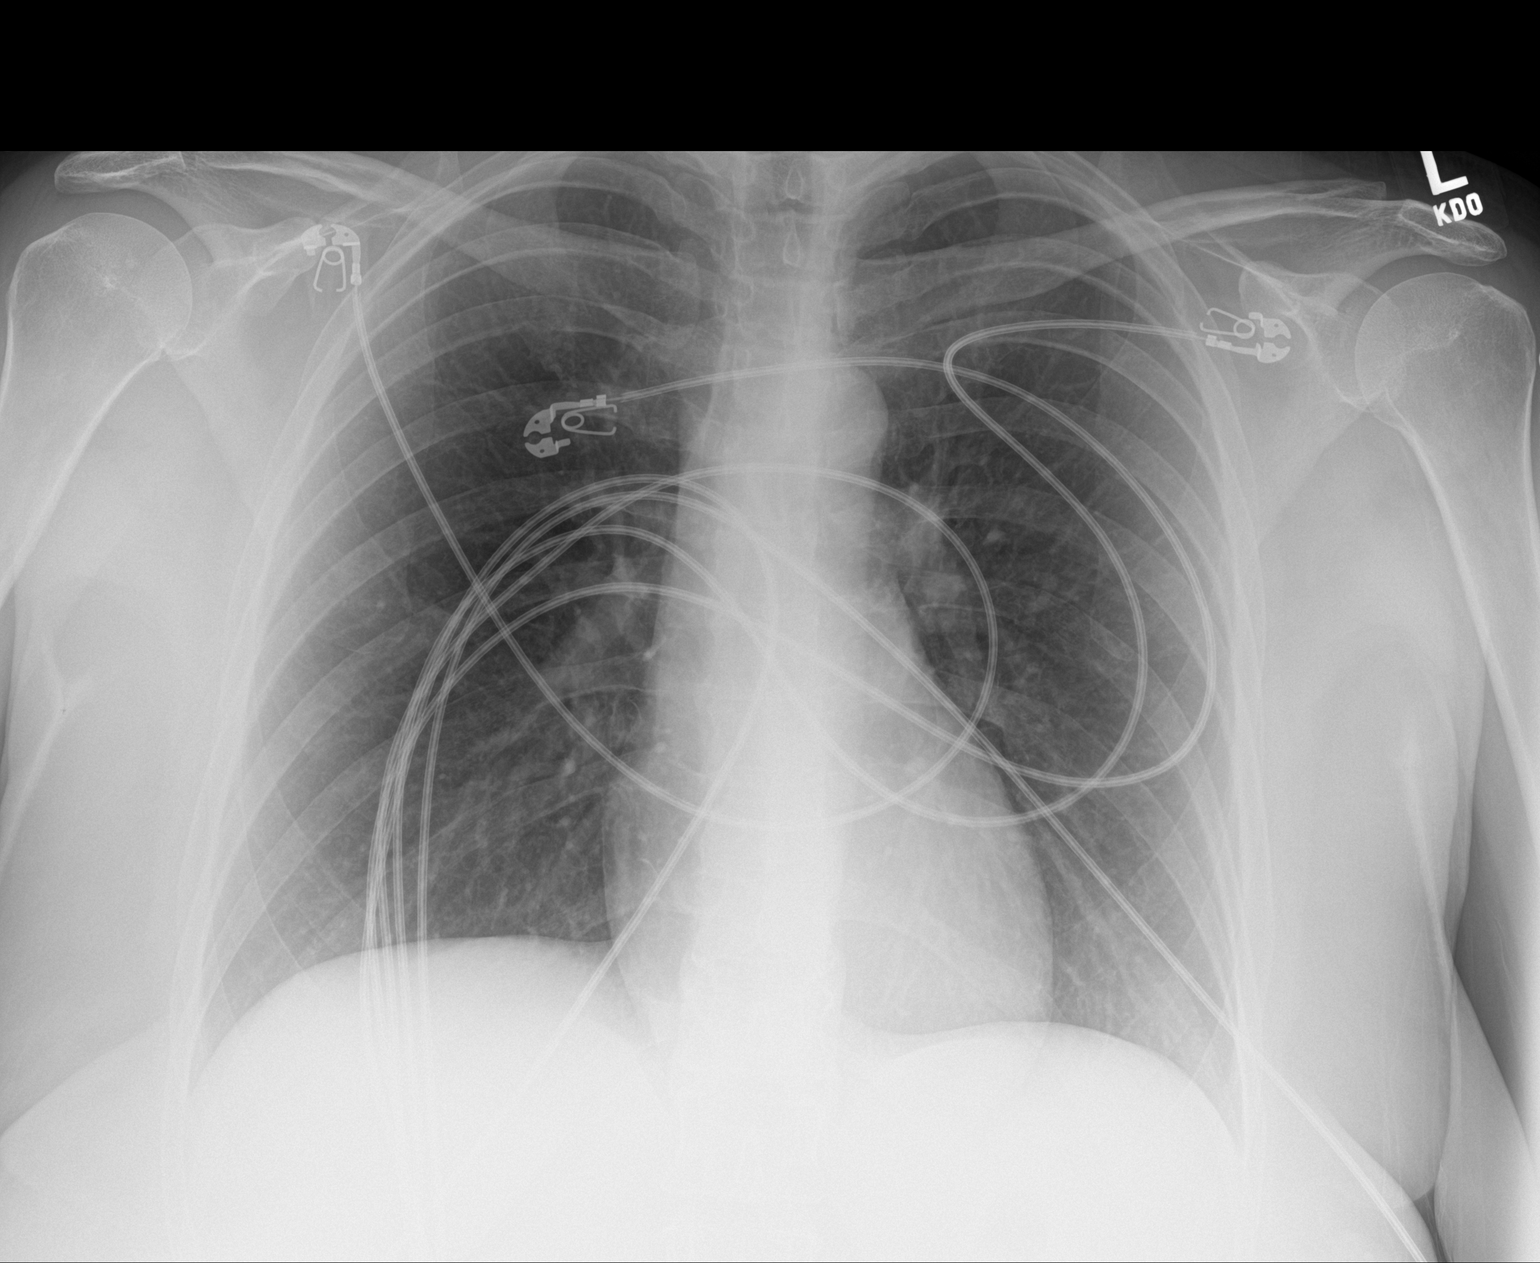

[1 of 1 positions shown; findings below may reference images not displayed]

FINDINGS: The heart size and mediastinal contours are within normal limits.
Both lungs are clear. The visualized skeletal structures are
unremarkable.
IMPRESSION: No active disease.

## 2017-05-22 MED ORDER — ATORVASTATIN CALCIUM 80 MG PO TABS: 80.0000 mg | ORAL_TABLET | Freq: Every day | ORAL | Status: DC

## 2017-05-22 MED ORDER — NITROGLYCERIN 0.4 MG SL SUBL
0.4000 mg | SUBLINGUAL_TABLET | SUBLINGUAL | Status: DC | PRN
  Administered 2017-05-22: 0.4 mg via SUBLINGUAL
  Filled 2017-05-22: qty 1

## 2017-05-22 MED ORDER — ENOXAPARIN SODIUM 40 MG/0.4ML ~~LOC~~ SOLN
40.0000 mg | SUBCUTANEOUS | Status: DC
  Administered 2017-05-22: 40 mg via SUBCUTANEOUS
  Filled 2017-05-22: qty 0.4

## 2017-05-22 MED ORDER — SODIUM CHLORIDE 0.9 % IV SOLN
INTRAVENOUS | Status: AC
  Administered 2017-05-22: 21:00:00 via INTRAVENOUS

## 2017-05-22 MED ORDER — LORATADINE 10 MG PO TABS
10.0000 mg | ORAL_TABLET | Freq: Every day | ORAL | Status: DC
Start: 2017-05-22 — End: 2017-05-22

## 2017-05-22 MED ORDER — LIOTHYRONINE SODIUM 5 MCG PO TABS
10.0000 ug | ORAL_TABLET | Freq: Every day | ORAL | Status: DC
  Administered 2017-05-23: 10 ug via ORAL
  Filled 2017-05-22: qty 2

## 2017-05-22 MED ORDER — AMITRIPTYLINE HCL 25 MG PO TABS: 25.0000 mg | ORAL_TABLET | Freq: Every evening | ORAL | Status: DC | PRN

## 2017-05-22 MED ORDER — CYCLOBENZAPRINE HCL 10 MG PO TABS: 5.0000 mg | ORAL_TABLET | Freq: Three times a day (TID) | ORAL | Status: DC | PRN

## 2017-05-22 MED ORDER — ASPIRIN EC 81 MG PO TBEC
81.0000 mg | DELAYED_RELEASE_TABLET | Freq: Every day | ORAL | Status: DC
  Administered 2017-05-23: 81 mg via ORAL
  Filled 2017-05-22: qty 1

## 2017-05-22 MED ORDER — LEVOTHYROXINE SODIUM 50 MCG PO TABS
50.0000 ug | ORAL_TABLET | ORAL | Status: DC
  Administered 2017-05-23: 50 ug via ORAL
  Filled 2017-05-22: qty 1

## 2017-05-22 MED ORDER — FENTANYL CITRATE (PF) 100 MCG/2ML IJ SOLN
50.0000 ug | Freq: Once | INTRAMUSCULAR | Status: AC
  Administered 2017-05-22: 50 ug via INTRAVENOUS
  Filled 2017-05-22: qty 2

## 2017-05-22 MED ORDER — POLYETHYL GLYCOL-PROPYL GLYCOL 0.4-0.3 % OP GEL: Freq: Every day | OPHTHALMIC | Status: DC | PRN

## 2017-05-22 MED ORDER — ASPIRIN 81 MG PO CHEW
324.0000 mg | CHEWABLE_TABLET | Freq: Once | ORAL | Status: AC
Start: 2017-05-22 — End: 2017-05-22
  Administered 2017-05-22: 324 mg via ORAL
  Filled 2017-05-22: qty 4

## 2017-05-22 MED ORDER — LEVOTHYROXINE SODIUM 50 MCG PO TABS: 50.0000 ug | ORAL_TABLET | Freq: Every day | ORAL | Status: DC

## 2017-05-22 MED ORDER — ACETAMINOPHEN 325 MG PO TABS: 650.0000 mg | ORAL_TABLET | Freq: Four times a day (QID) | ORAL | Status: DC | PRN

## 2017-05-22 MED ORDER — LEVOTHYROXINE SODIUM 100 MCG PO TABS: 100.0000 ug | ORAL_TABLET | ORAL | Status: DC

## 2017-05-22 MED ORDER — ACETAMINOPHEN 650 MG RE SUPP: 650.0000 mg | Freq: Four times a day (QID) | RECTAL | Status: DC | PRN

## 2017-05-22 MED ORDER — ARTIFICIAL TEARS OPHTHALMIC OINT: TOPICAL_OINTMENT | Freq: Every day | OPHTHALMIC | Status: DC | PRN

## 2017-05-22 MED ORDER — PYRIDOXINE HCL 25 MG PO TABS
25.0000 mg | ORAL_TABLET | Freq: Every day | ORAL | Status: DC
  Administered 2017-05-23: 25 mg via ORAL
  Filled 2017-05-22 (×2): qty 1

## 2017-05-22 NOTE — ED Notes (Signed)
Pt bradycardic in triage, reporting she is going to pass out, # 18 L upper forearm placed, pt feet up in stretcher chair, will continue to monitor, pt to go to B19, per pt request this RN called the pts husband and LVM

## 2017-05-22 NOTE — ED Triage Notes (Signed)
PT C/O: constant CP   ONSET: 1230-1300  SX INCLUDE: left numbness/tingly, nausea, hot flashes  DENIES: blurred vision, HA, diaphoresis  TAKING MEDS: amitriptyline   Hx of anxiety   Pt was on phone texting  A&O x4... NAD... Ambulatory

## 2017-05-22 NOTE — ED Triage Notes (Signed)
Pt in c/o Mid non radiating CP with SOB while at work, pt diaphoretic in triage, pt reports feeling dizzy, pt c/o nausea & dizziness, pt A&O x4

## 2017-05-22 NOTE — ED Provider Notes (Signed)
Emergency Department Provider Note   I have reviewed the triage vital signs and the nursing notes.   HISTORY  Chief Complaint Chest Pain   HPI Kelly Tran is a 51 y.o. female with multiple medical problems as documented below the presents to the emergency department today secondary to chest pain.  Patient states she was standing at work and had acute onset of left-sided chest achiness.  It is unlike anything she is ever felt before.  She states that she had anxiety with chest pain or shortness of breath in the past but that was a different type of pain.  This quickly became associated with flushing, and nausea and left arm tingling.  Those symptoms shortly resolved besides the tingling and left-sided chest pain.  She went to urgent care who sent her here for evaluation.  She does have any history of smoking or hypertension hyperlipidemia.  Family history of coronary artery disease of unknown age.  Other associated modifying symptoms.  She did [place] travel to the beach but is having lower extremity swelling or history of blood clots.  No trauma or rashes.    Past Medical History:  Diagnosis Date  . Allergy   . G E R D 02/22/2007  . Genital warts   . Hemorrhoids, internal 01/31/2011  . Migraine     Patient Active Problem List   Diagnosis Date Noted  . 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 Acute pain of left shoulder 06/10/2016  . GERD (gastroesophageal reflux disease) 11/20/2015  . Insomnia 11/20/2015  . Vitamin D deficiency 11/20/2015  . Seasonal allergies 09/25/2014  . External hemorrhoid 04/26/2013  . Hypothyroidism 03/22/2013    Past Surgical History:  Procedure Laterality Date  . APPENDECTOMY    . DILATION AND CURETTAGE OF UTERUS    . miscarriage     x3--required d and c    Current Outpatient Rx  . Order #: [id number] Class: Normal  . Order #: [id number] Class: Historical Med  . Order #: [id number] Class: Historical Med  . Order #: [id number] Class:  Historical Med  . Order #: [id number] Class: Historical Med  . Order #: [id number] Class: Normal  . Order #: [id number] Class: Historical Med  . Order #: [id number] Class: Normal  . Order #: [id number] Class: Historical Med  . Order #: [id number] Class: Normal  . Order #: [id number] Class: Normal  . Order #: [id number] Class: Historical Med  . Order #: [id number] Class: Historical Med  . Order #: [id number] Class: Historical Med  . Order #: [id number] Class: Historical Med  . Order #: [id number] Class: Normal    Allergies Patient has no known allergies.  Family History  Problem Relation Age of Onset  . Stroke Mother   . Arthritis Mother   . Hypertension Father   . Arthritis Father   . Hyperlipidemia Father   . Cancer Maternal Aunt        ovarian  . Arthritis Maternal Grandmother   . Arthritis Maternal Grandfather   . Arthritis Paternal Grandmother   . Arthritis Paternal Grandfather   . Cancer Maternal Aunt        Breast  . Cancer Maternal Aunt        Lung  . Diabetes Neg Hx        family    Social History Social History   Tobacco Use  . Smoking status: Former Smoker    Last attempt to quit: 01/31/1996    Years since quitting: 21.3  . Smokeless tobacco: Never Used  Substance Use Topics  . Alcohol use: Yes  Alcohol/week: 0.0 oz    Comment: occ  . Drug use: No    Review of Systems  All other systems negative except as documented in the HPI. All pertinent positives and negatives as reviewed in the HPI. ____________________________________________   PHYSICAL EXAM:  VITAL SIGNS: ED Triage Vitals  Enc Vitals Group     BP 05/22/17 1500 (!) 85/43     Pulse Rate 05/22/17 1501 (!) 40     Resp 05/22/17 1509 14     Temp 05/22/17 1509 98 F (36.7 C)     Temp Source 05/22/17 1509 Oral     SpO2 05/22/17 1509 100 %     Weight --      Height --      Head Circumference --      Peak Flow --      Pain Score 05/22/17 1500 3     Pain Loc --      Pain Edu? --      Excl. in GC? --      Constitutional: Alert and oriented. Well appearing and in no acute distress. Eyes: Conjunctivae are normal. PERRL. EOMI. Head: Atraumatic. Nose: No congestion/rhinnorhea. Mouth/Throat: Mucous membranes are moist.  Oropharynx non-erythematous. Neck: No stridor.  No meningeal signs.   Cardiovascular: Normal rate, regular rhythm. Good peripheral circulation. Grossly normal heart sounds.   Respiratory: Normal respiratory effort.  No retractions. Lungs CTAB. Gastrointestinal: Soft and nontender. No distention.  Musculoskeletal: No lower extremity tenderness nor edema. No gross deformities of extremities. Neurologic:  Normal speech and language. No gross focal neurologic deficits are appreciated.  Skin:  Skin is warm, dry and intact. No rash noted.   ____________________________________________   LABS (all labs ordered are listed, but only abnormal results are displayed)  Labs Reviewed  BASIC METABOLIC PANEL - Abnormal; Notable for the following components:      Result Value   Glucose, Bld 107 (*)    All other components within normal limits  CBC - Abnormal; Notable for the following components:   WBC 11.6 (*)    All other components within normal limits  URINALYSIS, ROUTINE W REFLEX MICROSCOPIC  I-STAT TROPONIN, ED   ____________________________________________  EKG   EKG Interpretation  Date/Time:  Friday May 22 2017 14:53:48 EST Ventricular Rate:  76 PR Interval:  132 QRS Duration: 86 QT Interval:  374 QTC Calculation: 420 R Axis:   49 Text Interpretation:  Normal sinus rhythm Low voltage QRS Borderline ECG No significant change since last tracing earlier in day, no old tracing to compare earlier Confirmed by Marily Memos [phone]) on 05/22/2017 3:16:44 PM       ____________________________________________  RADIOLOGY  Dg Chest Portable 1 View  Result Date: 05/22/2017 CLINICAL DATA:  Left-sided chest pain EXAM: PORTABLE CHEST 1 VIEW COMPARISON:  01/15/2015  FINDINGS: The heart size and mediastinal contours are within normal limits. Both lungs are clear. The visualized skeletal structures are unremarkable. IMPRESSION: No active disease. Electronically Signed   By: Jasmine Pang M.D.   On: 05/22/2017 15:46    ____________________________________________   PROCEDURES  Procedure(s) performed:   Procedures   ____________________________________________   INITIAL IMPRESSION / ASSESSMENT AND PLAN / ED COURSE  Pertinent labs & imaging results that were available during my care of the patient were reviewed by me and considered in my medical decision making (see chart for details).  Patient with heart score of 4 secondary to age and description of symptoms.  No risk factors but concern for possible ACS  as it will have another likely cause for her symptoms.  Low likelihood for PE.  Does not seem to be GI related.  Does not seem to be anxiety related.  Plan for hospitalist observation.  ____________________________________________  FINAL CLINICAL IMPRESSION(S) / ED DIAGNOSES  Final diagnoses:  Chest pain, unspecified type    MEDICATIONS GIVEN DURING THIS VISIT:  Medications  nitroGLYCERIN (NITROSTAT) SL tablet 0.4 mg (0.4 mg Sublingual Given 05/22/17 1553)  fentaNYL (SUBLIMAZE) injection 50 mcg (not administered)  aspirin chewable tablet 324 mg (324 mg Oral Given 05/22/17 1552)  fentaNYL (SUBLIMAZE) injection 50 mcg (50 mcg Intravenous Given 05/22/17 1652)     NEW OUTPATIENT MEDICATIONS STARTED DURING THIS VISIT:  This SmartLink is deprecated. Use AVSMEDLIST instead to display the medication list for a patient.  Note:  This document was prepared using Dragon voice recognition software and may include unintentional dictation errors.   Marily MemosMesner, Cloey Sferrazza, MD 05/23/17 [id number]

## 2017-05-22 NOTE — ED Provider Notes (Signed)
[age] year old female comes in with few hour history of constant left sided chest pain with numbness/tingling of the left arm. She has waves of nausea and hot flashes. Former smoker. Family history of CVA, HTN, HLD, CAD. Vitals stable. EKG NSR, 70bpm, with no acute changes from EKG. However, given story, and patient's worry of cardiac causes of symptoms, will discharge in stable condition to the emergency department for further evaluation.    Belinda FisherYu, Nataleah Scioneaux V, PA-C 05/22/17 1440

## 2017-05-22 NOTE — ED Notes (Signed)
EDP aware of pt BP 

## 2017-05-22 NOTE — Discharge Instructions (Signed)
Due to symptoms, please go to the ED for further evaluation.

## 2017-05-22 NOTE — H&P (Signed)
TRH H&P   Patient Demographics:    Kelly Tran, is a 51 y.o. female  MRN: [id number]   DOB - 05-24-1966  Admit Date - 05/22/2017  Outpatient Primary MD for the patient is Sampson Si Salvadore Oxford, NP  Referring MD/NP/PA:  Dr. Clayborne Dana  Outpatient Specialists:   Patient coming from: home  Chief Complaint  Patient presents with  . Chest Pain      HPI:    Kelly Tran  is a 51 y.o. female, w c/o chest pain for the past 2 months.  Felt like it was anxiety related.  wentto pcp 3 weeks ago.  And went back to elavil for anxiety.  Today she was fineandthen at work it started again.  Pain left sided dull achy with radiation to the left arm.  Started about 1pm.  Was standing at her desk.  Denies fever, chills, cough,sob,  palp, n/v, diarrhea, brbpr, black stool.  Pt given slg nitro in the ED without relief.    In ED,   CXR no active disease ekg nsr at 75, nl axis, no st-t changes c/w ischemia.  Trop negative  Wbc 11.6, Hgb 13.7, Plt 302 Glucose 107, Bun 16, Creatinne 0.79  Pt will be admitted for chest pain r/o    Review of systems:    In addition to the HPI above,  No Fever-chills, No Headache, No changes with Vision or hearing, No problems swallowing food or Liquids, No  Cough or Shortness of Breath, No Abdominal pain, No Nausea or Vommitting, Bowel movements are regular, No Blood in stool or Urine, No dysuria, No new skin rashes or bruises, No new joints pains-aches,  No new weakness, tingling, numbness in any extremity, No recent weight gain or loss, No polyuria, polydypsia or polyphagia, No significant Mental Stressors.  A full 10 point Review of Systems was done, except as stated above, all other Review of Systems were negative.   With Past History of the following :    Past Medical History:  Diagnosis Date  . Allergy   . G E R D 02/22/2007  . Genital warts    . Hemorrhoids, internal 01/31/2011  . Migraine       Past Surgical History:  Procedure Laterality Date  . APPENDECTOMY    . DILATION AND CURETTAGE OF UTERUS    . miscarriage     x3--required d and c      Social History:     Social History   Tobacco Use  . Smoking status: Former Smoker    Last attempt to quit: 01/31/1996    Years since quitting: 21.3  . Smokeless tobacco: Never Used  Substance Use Topics  . Alcohol use: Yes    Alcohol/week: 0.0 oz    Comment: occ     Lives - at home  Mobility - walks by self   Family History :  Family History  Problem Relation Age of Onset  . Stroke Mother   . Arthritis Mother   . Hypertension Father   . Arthritis Father   . Hyperlipidemia Father   . Cancer Maternal Aunt        ovarian  . Arthritis Maternal Grandmother   . Arthritis Maternal Grandfather   . Arthritis Paternal Grandmother   . Arthritis Paternal Grandfather   . Cancer Maternal Aunt        Breast  . Cancer Maternal Aunt        Lung  . Diabetes Neg Hx        family     Home Medications:   Prior to Admission medications   Medication Sig Start Date End Date Taking? Authorizing Provider  amitriptyline (ELAVIL) 25 MG tablet Take 1 tablet (25 mg total) by mouth at bedtime. Patient taking differently: Take 25 mg at bedtime as needed by mouth (anxiety/increased stress).  05/04/17  Yes Emi BelfastGessner, Deborah B, FNP  aspirin EC 81 MG tablet Take 81 mg daily by mouth.   Yes [provider]  CALCIUM PO Take 1 tablet daily with lunch by mouth.   Yes [provider]  Cholecalciferol (VITAMIN D PO) Take 500 Units daily by mouth.   Yes [provider]  Cyanocobalamin (VITAMIN B-12 PO) Take 1 tablet daily with lunch by mouth.   Yes [provider]  cyclobenzaprine (FLEXERIL) 5 MG tablet Take 1 tablet (5 mg total) by mouth 3 (three) times daily as needed for muscle spasms. 05/04/17  Yes Emi BelfastGessner, Deborah B, FNP  Fexofenadine HCl (ALLEGRA  PO) Take 1 tablet daily as needed by mouth (seasonal allergies).   Yes [provider]  fluticasone (FLONASE) 50 MCG/ACT nasal spray USE 2 SPRAYS INTO EACH NOSTRIL ONCE DAILY AS DIRECTED. Patient taking differently: USE 2 SPRAYS INTO EACH NOSTRIL ONCE DAILY AS NEEDED FOR SEASONAL ALLERGIES 08/15/16  Yes Baity, Salvadore Oxfordegina W, NP  ibuprofen (ADVIL,MOTRIN) 200 MG tablet Take 800 mg daily as needed by mouth for headache.   Yes [provider]  levothyroxine (SYNTHROID, LEVOTHROID) 50 MCG tablet 1 tablet daily and extra 1 tablet once a week Patient taking differently: Take 50-100 mcg daily before breakfast by mouth. Take 2 tablets (100 mcg) by mouth on Wednesday mornings, take 1 tablet (50 mcg) on all other days of the week - before breakfast 03/31/17  Yes Reather LittlerKumar, Ajay, MD  liothyronine (CYTOMEL) 5 MCG tablet TAKE TWO TABLETS BY MOUTH DAILY Patient taking differently: TAKE TWO TABLETS (10 MCG) BY MOUTH DAILY BEFORE BREAKFAST 05/12/17  Yes Reather LittlerKumar, Ajay, MD  Multiple Vitamin (MULTIVITAMIN WITH MINERALS) TABS tablet Take 1 tablet daily with lunch by mouth.   Yes [provider]  naproxen sodium (ALEVE) 220 MG tablet Take 220 mg 2 (two) times daily as needed by mouth (pain/headache).   Yes [provider]  Polyethyl Glycol-Propyl Glycol (SYSTANE OP) Place 1 drop daily as needed into both eyes (dry eyes).   Yes [provider]  Pyridoxine HCl (VITAMIN B-6 PO) Take 1 tablet daily with lunch by mouth.   Yes [provider]  triamcinolone ointment (KENALOG) 0.5 % Apply 1 application topically 2 (two) times daily. Patient not taking: Reported on 05/22/2017 07/28/16   Lorre MunroeBaity, Regina W, NP     Allergies:    No Known Allergies   Physical Exam:   Vitals  Blood pressure 101/63, pulse 65, temperature 98 F (36.7 C), temperature source Oral, resp. rate 20, last menstrual period  03/11/2013, SpO2 98 %.   1. General  lying in bed in NAD,   2. Normal affect and insight,  Not Suicidal or Homicidal, Awake Alert, Oriented X 3.  3. No F.N deficits, ALL C.Nerves Intact, Strength 5/5 all 4 extremities, Sensation intact all 4 extremities, Plantars down going.  4. Ears and Eyes appear Normal, Conjunctivae clear, PERRLA. Moist Oral Mucosa.  5. Supple Neck, No JVD, No cervical lymphadenopathy appriciated, No Carotid Bruits.  6. Symmetrical Chest wall movement, Good air movement bilaterally, CTAB.  7. RRR, No Gallops, Rubs or Murmurs, No Parasternal Heave.  8. Positive Bowel Sounds, Abdomen Soft, No tenderness, No organomegaly appriciated,No rebound -guarding or rigidity.  9.  No Cyanosis, Normal Skin Turgor, No Skin Rash or Bruise.  10. Good muscle tone,  joints appear normal , no effusions, Normal ROM.  11. No Palpable Lymph Nodes in Neck or Axillae    Data Review:    CBC Recent Labs  Lab 05/22/17 1505  WBC 11.6*  HGB 13.7  HCT 40.8  PLT 302  MCV 88.5  MCH 29.7  MCHC 33.6  RDW 12.1   ------------------------------------------------------------------------------------------------------------------  Chemistries  Recent Labs  Lab 05/22/17 1505  NA 138  K 3.7  CL 106  CO2 25  GLUCOSE 107*  BUN 16  CREATININE 0.79  CALCIUM 9.6   ------------------------------------------------------------------------------------------------------------------ CrCl cannot be calculated (Unknown ideal weight.). ------------------------------------------------------------------------------------------------------------------ No results for input(s): TSH, T4TOTAL, T3FREE, THYROIDAB in the last 72 hours.  Invalid input(s): FREET3  Coagulation profile No results for input(s): INR, PROTIME in the last 168 hours. ------------------------------------------------------------------------------------------------------------------- No results for input(s): DDIMER in the last 72  hours. -------------------------------------------------------------------------------------------------------------------  Cardiac Enzymes No results for input(s): CKMB, TROPONINI, MYOGLOBIN in the last 168 hours.  Invalid input(s): CK ------------------------------------------------------------------------------------------------------------------ No results found for: BNP   ---------------------------------------------------------------------------------------------------------------  Urinalysis    Component Value Date/Time   COLORURINE YELLOW 04/04/2009 0752   APPEARANCEUR CLEAR 04/04/2009 0752   LABSPEC 1.010 04/04/2009 0752   PHURINE 6.5 04/04/2009 0752   GLUCOSEU NEGATIVE 04/04/2009 0752   HGBUR negative 02/16/2007 1032   BILIRUBINUR neg 09/10/2015 1620   KETONESUR NEGATIVE 04/04/2009 0752   PROTEINUR neg 09/10/2015 1620   UROBILINOGEN negative 09/10/2015 1620   UROBILINOGEN 0.2 04/04/2009 0752   NITRITE neg 09/10/2015 1620   NITRITE NEGATIVE 04/04/2009 0752   LEUKOCYTESUR small (1+) (A) 09/10/2015 1620    ----------------------------------------------------------------------------------------------------------------   Imaging Results:    Dg Chest Portable 1 View  Result Date: 05/22/2017 CLINICAL DATA:  Left-sided chest pain EXAM: PORTABLE CHEST 1 VIEW COMPARISON:  01/15/2015 FINDINGS: The heart size and mediastinal contours are within normal limits. Both lungs are clear. The visualized skeletal structures are unremarkable. IMPRESSION: No active disease. Electronically Signed   By: Jasmine PangKim  Fujinaga M.D.   On: 05/22/2017 15:46      Assessment & Plan:    Principal Problem:   Chest pain Active Problems:   Hypothyroidism   GERD (gastroesophageal reflux disease)   Hyperglycemia    Chest pain Tele Trop I q6h x3 Check lipid,  Cardiac echo Nuclear stress test Cardiology consult requested by email.  Aspirin, lipitor, no betablocker due to relative bradycardia.    Hyperglycemia Check hga1c,   Hypothyroidism Check tsh Cont levothyroxine  Anxiety Cont elavil      DVT Prophylaxis  Lovenox - SCDs   AM Labs Ordered, also please review Full Orders  Family Communication: Admission, patients condition and plan of care including tests being ordered have been discussed with the patient who  indicate understanding and agree with the plan and Code Status.  Code Status FULL CODE  Likely DC to  home  Condition GUARDED  Consults called: cardiology by email  Admission status: obs  Time spent in minutes : 45   Pearson Grippe M.D on 05/22/2017 at 6:21 PM  Between 7am to 7pm - Pager - [phone]. After 7pm go to [email or web address] - password [facility]  [facility] - Office  [phone]

## 2017-05-22 NOTE — ED Notes (Signed)
Attempted report x1. 

## 2017-05-22 NOTE — ED Notes (Signed)
ED Provider at bedside. 

## 2017-05-22 NOTE — ED Notes (Signed)
EKG given to Linward HeadlandAmy Yu, NP

## 2017-05-23 ENCOUNTER — Encounter (HOSPITAL_COMMUNITY): Payer: Self-pay | Admitting: Adult Health

## 2017-05-23 ENCOUNTER — Observation Stay (HOSPITAL_BASED_OUTPATIENT_CLINIC_OR_DEPARTMENT_OTHER): Payer: No Typology Code available for payment source

## 2017-05-23 DIAGNOSIS — R079 Chest pain, unspecified: Secondary | ICD-10-CM

## 2017-05-23 DIAGNOSIS — R0789 Other chest pain: Secondary | ICD-10-CM | POA: Diagnosis not present

## 2017-05-23 DIAGNOSIS — E039 Hypothyroidism, unspecified: Secondary | ICD-10-CM

## 2017-05-23 DIAGNOSIS — K219 Gastro-esophageal reflux disease without esophagitis: Secondary | ICD-10-CM

## 2017-05-23 DIAGNOSIS — R739 Hyperglycemia, unspecified: Secondary | ICD-10-CM | POA: Diagnosis not present

## 2017-05-23 LAB — COMPREHENSIVE METABOLIC PANEL
ALBUMIN: 3.4 g/dL — AB (ref 3.5–5.0)
ALK PHOS: 80 U/L (ref 38–126)
ALT: 21 U/L (ref 14–54)
AST: 19 U/L (ref 15–41)
Anion gap: 7 (ref 5–15)
BILIRUBIN TOTAL: 0.9 mg/dL (ref 0.3–1.2)
BUN: 13 mg/dL (ref 6–20)
CALCIUM: 8.7 mg/dL — AB (ref 8.9–10.3)
CO2: 24 mmol/L (ref 22–32)
CREATININE: 0.63 mg/dL (ref 0.44–1.00)
Chloride: 107 mmol/L (ref 101–111)
GFR calc Af Amer: >60 mL/min (ref >60)
GFR calc non Af Amer: >60 mL/min (ref >60)
GLUCOSE: 134 mg/dL — AB (ref 65–99)
Potassium: 3.6 mmol/L (ref 3.5–5.1)
Sodium: 138 mmol/L (ref 135–145)
TOTAL PROTEIN: 5.7 g/dL — AB (ref 6.5–8.1)

## 2017-05-23 LAB — LIPID PANEL
Cholesterol: 136 mg/dL (ref 0–200)
HDL: 75 mg/dL (ref >40)
LDL CALC: 52 mg/dL (ref 0–99)
Total CHOL/HDL Ratio: 1.8 RATIO
Triglycerides: 47 mg/dL (ref <150)
VLDL: 9 mg/dL (ref 0–40)

## 2017-05-23 LAB — ECHOCARDIOGRAM COMPLETE
Height: 69 in
WEIGHTICAEL: 3160 [oz_av]

## 2017-05-23 LAB — TSH: TSH: 1.08 u[IU]/mL (ref 0.350–4.500)

## 2017-05-23 LAB — HIV ANTIBODY (ROUTINE TESTING W REFLEX): HIV Screen 4th Generation wRfx: NONREACTIVE

## 2017-05-23 LAB — TROPONIN I: Troponin I: <0.03 ng/mL (ref <0.03)

## 2017-05-23 NOTE — Progress Notes (Signed)
  Echocardiogram 2D Echocardiogram has been performed.  Kelly Tran Kelly Tran 05/23/2017, 9:46 AM

## 2017-05-23 NOTE — Consult Note (Signed)
CONSULTATION NOTE   Patient Name: Kelly Tran Date of Encounter: 05/23/2017 Cardiologist: None (NEW)  Chief Complaint   Chest pain  Patient Profile   51 yo female who presents with 2 months worth of chest pain.  She felt that her symptoms may be related to anxiety.  She saw her PCP and was started on Elavil.  Yesterday had left-sided, dull achy pain that radiated to her left arm and presented to the emergency department.  She was given nitroglycerin without relief.  HPI   Kelly Tran is a 52 y.o. female who is being seen today for the evaluation of chest pain at the request of Dr. Lonny Prude.  Kelly Tran has a history of hypothyroidism, migraine, allergies, GERD and anxiety.  She is reported several months of chest pain which she felt was anxiety related.  She saw her primary care provider 3 weeks ago and was restarted on Elavil for anxiety.  At first she felt better and then she started having pain again.  She described it as left-sided chest discomfort that radiated to the left arm.  It occurred while at rest.  She was given nitroglycerin in the emergency department without relief.  Overnight her troponins were negative.  An echocardiogram was performed and I personally reviewed that this morning which showed normal systolic and diastolic function, trivial mitral regurgitation, but otherwise was essentially normal.  I personally reviewed her EKG which shows no ischemic EKG changes.  PMHx   Past Medical History:  Diagnosis Date  . Allergy   . G E R D 02/22/2007  . Genital warts   . Hemorrhoids, internal 01/31/2011  . Hypothyroidism   . Migraine     Past Surgical History:  Procedure Laterality Date  . APPENDECTOMY    . DILATION AND CURETTAGE OF UTERUS    . miscarriage     x3--required d and c    FAMHx   Family History  Problem Relation Age of Onset  . Stroke Mother   . Arthritis Mother   . Hypertension Father   . Arthritis Father   . Hyperlipidemia Father     . Cancer Maternal Aunt        ovarian  . Arthritis Maternal Grandmother   . Arthritis Maternal Grandfather   . Arthritis Paternal Grandmother   . Arthritis Paternal Grandfather   . Cancer Maternal Aunt        Breast  . Cancer Maternal Aunt        Lung  . Diabetes Neg Hx        family    SOCHx    reports that she quit smoking about 21 years ago. she has never used smokeless tobacco. She reports that she drinks alcohol. She reports that she does not use drugs.  Outpatient Medications   No current facility-administered medications on file prior to encounter.    Current Outpatient Medications on File Prior to Encounter  Medication Sig Dispense Refill  . amitriptyline (ELAVIL) 25 MG tablet Take 1 tablet (25 mg total) by mouth at bedtime. (Patient taking differently: Take 25 mg at bedtime as needed by mouth (anxiety/increased stress). ) 90 tablet 1  . aspirin EC 81 MG tablet Take 81 mg daily by mouth.    Marland Kitchen CALCIUM PO Take 1 tablet daily with lunch by mouth.    . Cholecalciferol (VITAMIN D PO) Take 500 Units daily by mouth.    . Cyanocobalamin (VITAMIN B-12 PO) Take 1 tablet daily with lunch by mouth.    Marland Kitchen  cyclobenzaprine (FLEXERIL) 5 MG tablet Take 1 tablet (5 mg total) by mouth 3 (three) times daily as needed for muscle spasms. 20 tablet 0  . Fexofenadine HCl (ALLEGRA PO) Take 1 tablet daily as needed by mouth (seasonal allergies).    . fluticasone (FLONASE) 50 MCG/ACT nasal spray USE 2 SPRAYS INTO EACH NOSTRIL ONCE DAILY AS DIRECTED. (Patient taking differently: USE 2 SPRAYS INTO EACH NOSTRIL ONCE DAILY AS NEEDED FOR SEASONAL ALLERGIES) 16 g 2  . ibuprofen (ADVIL,MOTRIN) 200 MG tablet Take 800 mg daily as needed by mouth for headache.    . levothyroxine (SYNTHROID, LEVOTHROID) 50 MCG tablet 1 tablet daily and extra 1 tablet once a week (Patient taking differently: Take 50-100 mcg daily before breakfast by mouth. Take 2 tablets (100 mcg) by mouth on Wednesday mornings, take 1 tablet (50  mcg) on all other days of the week - before breakfast) 34 tablet 3  . liothyronine (CYTOMEL) 5 MCG tablet TAKE TWO TABLETS BY MOUTH DAILY (Patient taking differently: TAKE TWO TABLETS (10 MCG) BY MOUTH DAILY BEFORE BREAKFAST) 60 tablet 2  . Multiple Vitamin (MULTIVITAMIN WITH MINERALS) TABS tablet Take 1 tablet daily with lunch by mouth.    . naproxen sodium (ALEVE) 220 MG tablet Take 220 mg 2 (two) times daily as needed by mouth (pain/headache).    Vladimir Faster Glycol-Propyl Glycol (SYSTANE OP) Place 1 drop daily as needed into both eyes (dry eyes).    . Pyridoxine HCl (VITAMIN B-6 PO) Take 1 tablet daily with lunch by mouth.    . triamcinolone ointment (KENALOG) 0.5 % Apply 1 application topically 2 (two) times daily. (Patient not taking: Reported on 05/22/2017) 30 g 0    Inpatient Medications    Scheduled Meds: . aspirin EC  81 mg Oral Daily  . atorvastatin  80 mg Oral q1800  . enoxaparin (LOVENOX) injection  40 mg Subcutaneous Q24H  . [START ON 05/27/2017] levothyroxine  100 mcg Oral Once per day on Wed  . levothyroxine  50 mcg Oral Once per day on Sun Mon Tue Thu Fri Sat  . liothyronine  10 mcg Oral Daily  . vitamin B-6  25 mg Oral Daily    Continuous Infusions:   PRN Meds: acetaminophen **OR** acetaminophen, amitriptyline, artificial tears, cyclobenzaprine, nitroGLYCERIN   ALLERGIES   No Known Allergies  ROS   Pertinent items noted in HPI and remainder of comprehensive ROS otherwise negative.  Vitals   Vitals:   05/22/17 2010 05/23/17 0033 05/23/17 0637 05/23/17 1012  BP: 116/70 (!) 98/53 100/62 (!) 107/58  Pulse: 70 82 64 71  Resp:      Temp: 98.2 F (36.8 C) 98.1 F (36.7 C) 98 F (36.7 C) 98.1 F (36.7 C)  TempSrc: Oral Oral Oral Oral  SpO2: 100% 96% 95% 97%  Weight: 196 lb (88.9 kg)  197 lb 8 oz (89.6 kg)   Height: _0  (1.753 m)       Intake/Output Summary (Last 24 hours) at 05/23/2017 1146 Last data filed at 05/23/2017 0500 Gross per 24 hour  Intake  565 ml  Output -  Net 565 ml   Filed Weights   05/22/17 2010 05/23/17 0637  Weight: 196 lb (88.9 kg) 197 lb 8 oz (89.6 kg)    Physical Exam   General appearance: alert and no distress Neck: no carotid bruit, no JVD and thyroid not enlarged, symmetric, no tenderness/mass/nodules Lungs: clear to auscultation bilaterally Heart: regular rate and rhythm, S1, S2 normal, no murmur, click, rub  or gallop Abdomen: soft, non-tender; bowel sounds normal; no masses,  no organomegaly Extremities: extremities normal, atraumatic, no cyanosis or edema Pulses: 2+ and symmetric Skin: Skin color, texture, turgor normal. No rashes or lesions Neurologic: Grossly normal Psych: Pleasant  Labs   Results for orders placed or performed during the hospital encounter of 05/22/17 (from the past 48 hour(s))  Basic metabolic panel     Status: Abnormal   Collection Time: 05/22/17  3:05 PM  Result Value Ref Range   Sodium 138 135 - 145 mmol/L   Potassium 3.7 3.5 - 5.1 mmol/L   Chloride 106 101 - 111 mmol/L   CO2 25 22 - 32 mmol/L   Glucose, Bld 107 (H) 65 - 99 mg/dL   BUN 16 6 - 20 mg/dL   Creatinine, Ser 0.79 0.44 - 1.00 mg/dL   Calcium 9.6 8.9 - 10.3 mg/dL   GFR calc non Af Amer >60 >60 mL/min   GFR calc Af Amer >60 >60 mL/min    Comment: (NOTE) The eGFR has been calculated using the CKD EPI equation. This calculation has not been validated in all clinical situations. eGFR's persistently <60 mL/min signify possible Chronic Kidney Disease.    Anion gap 7 5 - 15  CBC     Status: Abnormal   Collection Time: 05/22/17  3:05 PM  Result Value Ref Range   WBC 11.6 (H) 4.0 - 10.5 K/uL   RBC 4.61 3.87 - 5.11 MIL/uL   Hemoglobin 13.7 12.0 - 15.0 g/dL   HCT 40.8 36.0 - 46.0 %   MCV 88.5 78.0 - 100.0 fL   MCH 29.7 26.0 - 34.0 pg   MCHC 33.6 30.0 - 36.0 g/dL   RDW 12.1 11.5 - 15.5 %   Platelets 302 150 - 400 K/uL  I-stat troponin, ED     Status: None   Collection Time: 05/22/17  3:16 PM  Result Value  Ref Range   Troponin i, poc 0.00 0.00 - 0.08 ng/mL   Comment 3            Comment: Due to the release kinetics of cTnI, a negative result within the first hours of the onset of symptoms does not rule out myocardial infarction with certainty. If myocardial infarction is still suspected, repeat the test at appropriate intervals.   Urinalysis, Routine w reflex microscopic     Status: Abnormal   Collection Time: 05/22/17  3:36 PM  Result Value Ref Range   Color, Urine AMBER (A) YELLOW    Comment: BIOCHEMICALS MAY BE AFFECTED BY COLOR   APPearance HAZY (A) CLEAR   Specific Gravity, Urine 1.031 (H) 1.005 - 1.030   pH 5.0 5.0 - 8.0   Glucose, UA NEGATIVE NEGATIVE mg/dL   Hgb urine dipstick NEGATIVE NEGATIVE   Bilirubin Urine SMALL (A) NEGATIVE   Ketones, ur 5 (A) NEGATIVE mg/dL   Protein, ur 30 (A) NEGATIVE mg/dL   Nitrite NEGATIVE NEGATIVE   Leukocytes, UA NEGATIVE NEGATIVE   RBC / HPF 0-5 0 - 5 RBC/hpf   WBC, UA 0-5 0 - 5 WBC/hpf   Bacteria, UA RARE (A) NONE SEEN   Squamous Epithelial / LPF NONE SEEN NONE SEEN   Mucus PRESENT    Hyaline Casts, UA PRESENT    Ca Oxalate Crys, UA PRESENT   CBC     Status: Abnormal   Collection Time: 05/22/17  9:25 PM  Result Value Ref Range   WBC 11.1 (H) 4.0 - 10.5 K/uL   RBC  4.31 3.87 - 5.11 MIL/uL   Hemoglobin 12.8 12.0 - 15.0 g/dL   HCT 38.0 36.0 - 46.0 %   MCV 88.2 78.0 - 100.0 fL   MCH 29.7 26.0 - 34.0 pg   MCHC 33.7 30.0 - 36.0 g/dL   RDW 12.1 11.5 - 15.5 %   Platelets 306 150 - 400 K/uL  Troponin I     Status: None   Collection Time: 05/22/17  9:25 PM  Result Value Ref Range   Troponin I <0.03 <0.03 ng/mL  TSH     Status: None   Collection Time: 05/23/17  2:22 AM  Result Value Ref Range   TSH 1.080 0.350 - 4.500 uIU/mL    Comment: Performed by a 3rd Generation assay with a functional sensitivity of <=0.01 uIU/mL.  Comprehensive metabolic panel     Status: Abnormal   Collection Time: 05/23/17  2:22 AM  Result Value Ref Range    Sodium 138 135 - 145 mmol/L   Potassium 3.6 3.5 - 5.1 mmol/L   Chloride 107 101 - 111 mmol/L   CO2 24 22 - 32 mmol/L   Glucose, Bld 134 (H) 65 - 99 mg/dL   BUN 13 6 - 20 mg/dL   Creatinine, Ser 0.63 0.44 - 1.00 mg/dL   Calcium 8.7 (L) 8.9 - 10.3 mg/dL   Total Protein 5.7 (L) 6.5 - 8.1 g/dL   Albumin 3.4 (L) 3.5 - 5.0 g/dL   AST 19 15 - 41 U/L   ALT 21 14 - 54 U/L   Alkaline Phosphatase 80 38 - 126 U/L   Total Bilirubin 0.9 0.3 - 1.2 mg/dL   GFR calc non Af Amer >60 >60 mL/min   GFR calc Af Amer >60 >60 mL/min    Comment: (NOTE) The eGFR has been calculated using the CKD EPI equation. This calculation has not been validated in all clinical situations. eGFR's persistently <60 mL/min signify possible Chronic Kidney Disease.    Anion gap 7 5 - 15  Troponin I     Status: None   Collection Time: 05/23/17  2:22 AM  Result Value Ref Range   Troponin I <0.03 <0.03 ng/mL  Lipid panel     Status: None   Collection Time: 05/23/17  2:22 AM  Result Value Ref Range   Cholesterol 136 0 - 200 mg/dL   Triglycerides 47 <150 mg/dL   HDL 75 >40 mg/dL   Total CHOL/HDL Ratio 1.8 RATIO   VLDL 9 0 - 40 mg/dL   LDL Cholesterol 52 0 - 99 mg/dL    Comment:        Total Cholesterol/HDL:CHD Risk Coronary Heart Disease Risk Table                     Men   Women  1/2 Average Risk   3.4   3.3  Average Risk       5.0   4.4  2 X Average Risk   9.6   7.1  3 X Average Risk  23.4   11.0        Use the calculated Patient Ratio above and the CHD Risk Table to determine the patient's CHD Risk.        ATP III CLASSIFICATION (LDL):  <100     mg/dL   Optimal  100-129  mg/dL   Near or Above                    Optimal  130-159  mg/dL   Borderline  160-189  mg/dL   High  >190     mg/dL   Very High     ECG   NSR at 76 - Personally Reviewed  Telemetry   Sinus rhythm - Personally Reviewed  Radiology   Dg Chest Portable 1 View  Result Date: 05/22/2017 CLINICAL DATA:  Left-sided chest pain EXAM:  PORTABLE CHEST 1 VIEW COMPARISON:  01/15/2015 FINDINGS: The heart size and mediastinal contours are within normal limits. Both lungs are clear. The visualized skeletal structures are unremarkable. IMPRESSION: No active disease. Electronically Signed   By: Donavan Foil M.D.   On: 05/22/2017 15:46    Cardiac Studies   LV EF: 55% -   60%  ------------------------------------------------------------------- History:   PMH:  chest pain.  ------------------------------------------------------------------- Study Conclusions  - Left ventricle: The cavity size was normal. Wall thickness was   normal. Systolic function was normal. The estimated ejection   fraction was in the range of 55% to 60%. Wall motion was normal;   there were no regional wall motion abnormalities. Left   ventricular diastolic function parameters were normal. - Mitral valve: Mildly thickened leaflets . There was trivial   regurgitation. - Left atrium: The atrium was normal in size. - Tricuspid valve: There was trivial regurgitation. - Pulmonary arteries: PA peak pressure: 25 mm Hg (S). - Inferior vena cava: The vessel was normal in size. The   respirophasic diameter changes were in the normal range (= 50%),   consistent with normal central venous pressure.  Impressions:  - Essentially normal study.  Impression   1. Principal Problem: 2.   Chest pain 3. Active Problems: 4.   Hypothyroidism 5.   GERD (gastroesophageal reflux disease) 6.   Hyperglycemia 7.   Recommendation   1. Atypical presentation for cardiac chest pain. Ruled-out for MI - suspect anxiety/stress a big factor. Few cardiac risk factors and little family history. Echo very reassuring. Can be discharged today from a cardiac standpoint. She can follow-up with me PRN.  Time Spent Directly with Patient:  I have spent a total of 35 minutes with the patient reviewing hospital notes, telemetry, EKGs, labs and examining the patient as well as  establishing an assessment and plan that was discussed personally with the patient. > 50% of time was spent in direct patient care.  Length of Stay:  LOS: 0 days   Pixie Casino, MD, [facility], [place] Director of the [facility] Attending Cardiologist  Direct Dial: [phone]  Fax: [phone]  Website:  [email or web address] [email or web address] Colbaugh 05/23/2017, 11:46 AM

## 2017-05-23 NOTE — Discharge Summary (Signed)
Physician Discharge Summary  Kelly RocheMelinda M Tran ZOX:[id number] DOB: 05/30/1966 DOA: 05/22/2017  PCP: Kelly Tran, Kelly Tran  Admit date: 05/22/2017 Discharge date: 05/23/2017  Admitted From: Home Disposition: Home  Recommendations for Outpatient Follow-up:  1. Follow up with PCP in 1 week 2. Please follow up on the following pending results: None  Home Health: None Equipment/Devices: None  Discharge Condition: Stable CODE STATUS: Full code Diet recommendation: Regular diet   Brief/Interim Summary:  Admission HPI written by Kelly GrippeJames Kim, Kelly Tran    HPI:   Kelly Tran  is a 51 y.o. female, Tran c/o chest pain for the past 2 months.  Felt like it was anxiety related.  wentto pcp 3 weeks ago.  And went back to elavil for anxiety.  Today she was fineandthen at work it started again.  Pain left sided dull achy with radiation to the left arm.  Started about 1pm.  Was standing at her desk.  Denies fever, chills, cough,sob,  palp, n/v, diarrhea, brbpr, black stool.  Pt given slg nitro in the ED without relief.    In ED,   CXR no active disease ekg nsr at 75, nl axis, no st-t changes c/Tran ischemia.  Trop negative  Wbc 11.6, Hgb 13.7, Plt 302 Glucose 107, Bun 16, Creatinne 0.79  Pt will be admitted for chest pain r/o   Hospital course:  Chest pain Atypical. Troponin negative. LDL of 52. Echocardiogram normal. Cardiology consulted and recommended outpatient follow-up as needed. D-dimer of 0.31. Continued workup as an outpatient.  Hypotension Improved with IV fluids. Asymptomatic.  Hyperglycemia Not fasting.   Hypothyroidism TSH of 1.080. Continue home regimen.  Anxiety Continued Elavil.  Discharge Diagnoses:  Principal Problem:   Chest pain Active Problems:   Hypothyroidism   GERD (gastroesophageal reflux disease)   Hyperglycemia    Discharge Instructions  Discharge Instructions    Call Kelly Tran for:  extreme fatigue   Complete by:  As directed    Call Kelly Tran for:   persistant dizziness or light-headedness   Complete by:  As directed    Call Kelly Tran for:  persistant nausea and vomiting   Complete by:  As directed    Call Kelly Tran for:  severe uncontrolled pain   Complete by:  As directed    Diet - low sodium heart healthy   Complete by:  As directed    Increase activity slowly   Complete by:  As directed      Allergies as of 05/23/2017   No Known Allergies     Medication List    STOP taking these medications   ALEVE 220 MG tablet Generic drug:  naproxen sodium   ibuprofen 200 MG tablet Commonly known as:  ADVIL,MOTRIN   triamcinolone ointment 0.5 % Commonly known as:  KENALOG     TAKE these medications   ALLEGRA PO Take 1 tablet daily as needed by mouth (seasonal allergies).   amitriptyline 25 MG tablet Commonly known as:  ELAVIL Take 1 tablet (25 mg total) by mouth at bedtime. What changed:    when to take this  reasons to take this   aspirin EC 81 MG tablet Take 81 mg daily by mouth.   CALCIUM PO Take 1 tablet daily with lunch by mouth.   cyclobenzaprine 5 MG tablet Commonly known as:  FLEXERIL Take 1 tablet (5 mg total) by mouth 3 (three) times daily as needed for muscle spasms.   fluticasone 50 MCG/ACT nasal spray Commonly known as:  FLONASE USE 2 SPRAYS  INTO EACH NOSTRIL ONCE DAILY AS DIRECTED. What changed:  See the new instructions.   levothyroxine 50 MCG tablet Commonly known as:  SYNTHROID, LEVOTHROID 1 tablet daily and extra 1 tablet once a week What changed:    how much to take  how to take this  when to take this  additional instructions   liothyronine 5 MCG tablet Commonly known as:  CYTOMEL TAKE TWO TABLETS BY MOUTH DAILY What changed:    how much to take  how to take this  when to take this   multivitamin with minerals Tabs tablet Take 1 tablet daily with lunch by mouth.   SYSTANE OP Place 1 drop daily as needed into both eyes (dry eyes).   VITAMIN B-12 PO Take 1 tablet daily with lunch  by mouth.   VITAMIN B-6 PO Take 1 tablet daily with lunch by mouth.   VITAMIN D PO Take 500 Units daily by mouth.      Follow-up Information    Kelly Munroe, Tran. Schedule an appointment as soon as possible for a visit in 1 week(s).   Specialty:  Internal Medicine Contact information: [address] [place] [address] [phone]        Kelly Nose, Kelly Tran. Schedule an appointment as soon as possible for a visit.   Specialty:  Cardiology Why:  As needed Contact information: [address] [place] [address] [phone]          No Known Allergies  Consultations:  Cardiology   Procedures/Studies: Dg Chest Portable 1 View  Result Date: 05/22/2017 CLINICAL DATA:  Left-sided chest pain EXAM: PORTABLE CHEST 1 VIEW COMPARISON:  01/15/2015 FINDINGS: The heart size and mediastinal contours are within normal limits. Both lungs are clear. The visualized skeletal structures are unremarkable. IMPRESSION: No active disease. Electronically Signed   By: Kelly Tran M.D.   On: 05/22/2017 15:46   Dg Foot Complete Left  Result Date: 05/07/2017 Please see detailed radiograph report in office note.  Dg Foot Complete Right  Result Date: 05/07/2017 Please see detailed radiograph report in office note.    Cardiac Studies   LV EF: 55% - 60%  ------------------------------------------------------------------- History: PMH: chest pain.  ------------------------------------------------------------------- Study Conclusions  - Left ventricle: The cavity size was normal. Wall thickness was normal. Systolic function was normal. The estimated ejection fraction was in the range of 55% to 60%. Wall motion was normal; there were no regional wall motion abnormalities. Left ventricular diastolic function parameters were normal. - Mitral valve: Mildly thickened leaflets . There was trivial regurgitation. - Left atrium: The atrium  was normal in size. - Tricuspid valve: There was trivial regurgitation. - Pulmonary arteries: PA peak pressure: 25 mm Hg (S). - Inferior vena cava: The vessel was normal in size. The respirophasic diameter changes were in the normal range (= 50%), consistent with normal central venous pressure.  Impressions:  - Essentially normal study.     Subjective: Chest pain improved.   Discharge Exam: Vitals:   05/23/17 1012 05/23/17 1407  BP: (!) 107/58 (!) 105/56  Pulse: 71 74  Resp:    Temp: 98.1 F (36.7 C) 98.5 F (36.9 C)  SpO2: 97% 96%   Vitals:   05/23/17 0033 05/23/17 0637 05/23/17 1012 05/23/17 1407  BP: (!) 98/53 100/62 (!) 107/58 (!) 105/56  Pulse: 82 64 71 74  Resp:      Temp: 98.1 F (36.7 C) 98 F (36.7 C) 98.1 F (36.7 C) 98.5  F (36.9 C)  TempSrc: Oral Oral Oral Oral  SpO2: 96% 95% 97% 96%  Weight:  89.6 kg (197 lb 8 oz)    Height:        General: Pt is alert, awake, not in acute distress Cardiovascular: RRR, S1/S2 +, no rubs, no gallops Respiratory: CTA bilaterally, no wheezing, no rhonchi Abdominal: Soft, NT, ND, bowel sounds + Musculoskeletal: no edema, no cyanosis, chest pain on palpation    The results of significant diagnostics from this hospitalization (including imaging, microbiology, ancillary and laboratory) are listed below for reference.     Microbiology: No results found for this or any previous visit (from the past 240 hour(s)).   Labs: BNP (last 3 results) No results for input(s): BNP in the last 8760 hours. Basic Metabolic Panel: Recent Labs  Lab 05/22/17 1505 05/23/17 0222  NA 138 138  K 3.7 3.6  CL 106 107  CO2 25 24  GLUCOSE 107* 134*  BUN 16 13  CREATININE 0.79 0.63  CALCIUM 9.6 8.7*   Liver Function Tests: Recent Labs  Lab 05/23/17 0222  AST 19  ALT 21  ALKPHOS 80  BILITOT 0.9  PROT 5.7*  ALBUMIN 3.4*   No results for input(s): LIPASE, AMYLASE in the last 168 hours. No results for input(s): AMMONIA  in the last 168 hours. CBC: Recent Labs  Lab 05/22/17 1505 05/22/17 2125  WBC 11.6* 11.1*  HGB 13.7 12.8  HCT 40.8 38.0  MCV 88.5 88.2  PLT 302 306   Cardiac Enzymes: Recent Labs  Lab 05/22/17 2125 05/23/17 0222  TROPONINI <0.03 <0.03   BNP: Invalid input(s): POCBNP CBG: No results for input(s): GLUCAP in the last 168 hours. D-Dimer No results for input(s): DDIMER in the last 72 hours. Hgb A1c No results for input(s): HGBA1C in the last 72 hours. Lipid Profile Recent Labs    05/23/17 0222  CHOL 136  HDL 75  LDLCALC 52  TRIG 47  CHOLHDL 1.8   Thyroid function studies Recent Labs    05/23/17 0222  TSH 1.080   Anemia work up No results for input(s): VITAMINB12, FOLATE, FERRITIN, TIBC, IRON, RETICCTPCT in the last 72 hours. Urinalysis    Component Value Date/Time   COLORURINE AMBER (A) 05/22/2017 1536   APPEARANCEUR HAZY (A) 05/22/2017 1536   LABSPEC 1.031 (H) 05/22/2017 1536   PHURINE 5.0 05/22/2017 1536   GLUCOSEU NEGATIVE 05/22/2017 1536   GLUCOSEU NEGATIVE 04/04/2009 0752   HGBUR NEGATIVE 05/22/2017 1536   HGBUR negative 02/16/2007 1032   BILIRUBINUR SMALL (A) 05/22/2017 1536   BILIRUBINUR neg 09/10/2015 1620   KETONESUR 5 (A) 05/22/2017 1536   PROTEINUR 30 (A) 05/22/2017 1536   UROBILINOGEN negative 09/10/2015 1620   UROBILINOGEN 0.2 04/04/2009 0752   NITRITE NEGATIVE 05/22/2017 1536   LEUKOCYTESUR NEGATIVE 05/22/2017 1536     SIGNED:   Jacquelin Hawkingalph Kanyah Matsushima, Kelly Tran [facility] 05/23/2017, 2:38 PM Pager [phone]  If 7PM-7AM, please contact night-coverage [email or web address] Password [email or web address]

## 2017-05-23 NOTE — Discharge Instructions (Addendum)
Nonspecific Chest Pain °Chest pain can be caused by many different conditions. There is always a chance that your pain could be related to something serious, such as a heart attack or a blood clot in your lungs. Chest pain can also be caused by conditions that are not life-threatening. If you have chest pain, it is very important to follow up with your health care provider. °What are the causes? °Causes of this condition include: °· Heartburn. °· Pneumonia or bronchitis. °· Anxiety or stress. °· Inflammation around your heart (pericarditis) or lung (pleuritis or pleurisy). °· A blood clot in your lung. °· A collapsed lung (pneumothorax). This can develop suddenly on its own (spontaneous pneumothorax) or from trauma to the chest. °· Shingles infection (varicella-zoster virus). °· Heart attack. °· Damage to the bones, muscles, and cartilage that make up your chest wall. This can include: °? Bruised bones due to injury. °? Strained muscles or cartilage due to frequent or repeated coughing or overwork. °? Fracture to one or more ribs. °? Sore cartilage due to inflammation (costochondritis). ° °What increases the risk? °Risk factors for this condition may include: °· Activities that increase your risk for trauma or injury to your chest. °· Respiratory infections or conditions that cause frequent coughing. °· Medical conditions or overeating that can cause heartburn. °· Heart disease or family history of heart disease. °· Conditions or health behaviors that increase your risk of developing a blood clot. °· Having had chicken pox (varicella zoster). ° °What are the signs or symptoms? °Chest pain can feel like: °· Burning or tingling on the surface of your chest or deep in your chest. °· Crushing, pressure, aching, or squeezing pain. °· Dull or sharp pain that is worse when you move, cough, or take a deep breath. °· Pain that is also felt in your back, neck, shoulder, or arm, or pain that spreads to any of these  areas. ° °Your chest pain may come and go, or it may stay constant. °How is this diagnosed? °Lab tests or other studies may be needed to find the cause of your pain. Your health care provider may have you take a test called an ECG (electrocardiogram). An ECG records your heartbeat patterns at the time the test is performed. You may also have other tests, such as: °· Transthoracic echocardiogram (TTE). In this test, sound waves are used to create a picture of the heart structures and to look at how blood flows through your heart. °· Transesophageal echocardiogram (TEE). This is a more advanced imaging test that takes images from inside your body. It allows your health care provider to see your heart in finer detail. °· Cardiac monitoring. This allows your health care provider to monitor your heart rate and rhythm in real time. °· Holter monitor. This is a portable device that records your heartbeat and can help to diagnose abnormal heartbeats. It allows your health care provider to track your heart activity for several days, if needed. °· Stress tests. These can be done through exercise or by taking medicine that makes your heart beat more quickly. °· Blood tests. °· Other imaging tests. ° °How is this treated? °Treatment depends on what is causing your chest pain. Treatment may include: °· Medicines. These may include: °? Acid blockers for heartburn. °? Anti-inflammatory medicine. °? Pain medicine for inflammatory conditions. °? Antibiotic medicine, if an infection is present. °? Medicines to dissolve blood clots. °? Medicines to treat coronary artery disease (CAD). °· Supportive care for conditions that   do not require medicines. This may include: ? Resting. ? Applying heat or cold packs to injured areas. ? Limiting activities until pain decreases.  Follow these instructions at home: Medicines  If you were prescribed an antibiotic, take it as told by your health care provider. Do not stop taking the  antibiotic even if you start to feel better.  Take over-the-counter and prescription medicines only as told by your health care provider. Lifestyle  Do not use any products that contain nicotine or tobacco, such as cigarettes and e-cigarettes. If you need help quitting, ask your health care provider.  Do not drink alcohol.  Make lifestyle changes as directed by your health care provider. These may include: ? Getting regular exercise. Ask your health care provider to suggest some activities that are safe for you. ? Eating a heart-healthy diet. A registered dietitian can help you to learn healthy eating options. ? Maintaining a healthy weight. ? Managing diabetes, if necessary. ? Reducing stress, such as with yoga or relaxation techniques. General instructions  Avoid any activities that bring on chest pain.  If heartburn is the cause for your chest pain, raise (elevate) the head of your bed about 6 inches (15 cm) by putting blocks under the legs. Sleeping with more pillows does not effectively relieve heartburn because it only changes the position of your head.  Keep all follow-up visits as told by your health care provider. This is important. This includes any further testing if your chest pain does not go away. Contact a health care provider if:  Your chest pain does not go away.  You have a rash with blisters on your chest.  You have a fever.  You have chills. Get help right away if:  Your chest pain is worse.  You have a cough that gets worse, or you cough up blood.  You have severe pain in your abdomen.  You have severe weakness.  You faint.  You have sudden, unexplained chest discomfort.  You have sudden, unexplained discomfort in your arms, back, neck, or jaw.  You have shortness of breath at any time.  You suddenly start to sweat, or your skin gets clammy.  You feel nauseous or you vomit.  You suddenly feel light-headed or dizzy.  Your heart begins to beat  quickly, or it feels like it is skipping beats. These symptoms may represent a serious problem that is an emergency. Do not wait to see if the symptoms will go away. Get medical help right away. Call your local emergency services (911 in the [place] Do not drive yourself to the hospital. This information is not intended to replace advice given to you by your health care provider. Make sure you discuss any questions you have with your health care provider. Document Released: 04/02/2005 Document Revised: 03/17/2016 Document Reviewed: 03/17/2016 Elsevier Interactive Patient Education  2017 Elsevier Inc.   Heart-Healthy Eating Plan Heart-healthy meal planning includes:  Limiting unhealthy fats.  Increasing healthy fats.  Making other small dietary changes.  You may need to talk with your doctor or a diet specialist (dietitian) to create an eating plan that is right for you. What types of fat should I choose?  Choose healthy fats. These include olive oil and canola oil, flaxseeds, walnuts, almonds, and seeds.  Eat more omega-3 fats. These include salmon, mackerel, sardines, tuna, flaxseed oil, and ground flaxseeds. Try to eat fish at least twice each week.  Limit saturated fats. ? Saturated fats are often found in animal products,  such as meats, butter, and cream. ? Plant sources of saturated fats include palm oil, palm kernel oil, and coconut oil.  Avoid foods with partially hydrogenated oils in them. These include stick margarine, some tub margarines, cookies, crackers, and other baked goods. These contain trans fats. What general guidelines do I need to follow?  Check food labels carefully. Identify foods with trans fats or high amounts of saturated fat.  Fill one half of your plate with vegetables and green salads. Eat 4-5 servings of vegetables per day. A serving of vegetables is: ? 1 cup of raw leafy vegetables. ?  cup of raw or cooked cut-up vegetables. ?  cup of vegetable  juice.  Fill one fourth of your plate with whole grains. Look for the word "whole" as the first word in the ingredient list.  Fill one fourth of your plate with lean protein foods.  Eat 4-5 servings of fruit per day. A serving of fruit is: ? One medium whole fruit. ?  cup of dried fruit. ?  cup of fresh, frozen, or canned fruit. ?  cup of 100% fruit juice.  Eat more foods that contain soluble fiber. These include apples, broccoli, carrots, beans, peas, and barley. Try to get 20-30 g of fiber per day.  Eat more home-cooked food. Eat less restaurant, buffet, and fast food.  Limit or avoid alcohol.  Limit foods high in starch and sugar.  Avoid fried foods.  Avoid frying your food. Try baking, boiling, grilling, or broiling it instead. You can also reduce fat by: ? Removing the skin from poultry. ? Removing all visible fats from meats. ? Skimming the fat off of stews, soups, and gravies before serving them. ? Steaming vegetables in water or broth.  Lose weight if you are overweight.  Eat 4-5 servings of nuts, legumes, and seeds per week: ? One serving of dried beans or legumes equals  cup after being cooked. ? One serving of nuts equals 1 ounces. ? One serving of seeds equals  ounce or one tablespoon.  You may need to keep track of how much salt or sodium you eat. This is especially true if you have high blood pressure. Talk with your doctor or dietitian to get more information. What foods can I eat? Grains Breads, including [place], white, pita, wheat, raisin, rye, oatmeal, and [place]. Tortillas that are neither fried nor made with lard or trans fat. Low-fat rolls, including hotdog and hamburger buns and English muffins. Biscuits. Muffins. Waffles. Pancakes. Light popcorn. Whole-grain cereals. Flatbread. Melba toast. Pretzels. Breadsticks. Rusks. Low-fat snacks. Low-fat crackers, including oyster, saltine, matzo, graham, animal, and rye. Rice and pasta, including brown rice  and pastas that are made with whole wheat. Vegetables All vegetables. Fruits All fruits, but limit coconut. Meats and Other Protein Sources Lean, well-trimmed beef, veal, pork, and lamb. Chicken and [place] without skin. All fish and shellfish. Wild duck, rabbit, pheasant, and venison. Egg whites or low-cholesterol egg substitutes. Dried beans, peas, lentils, and tofu. Seeds and most nuts. Dairy Low-fat or nonfat cheeses, including ricotta, string, and mozzarella. Skim or 1% milk that is liquid, powdered, or evaporated. Buttermilk that is made with low-fat milk. Nonfat or low-fat yogurt. Beverages Mineral water. Diet carbonated beverages. Sweets and Desserts Sherbets and fruit ices. Honey, jam, marmalade, jelly, and syrups. Meringues and gelatins. Pure sugar candy, such as hard candy, jelly beans, gumdrops, mints, marshmallows, and small amounts of dark chocolate. [employer]. Eat all sweets and desserts in moderation. Fats and  Oils Nonhydrogenated (trans-free) margarines. Vegetable oils, including soybean, sesame, sunflower, olive, peanut, safflower, corn, canola, and cottonseed. Salad dressings or mayonnaise made with a vegetable oil. Limit added fats and oils that you use for cooking, baking, salads, and as spreads. Other Cocoa powder. Coffee and tea. All seasonings and condiments. The items listed above may not be a complete list of recommended foods or beverages. Contact your dietitian for more options. What foods are not recommended? Grains Breads that are made with saturated or trans fats, oils, or whole milk. Croissants. Butter rolls. Cheese breads. Sweet rolls. Donuts. Buttered popcorn. Chow mein noodles. High-fat crackers, such as cheese or butter crackers. Meats and Other Protein Sources Fatty meats, such as hotdogs, short ribs, sausage, spareribs, bacon, rib eye roast or steak, and mutton. High-fat deli meats, such as salami and bologna. Caviar. Domestic duck and goose. Organ  meats, such as kidney, liver, sweetbreads, and heart. Dairy Cream, sour cream, cream cheese, and creamed cottage cheese. Whole-milk cheeses, including blue (bleu), [address], [place], [place], [address], [place], [address], cheddar, [place], and [place]. Whole or 2% milk that is liquid, evaporated, or condensed. Whole buttermilk. Cream sauce or high-fat cheese sauce. Yogurt that is made from whole milk. Beverages Regular sodas and juice drinks with added sugar. Sweets and Desserts Frosting. Pudding. Cookies. Cakes other than angel food cake. Candy that has milk chocolate or white chocolate, hydrogenated fat, butter, coconut, or unknown ingredients. Buttered syrups. Full-fat ice cream or ice cream drinks. Fats and Oils Gravy that has suet, meat fat, or shortening. Cocoa butter, hydrogenated oils, palm oil, coconut oil, palm kernel oil. These can often be found in baked products, candy, fried foods, nondairy creamers, and whipped toppings. Solid fats and shortenings, including bacon fat, salt pork, lard, and butter. Nondairy cream substitutes, such as coffee creamers and sour cream substitutes. Salad dressings that are made of unknown oils, cheese, or sour cream. The items listed above may not be a complete list of foods and beverages to avoid. Contact your dietitian for more information. This information is not intended to replace advice given to you by your health care provider. Make sure you discuss any questions you have with your health care provider. Document Released: 12/23/2011 Document Revised: 11/29/2015 Document Reviewed: 12/15/2013 Elsevier Interactive Patient Education  [employer]2018 Elsevier Inc.

## 2017-05-25 ENCOUNTER — Telehealth: Payer: Self-pay | Admitting: *Deleted

## 2017-05-25 NOTE — Telephone Encounter (Signed)
Lm requesting return call to complete TCM and confirm hosp f/u appt  

## 2017-06-01 ENCOUNTER — Other Ambulatory Visit: Payer: 59 | Admitting: Orthotics

## 2017-06-02 ENCOUNTER — Encounter: Payer: Self-pay | Admitting: Internal Medicine

## 2017-06-02 ENCOUNTER — Ambulatory Visit: Payer: 59 | Admitting: Internal Medicine

## 2017-06-02 VITALS — BP 120/78 | HR 77 | Temp 97.9°F | Wt 202.0 lb

## 2017-06-02 DIAGNOSIS — F419 Anxiety disorder, unspecified: Secondary | ICD-10-CM

## 2017-06-02 DIAGNOSIS — R0789 Other chest pain: Secondary | ICD-10-CM | POA: Diagnosis not present

## 2017-06-02 DIAGNOSIS — K219 Gastro-esophageal reflux disease without esophagitis: Secondary | ICD-10-CM | POA: Diagnosis not present

## 2017-06-02 DIAGNOSIS — Z566 Other physical and mental strain related to work: Secondary | ICD-10-CM | POA: Diagnosis not present

## 2017-06-02 MED ORDER — OMEPRAZOLE 20 MG PO CPDR: 20.0000 mg | DELAYED_RELEASE_CAPSULE | Freq: Every day | ORAL | 2 refills | Status: DC

## 2017-06-02 NOTE — Progress Notes (Signed)
Subjective:    Patient ID: Kelly RocheMelinda M Tran, female    DOB: 11/09/1965, 51 y.o.   MRN: [id number]  HPI  Pt presents to the clinic today for hospital follow up. She went to the ER 11/16 with c/o chest pain that had started 2 months prior. She described the chest pain as dull and achy, with radiation to her left arm. She received Nitro in the ED without improvement. ECG showed no acute findings concerning for ACS. Chest xray was negative. Troponin's were negative. Echo was normal. She had felt like her chest pain may have been anxiety related, was started on Elavil 05/04/17 by Deboraha Sprangebbie Gessner, NP. Since discharge, she reports she continues to have intermittent left side pressure, palpitations and reflux symptoms. She feels confident that the stress/anxiety from working 70 + hours a week is causing her symptoms. She has not made her follow up appt with cardiology. She reports that although she was started on the Elavil, she has not been taking it daily. She reports her reflux symptoms persist despite the Ranitidine. She does not attribute the reflux to things she is eating, but rather stress.  Review of Systems      Past Medical History:  Diagnosis Date  . Allergy   . G E R D 02/22/2007  . Genital warts   . Hemorrhoids, internal 01/31/2011  . Hypothyroidism   . Migraine     Current Outpatient Medications  Medication Sig Dispense Refill  . amitriptyline (ELAVIL) 25 MG tablet Take 1 tablet (25 mg total) by mouth at bedtime. (Patient taking differently: Take 25 mg at bedtime as needed by mouth (anxiety/increased stress). ) 90 tablet 1  . aspirin EC 81 MG tablet Take 81 mg daily by mouth.    Marland Kitchen. CALCIUM PO Take 1 tablet daily with lunch by mouth.    . Cholecalciferol (VITAMIN D PO) Take 500 Units daily by mouth.    . Cyanocobalamin (VITAMIN B-12 PO) Take 1 tablet daily with lunch by mouth.    . cyclobenzaprine (FLEXERIL) 5 MG tablet Take 1 tablet (5 mg total) by mouth 3 (three) times daily as  needed for muscle spasms. 20 tablet 0  . Fexofenadine HCl (ALLEGRA PO) Take 1 tablet daily as needed by mouth (seasonal allergies).    . fluticasone (FLONASE) 50 MCG/ACT nasal spray USE 2 SPRAYS INTO EACH NOSTRIL ONCE DAILY AS DIRECTED. (Patient taking differently: USE 2 SPRAYS INTO EACH NOSTRIL ONCE DAILY AS NEEDED FOR SEASONAL ALLERGIES) 16 g 2  . levothyroxine (SYNTHROID, LEVOTHROID) 50 MCG tablet 1 tablet daily and extra 1 tablet once a week (Patient taking differently: Take 50-100 mcg daily before breakfast by mouth. Take 2 tablets (100 mcg) by mouth on Wednesday mornings, take 1 tablet (50 mcg) on all other days of the week - before breakfast) 34 tablet 3  . liothyronine (CYTOMEL) 5 MCG tablet TAKE TWO TABLETS BY MOUTH DAILY (Patient taking differently: TAKE TWO TABLETS (10 MCG) BY MOUTH DAILY BEFORE BREAKFAST) 60 tablet 2  . Multiple Vitamin (MULTIVITAMIN WITH MINERALS) TABS tablet Take 1 tablet daily with lunch by mouth.    Bertram Gala. Polyethyl Glycol-Propyl Glycol (SYSTANE OP) Place 1 drop daily as needed into both eyes (dry eyes).    . Pyridoxine HCl (VITAMIN B-6 PO) Take 1 tablet daily with lunch by mouth.     No current facility-administered medications for this visit.     No Known Allergies  Family History  Problem Relation Age of Onset  . Stroke Mother   .  Arthritis Mother   . Hypertension Father   . Arthritis Father   . Hyperlipidemia Father   . Cancer Maternal Aunt        ovarian  . Arthritis Maternal Grandmother   . Arthritis Maternal Grandfather   . Arthritis Paternal Grandmother   . Arthritis Paternal Grandfather   . Cancer Maternal Aunt        Breast  . Cancer Maternal Aunt        Lung  . Diabetes Neg Hx        family    Social History   Socioeconomic History  . Marital status: Married    Spouse name: Not on file  . Number of children: Not on file  . Years of education: Not on file  . Highest education level: Not on file  Social Needs  . Financial resource  strain: Not on file  . Food insecurity - worry: Not on file  . Food insecurity - inability: Not on file  . Transportation needs - medical: Not on file  . Transportation needs - non-medical: Not on file  Occupational History  . Not on file  Tobacco Use  . Smoking status: Former Smoker    Last attempt to quit: 01/31/1996    Years since quitting: 21.3  . Smokeless tobacco: Never Used  Substance and Sexual Activity  . Alcohol use: Yes    Alcohol/week: 0.0 oz    Comment: occ  . Drug use: No  . Sexual activity: Yes  Other Topics Concern  . Not on file  Social History Narrative  . Not on file     Constitutional: Denies fever, malaise, fatigue, headache or abrupt weight changes.  HEENT: Denies eye pain, eye redness, ear pain, ringing in the ears, wax buildup, runny nose, nasal congestion, bloody nose, or sore throat. Respiratory: Denies difficulty breathing, shortness of breath, cough or sputum production.   Cardiovascular: Pt reports chest pressure and palpitations. Denies chest pain, chest tightness, or swelling in the hands or feet.  Gastrointestinal: Pt reports reflux. Denies abdominal pain, bloating, constipation, diarrhea or blood in the stool.  Neurological: Denies dizziness, difficulty with memory, difficulty with speech or problems with balance and coordination.  Psych: Pt reports anxiety. Denies depression, SI/HI.  No other specific complaints in a complete review of systems (except as listed in HPI above).  Objective:   Physical Exam  BP 120/78   Pulse 77   Temp 97.9 F (36.6 C) (Oral)   Wt 202 lb (91.6 kg)   LMP 03/11/2013   SpO2 97%   BMI 29.83 kg/m  Wt Readings from Last 3 Encounters:  06/02/17 202 lb (91.6 kg)  05/23/17 197 lb 8 oz (89.6 kg)  05/04/17 206 lb (93.4 kg)    General: Appears her stated age, well developed, well nourished in NAD. Neck:  Neck supple, trachea midline. No masses, lumps or thyromegaly present.  Cardiovascular: Normal rate and  rhythm. S1,S2 noted.  No murmur, rubs or gallops noted.  Pulmonary/Chest: Normal effort and positive vesicular breath sounds. No respiratory distress. No wheezes, rales or ronchi noted.  Abdomen: Soft and nontender. Normal bowel sounds. No distention or masses noted.  Neurological: Alert and oriented.  Psychiatric: Mood and affect normal. Behavior is normal. Judgment and thought content normal.    BMET    Component Value Date/Time   NA 138 05/23/2017 0222   K 3.6 05/23/2017 0222   CL 107 05/23/2017 0222   CO2 24 05/23/2017 0222   GLUCOSE 134 (  H) 05/23/2017 0222   BUN 13 05/23/2017 0222   CREATININE 0.63 05/23/2017 0222   CALCIUM 8.7 (L) 05/23/2017 0222   GFRNONAA >60 05/23/2017 0222   GFRAA >60 05/23/2017 0222    Lipid Panel     Component Value Date/Time   CHOL 136 05/23/2017 0222   TRIG 47 05/23/2017 0222   HDL 75 05/23/2017 0222   CHOLHDL 1.8 05/23/2017 0222   VLDL 9 05/23/2017 0222   LDLCALC 52 05/23/2017 0222    CBC    Component Value Date/Time   WBC 11.1 (H) 05/22/2017 2125   RBC 4.31 05/22/2017 2125   HGB 12.8 05/22/2017 2125   HCT 38.0 05/22/2017 2125   PLT 306 05/22/2017 2125   MCV 88.2 05/22/2017 2125   MCH 29.7 05/22/2017 2125   MCHC 33.7 05/22/2017 2125   RDW 12.1 05/22/2017 2125   LYMPHSABS 3.2 05/03/2013 1127   MONOABS 0.7 05/03/2013 1127   EOSABS 0.2 05/03/2013 1127   BASOSABS 0.0 05/03/2013 1127    Hgb A1C No results found for: HGBA1C          Assessment & Plan:   Hospital Follow Up for Chest Pain, GERD and Anxiety:  Hospital notes, labs and imaging reviewed I do not feel like this is cardiac in nature, but advised her to follow up with cardiology, she will call to schedule her appt I advised her to start taking the Elavil daily x 1 month, to see if this helps control her stress/anxiety She already reports  That if they do not hire her some help, she may be looking for alternative jobs. I also do not feel like her GERD is well  controlled Stop Ranitidine, eRx for Prilosec 20 mg daily, 30 minutes before breakfast Support offered today, she declines referral for therapy at this time  She will follow up with me in 1 month via mychart, sooner if needed Nicki ReaperBAITY, REGINA, NP

## 2017-06-03 ENCOUNTER — Encounter: Payer: Self-pay | Admitting: Internal Medicine

## 2017-06-03 NOTE — Patient Instructions (Signed)
Gastroesophageal Reflux Scan A gastroesophageal reflux scan is a procedure that is used to check for gastroesophageal reflux, which is the backward flow of stomach contents into the tube that carries food from the mouth to the stomach (esophagus). The scan can also show if any stomach contents are inhaled (aspirated) into your lungs. You may need this scan if you have symptoms such as heartburn, vomiting, swallowing problems, or regurgitation. Regurgitation means that swallowed food is returning from the stomach to the esophagus. For this scan, you will drink a liquid that contains a small amount of a radioactive substance (tracer). A scanner with a camera that detects the radioactive tracer is used to see if any of the material backs up into your esophagus. Tell a health care provider about:  Any allergies you have.  All medicines you are taking, including vitamins, herbs, eye drops, creams, and over-the-counter medicines.  Any blood disorders you have.  Any surgeries you have had.  Any medical conditions you have.  If you are pregnant or you think that you may be pregnant.  If you are breastfeeding. What are the risks? Generally, this is a safe procedure. However, problems may occur, including:  Exposure to radiation (a small amount).  Allergic reaction to the radioactive substance. This is rare.  What happens before the procedure?  Ask your health care provider about changing or stopping your regular medicines. This is especially important if you are taking diabetes medicines or blood thinners.  Follow your health care provider's instructions about eating or drinking restrictions. What happens during the procedure?  You will be asked to drink a liquid that contains a small amount of a radioactive tracer. This liquid will probably be similar to orange juice.  You will assume a position lying on your back.  A series of images will be taken of your esophagus and upper  stomach.  You may be asked to move into different positions to help determine if reflux occurs more often when you are in specific positions.  For adults, an abdominal binder with an inflatable cuff may be placed on the belly (abdomen). This may be used to increase abdominal pressure. More images will be taken to see if the increased pressure causes reflux to occur. The procedure may vary among health care providers and hospitals. What happens after the procedure?  Return to your normal activities and your normal diet as directed by your health care provider.  The radioactive tracer will leave your body over the next few days. Drink enough fluid to keep your urine clear or pale yellow. This will help to flush the tracer out of your body.  It is your responsibility to obtain your test results. Ask your health care provider or the department performing the test when and how you will get your results. This information is not intended to replace advice given to you by your health care provider. Make sure you discuss any questions you have with your health care provider. Document Released: 08/14/2005 Document Revised: 03/17/2016 Document Reviewed: 04/04/2014 Elsevier Interactive Patient Education  2018 Elsevier Inc.  

## 2017-06-18 ENCOUNTER — Ambulatory Visit (INDEPENDENT_AMBULATORY_CARE_PROVIDER_SITE_OTHER): Payer: 59 | Admitting: Podiatry

## 2017-06-18 DIAGNOSIS — B070 Plantar wart: Secondary | ICD-10-CM | POA: Diagnosis not present

## 2017-06-18 NOTE — Progress Notes (Signed)
She presents today for follow-up of her plantar fasciitis and to pick up her orthotics.  She states that she is doing very well and currently having no problems with the plantar fasciitis.  She has no pain on palpation of the medial calcaneal tubercle on physical exam.  Assessment: Resolving plantar fasciitis.  Plan: Follow-up with me on an as-needed basis.  Orthotics were dispensed today she was provided with both oral and written home-going instructions for the care and wear of her orthotics.

## 2017-06-25 ENCOUNTER — Other Ambulatory Visit (INDEPENDENT_AMBULATORY_CARE_PROVIDER_SITE_OTHER): Payer: PRIVATE HEALTH INSURANCE

## 2017-06-25 DIAGNOSIS — E063 Autoimmune thyroiditis: Secondary | ICD-10-CM | POA: Diagnosis not present

## 2017-06-25 LAB — T4, FREE: FREE T4: 0.74 ng/dL (ref 0.60–1.60)

## 2017-06-25 LAB — T3, FREE: T3 FREE: 4.1 pg/mL (ref 2.3–4.2)

## 2017-06-25 LAB — TSH: TSH: 2.02 u[IU]/mL (ref 0.35–4.50)

## 2017-06-26 ENCOUNTER — Other Ambulatory Visit: Payer: 59

## 2017-07-03 ENCOUNTER — Other Ambulatory Visit: Payer: 59

## 2017-07-03 ENCOUNTER — Other Ambulatory Visit: Payer: Self-pay | Admitting: Family Medicine

## 2017-07-03 DIAGNOSIS — M79602 Pain in left arm: Secondary | ICD-10-CM

## 2017-07-03 DIAGNOSIS — M62838 Other muscle spasm: Secondary | ICD-10-CM

## 2017-07-06 NOTE — Telephone Encounter (Signed)
Was prescribed 04/2017 acute visit from KronenwetterGessner... Please advise

## 2017-07-06 NOTE — Telephone Encounter (Signed)
Not on patients current medication list okay to refill?

## 2017-07-08 NOTE — Progress Notes (Signed)
Patient ID: Kelly Tran, female   DOB: [birth year], 52 y.o.   MRN: [id number]   Reason for Appointment:  Hypothyroidism, followup visit   History of Present Illness:   The hypothyroidism was first diagnosed in 11/2011 Initially her hypothyroidism was diagnosed with symptoms of hair loss for several years and also fatigue and cold intolerance Notably her TSH at baseline was only 4.7 On her initial consultation she had been still having the symptoms above with taking 50 mcg of levothyroxine  With changing to Armour Thyroid 45 mg daily in 07/2012 her symptoms of fatigue and cold intolerance were improved However on her follow-up visit in 12/16 her TSH was 5.9 She was having symptoms of fatigue as usual along with some chronic hair loss  Since increasing her Armour Thyroid to 60 mg daily in 12/16 she was complaining about palpitations at times including at night. However this resolved with her stopping amitriptyline as directed in 2/17  RECENT history:  Since 06/2015 she was on 60 mg Armour Thyroid daily   Since her TSH was higher than usual at 7 in 02/2017 along with relatively low free T4 she was changed from Armour Thyroid to the combination of Synthroid 50 g daily along with Cytomel 10 g daily in the morning  With this change she had felt less tired than usual  Although she was feeling fairly good in 03/2017 her TSH was nearly 4.0 and she was told to take 8 tablets a week of levothyroxine and Cytomel was continued unchanged  She again feels fairly good usually Has had mild chronic cold intolerance Has however gained weight from inadequate diet and exercise regimen No hair loss Also does not feel shaky or having any palpitations  Compliance with the medical regimen has been as prescribed with taking the tablet in the morning before breakfast including recently. Not taking any calcium or iron-containing vitamins in the morning  Her TSH is  normal  T3 is upper normal  although free T4 is unchanged Labs were drawn in the afternoon  Wt Readings from Last 3 Encounters:  07/09/17 206 lb (93.4 kg)  06/02/17 202 lb (91.6 kg)  05/23/17 197 lb 8 oz (89.6 kg)    Lab Results  Component Value Date   TSH 2.02 06/25/2017   TSH 1.080 05/23/2017   TSH 3.95 03/25/2017   FREET4 0.74 06/25/2017   FREET4 0.71 03/25/2017   FREET4 0.58 (L) 02/05/2017    Lab Results  Component Value Date   T3FREE 4.1 06/25/2017   T3FREE 3.0 03/25/2017   T3FREE 2.3 02/05/2017       Allergies as of 07/09/2017   No Known Allergies     Medication List        Accurate as of 07/09/17  8:33 AM. Always use your most recent med list.          ALLEGRA PO Take 1 tablet daily as needed by mouth (seasonal allergies).   amitriptyline 25 MG tablet Commonly known as:  ELAVIL Take 1 tablet (25 mg total) by mouth at bedtime.   aspirin EC 81 MG tablet Take 81 mg daily by mouth.   CALCIUM PO Take 1 tablet daily with lunch by mouth.   cyclobenzaprine 5 MG tablet Commonly known as:  FLEXERIL Take 1 tablet (5 mg total) by mouth 3 (three) times daily as needed for muscle spasms.   fluticasone 50 MCG/ACT nasal spray Commonly known as:  FLONASE USE 2 SPRAYS INTO EACH NOSTRIL ONCE DAILY  AS DIRECTED.   levothyroxine 50 MCG tablet Commonly known as:  SYNTHROID, LEVOTHROID 1 tablet daily and extra 1 tablet once a week   liothyronine 5 MCG tablet Commonly known as:  CYTOMEL TAKE TWO TABLETS BY MOUTH DAILY   multivitamin with minerals Tabs tablet Take 1 tablet daily with lunch by mouth.   omeprazole 20 MG capsule Commonly known as:  PRILOSEC Take 1 capsule (20 mg total) by mouth daily.   SYSTANE OP Place 1 drop daily as needed into both eyes (dry eyes).   VITAMIN B-12 PO Take 1 tablet daily with lunch by mouth.   VITAMIN B-6 PO Take 1 tablet daily with lunch by mouth.   VITAMIN D PO Take 500 Units daily by mouth.       Past Medical History:  Diagnosis Date  .  Allergy   . G E R D 02/22/2007  . Genital warts   . Hemorrhoids, internal 01/31/2011  . Hypothyroidism   . Migraine     Past Surgical History:  Procedure Laterality Date  . APPENDECTOMY    . DILATION AND CURETTAGE OF UTERUS    . miscarriage     x3--required d and c    Family History  Problem Relation Age of Onset  . Stroke Mother   . Arthritis Mother   . Hypertension Father   . Arthritis Father   . Hyperlipidemia Father   . Cancer Maternal Aunt        ovarian  . Arthritis Maternal Grandmother   . Arthritis Maternal Grandfather   . Arthritis Paternal Grandmother   . Arthritis Paternal Grandfather   . Cancer Maternal Aunt        Breast  . Cancer Maternal Aunt        Lung  . Diabetes Neg Hx        family    Social History:  reports that she quit smoking about 21 years ago. she has never used smokeless tobacco. She reports that she drinks alcohol. She reports that she does not use drugs.  Allergies: No Known Allergies  ROS   She recently has been taking Prilosec for reflux    Examination:   BP 132/84   Pulse 76   Ht 5\' 9"  (1.753 m)   Wt 206 lb (93.4 kg)   LMP 03/11/2013   BMI 30.42 kg/m   She looks well.   Thyroid not palpable Hands are warm, no tremor Biceps reflexes appear normal     Assessment/ Plan:  Hypothyroidism,autoimmune   Currently with a combination of 50 g of levothyroxine, 8 days a week and 10 g of liothyronine her thyroid levels are stable improved although free T3 upper normal, asymptomatic She looks euthyroid and subjectively has done consistently well now Her TSH is stable at around 2 currently  Despite taking Prilosec her TSH has not gone up  She will continue the same regimen for the next 6 months She will call if she has any new symptoms   Total visit time 15 minutes   Saydi Kobel 07/09/2017, 8:33 AM

## 2017-07-09 ENCOUNTER — Encounter: Payer: Self-pay | Admitting: Endocrinology

## 2017-07-09 ENCOUNTER — Ambulatory Visit (INDEPENDENT_AMBULATORY_CARE_PROVIDER_SITE_OTHER): Payer: PRIVATE HEALTH INSURANCE | Admitting: Endocrinology

## 2017-07-09 VITALS — BP 132/84 | HR 76 | Ht 69.0 in | Wt 206.0 lb

## 2017-07-09 DIAGNOSIS — E063 Autoimmune thyroiditis: Secondary | ICD-10-CM

## 2017-08-05 ENCOUNTER — Other Ambulatory Visit: Payer: Self-pay | Admitting: Obstetrics and Gynecology

## 2017-08-05 DIAGNOSIS — Z1231 Encounter for screening mammogram for malignant neoplasm of breast: Secondary | ICD-10-CM

## 2017-08-11 ENCOUNTER — Other Ambulatory Visit: Payer: Self-pay | Admitting: Endocrinology

## 2017-08-25 ENCOUNTER — Ambulatory Visit
Admission: RE | Admit: 2017-08-25 | Discharge: 2017-08-25 | Disposition: A | Discharge status: Home / Self Care | Payer: No Typology Code available for payment source | Source: Ambulatory Visit | Attending: Obstetrics and Gynecology | Admitting: Obstetrics and Gynecology

## 2017-08-25 DIAGNOSIS — Z1231 Encounter for screening mammogram for malignant neoplasm of breast: Secondary | ICD-10-CM

## 2017-08-25 IMAGING — MG DIGITAL SCREENING BILATERAL MAMMOGRAM WITH TOMO AND CAD
8 series · 8 of 24 positions shown · non-contrast
Comparison: Previous exam(s).

CLINICAL DATA: Screening.

EXAM:
DIGITAL SCREENING BILATERAL MAMMOGRAM WITH TOMO AND CAD

[L CC synth-2D]
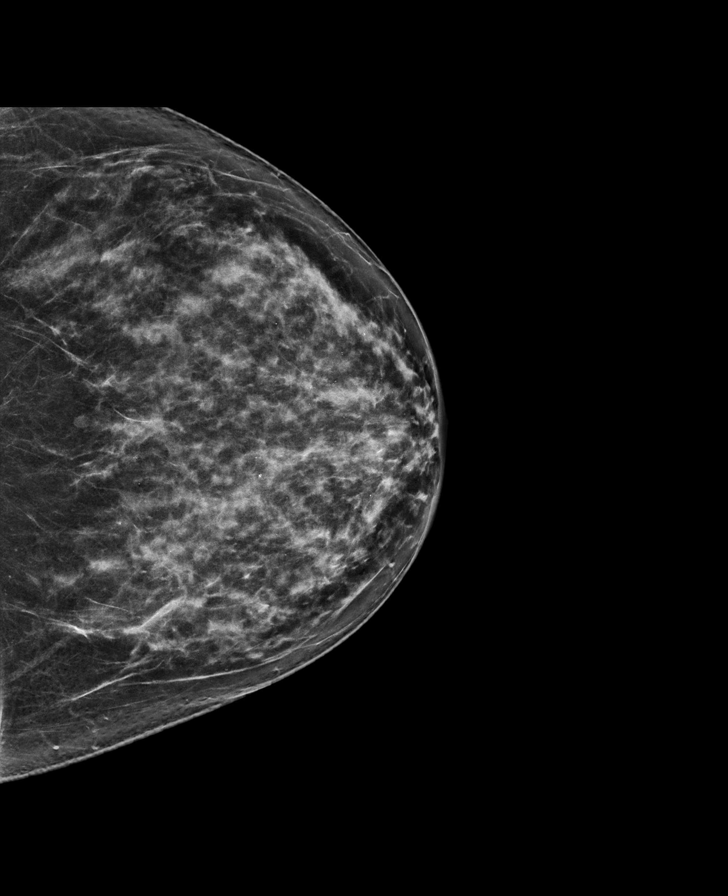

[R MLO synth-2D]
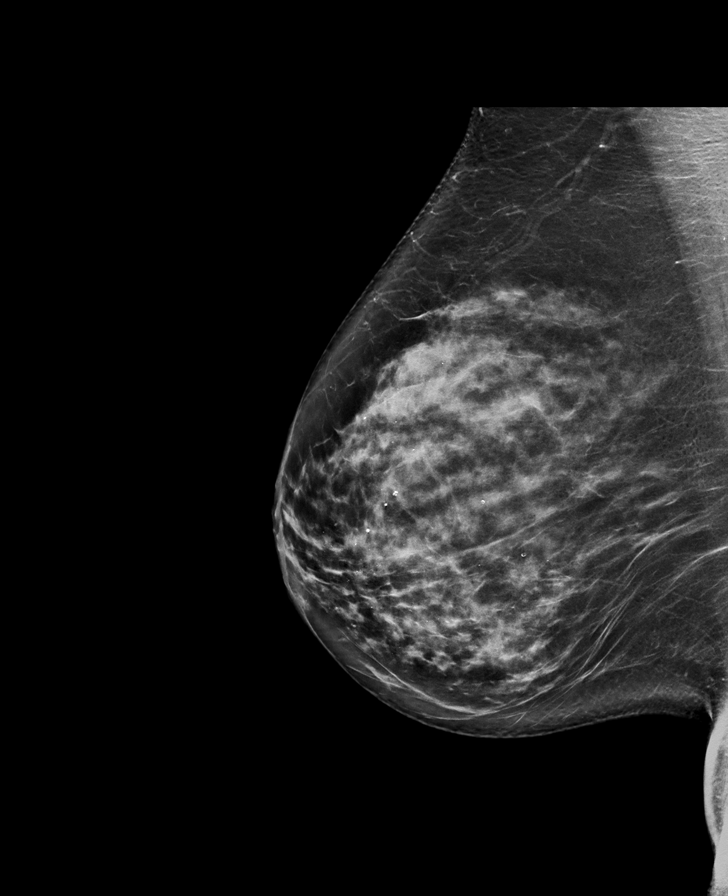

[L MLO synth-2D]
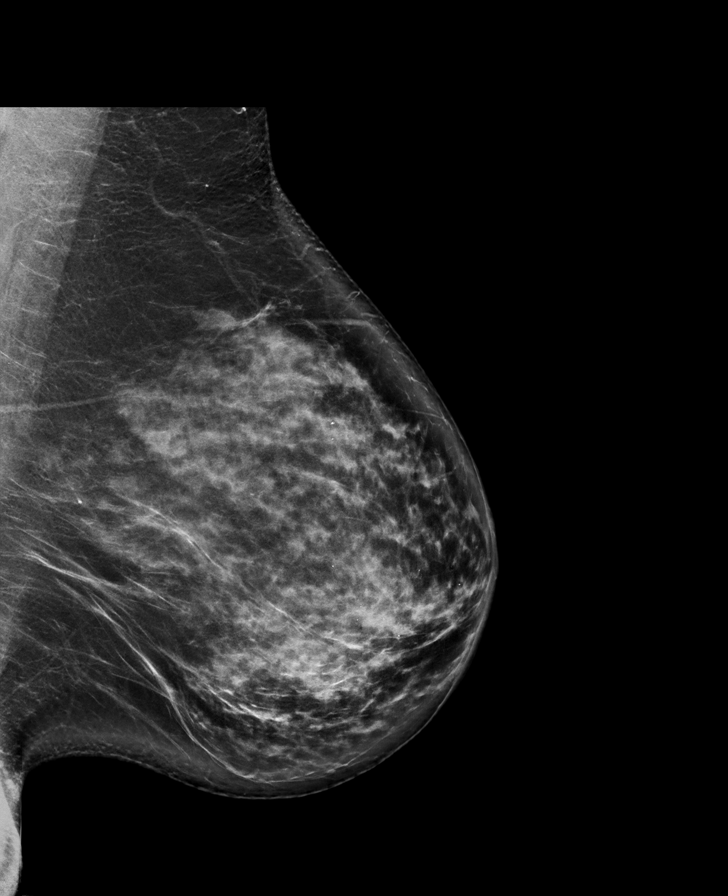

[R CC synth-2D]
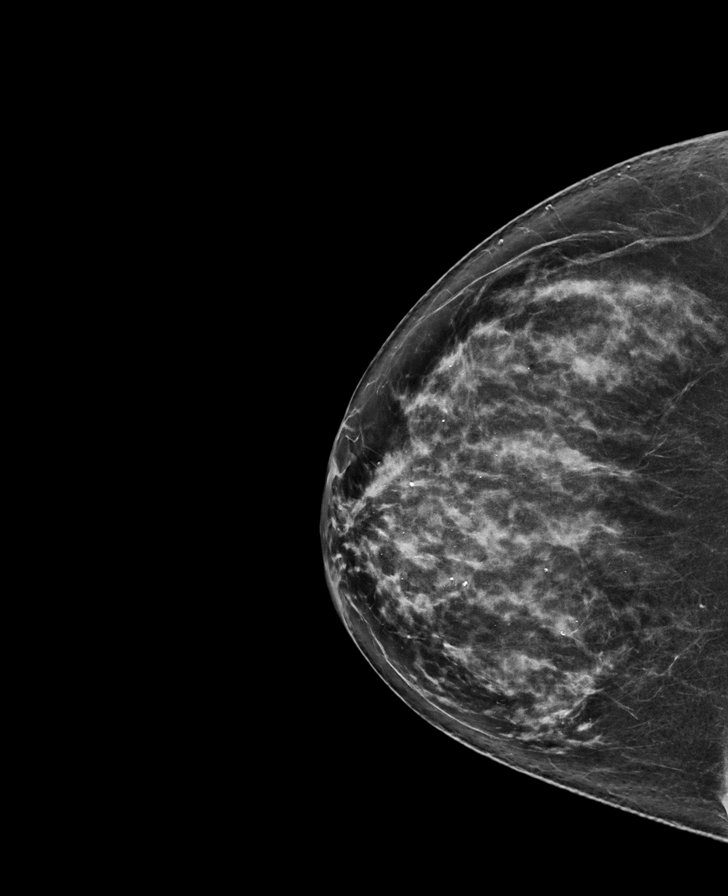

[L MLO tomo · tomo slice 47/94.0]
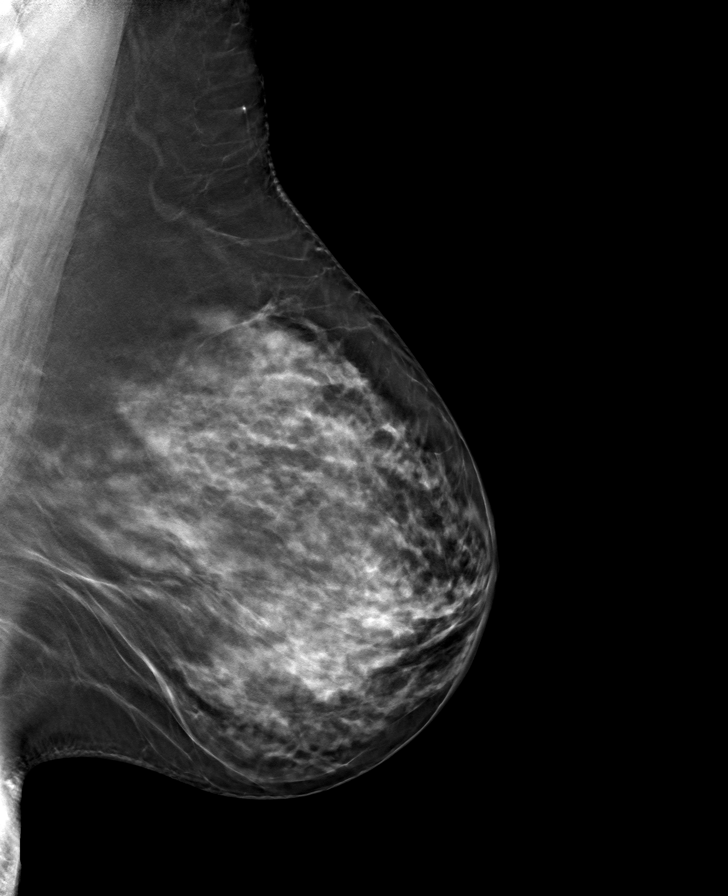

[R MLO tomo · tomo slice 45/89.0]
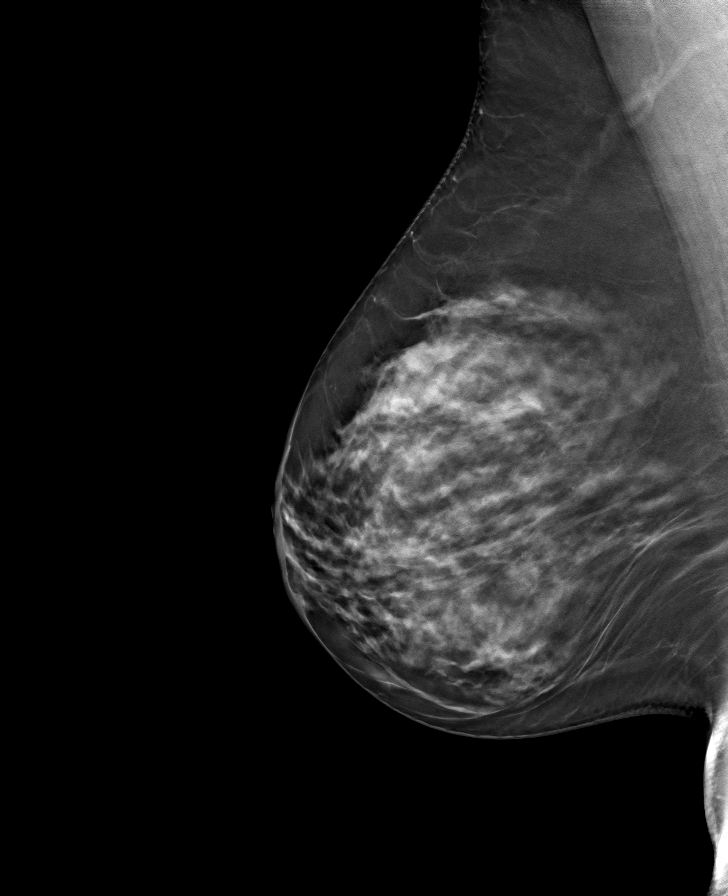

[R CC tomo · tomo slice 40/79.0]
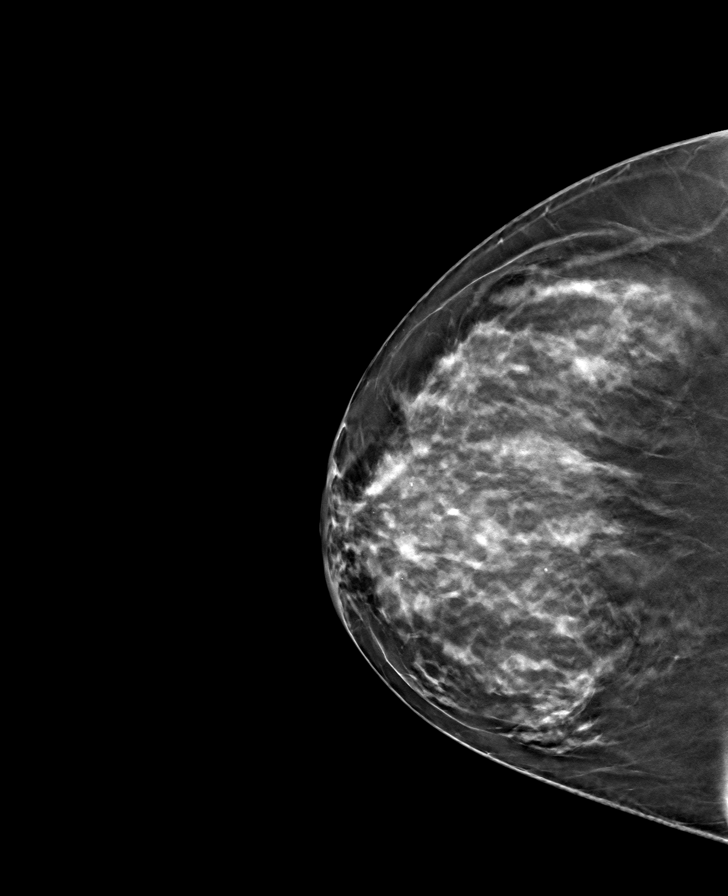

[L CC tomo · tomo slice 39/78.0]
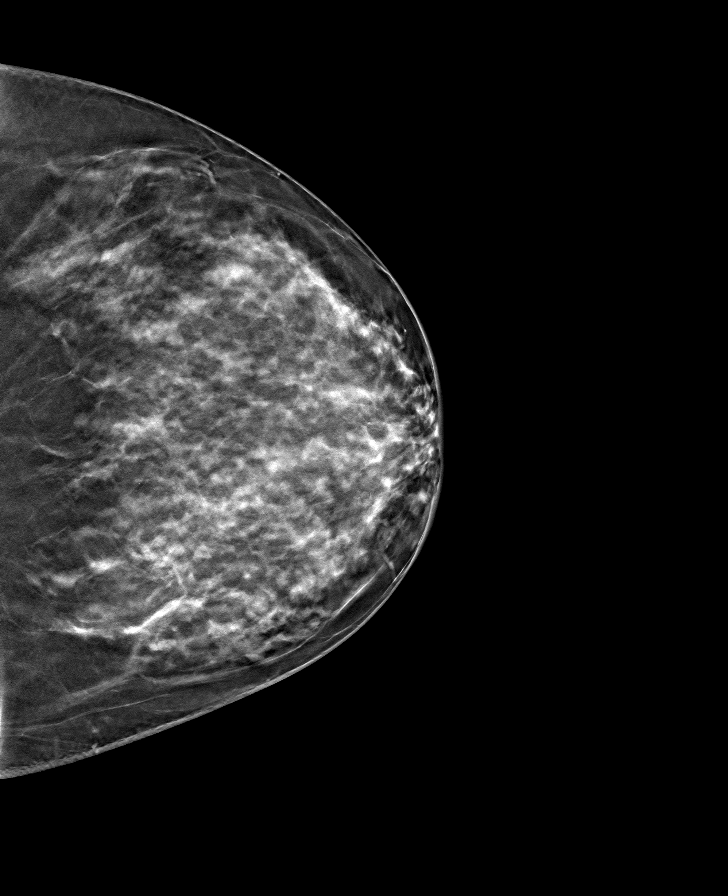

[8 of 24 positions shown; findings below may reference images not displayed]

ACR Breast Density Category d: The breast tissue is extremely dense,
which lowers the sensitivity of mammography.
FINDINGS: There are no findings suspicious for malignancy. Images were
processed with CAD.
IMPRESSION: No mammographic evidence of malignancy. A result letter of this
screening mammogram will be mailed directly to the patient.

RECOMMENDATION:
Screening mammogram in one year. (Code:[PU])

BI-RADS CATEGORY  1: Negative.

## 2017-09-28 ENCOUNTER — Telehealth: Payer: Self-pay | Admitting: Internal Medicine

## 2017-09-28 NOTE — Telephone Encounter (Signed)
Patient calling to find out when she is due for next colon. Last colon 2010 but no recall or letter in system. Former Dr.Patterson pt not requesting certain doc so DOD for this morning is Dr.Gessner. Please advise on scheduling.

## 2017-09-28 NOTE — Telephone Encounter (Signed)
Looks like would be due 08/2018.  Dr. Leone PayorGessner will you please review and advise.

## 2017-09-28 NOTE — Telephone Encounter (Signed)
Yes recall colon 08/2018  If having signs/sxs should be seen in office  Please assign her to Dr. Myrtie Neitheranis for the recall (he will be ok with this) and not me due to the size of my practice

## 2017-09-29 NOTE — Telephone Encounter (Signed)
Spoke with patient and notified her of recommendations. Patient agreed and states no signs/sxs at this time. Recall put in system with Dr.Danis for 08-2018.

## 2017-10-02 ENCOUNTER — Other Ambulatory Visit: Payer: Self-pay | Admitting: Internal Medicine

## 2017-10-02 DIAGNOSIS — J302 Other seasonal allergic rhinitis: Secondary | ICD-10-CM

## 2017-10-15 DIAGNOSIS — M25561 Pain in right knee: Secondary | ICD-10-CM | POA: Insufficient documentation

## 2017-11-04 ENCOUNTER — Other Ambulatory Visit: Payer: Self-pay | Admitting: Internal Medicine

## 2017-11-04 ENCOUNTER — Other Ambulatory Visit: Payer: Self-pay | Admitting: Endocrinology

## 2017-11-04 DIAGNOSIS — J302 Other seasonal allergic rhinitis: Secondary | ICD-10-CM

## 2017-12-06 ENCOUNTER — Other Ambulatory Visit: Payer: Self-pay | Admitting: Endocrinology

## 2017-12-07 ENCOUNTER — Other Ambulatory Visit: Payer: Self-pay | Admitting: Internal Medicine

## 2017-12-07 DIAGNOSIS — J302 Other seasonal allergic rhinitis: Secondary | ICD-10-CM

## 2017-12-10 DIAGNOSIS — M84351A Stress fracture, right femur, initial encounter for fracture: Secondary | ICD-10-CM | POA: Insufficient documentation

## 2017-12-10 DIAGNOSIS — S83249A Other tear of medial meniscus, current injury, unspecified knee, initial encounter: Secondary | ICD-10-CM | POA: Insufficient documentation

## 2018-01-07 ENCOUNTER — Other Ambulatory Visit: Payer: Self-pay | Admitting: Internal Medicine

## 2018-01-07 DIAGNOSIS — J302 Other seasonal allergic rhinitis: Secondary | ICD-10-CM

## 2018-01-08 NOTE — Telephone Encounter (Signed)
Last Rx indicates OV required for additional refills. pls advise

## 2018-01-11 ENCOUNTER — Other Ambulatory Visit: Payer: Self-pay

## 2018-01-12 ENCOUNTER — Other Ambulatory Visit (INDEPENDENT_AMBULATORY_CARE_PROVIDER_SITE_OTHER): Payer: PRIVATE HEALTH INSURANCE

## 2018-01-12 DIAGNOSIS — E063 Autoimmune thyroiditis: Secondary | ICD-10-CM

## 2018-01-12 LAB — T4, FREE: FREE T4: 0.79 ng/dL (ref 0.60–1.60)

## 2018-01-12 LAB — T3, FREE: T3, Free: 3.3 pg/mL (ref 2.3–4.2)

## 2018-01-12 LAB — TSH: TSH: 2.08 u[IU]/mL (ref 0.35–4.50)

## 2018-01-13 ENCOUNTER — Ambulatory Visit: Payer: PRIVATE HEALTH INSURANCE | Admitting: Endocrinology

## 2018-01-13 ENCOUNTER — Encounter: Payer: Self-pay | Admitting: Endocrinology

## 2018-01-13 VITALS — BP 120/82 | HR 67 | Ht 69.0 in | Wt 203.6 lb

## 2018-01-13 DIAGNOSIS — E063 Autoimmune thyroiditis: Secondary | ICD-10-CM | POA: Diagnosis not present

## 2018-01-13 NOTE — Progress Notes (Signed)
Patient ID: Kelly Tran, female   DOB: [birth year], 52 y.o.   MRN: [id number]   Reason for Appointment:  Hypothyroidism, followup visit   History of Present Illness:   The hypothyroidism was first diagnosed in 11/2011 Initially her hypothyroidism was diagnosed with symptoms of hair loss for several years and also fatigue and cold intolerance Notably her TSH at baseline was only 4.7 On her initial consultation she had been still having the symptoms above with taking 50 mcg of levothyroxine  With changing to Armour Thyroid 45 mg daily in 07/2012 her symptoms of fatigue and cold intolerance were improved However on her follow-up visit in 12/16 her TSH was 5.9 She was having symptoms of fatigue as usual along with some chronic hair loss  Since increasing her Armour Thyroid to 60 mg daily in 12/16 she was complaining about palpitations at times including at night. However this resolved with her stopping amitriptyline as directed in 2/17  RECENT history:  In 06/2015 she was started on 60 mg Armour Thyroid daily  Since her TSH was higher than usual at 7 in 02/2017 along with relatively low free T4 she was changed from Armour Thyroid to the combination of Synthroid 50 g daily along with Cytomel 10 g daily in the morning  With this change she had felt less tired than before  Subsequently in 03/2017 her TSH was nearly 4.0 and she was told to take 8 tablets a week of levothyroxine and Cytomel was continued unchanged She has been continued on the same dose since then  She again feels fairly good and no increased fatigue Has had mild chronic cold intolerance She does tend to have mild hair loss which is not any worse  Compliance with the medical regimen has been as prescribed with taking the tablet in the morning 30 minutes you are educated before breakfast they do not. Not taking any calcium or iron-containing vitamins in the morning  Her TSH is  normal  T3 is not as high as  before and free T4 is about the same   Wt Readings from Last 3 Encounters:  01/13/18 203 lb 9.6 oz (92.4 kg)  07/09/17 206 lb (93.4 kg)  06/02/17 202 lb (91.6 kg)    Lab Results  Component Value Date   TSH 2.08 01/12/2018   TSH 2.02 06/25/2017   TSH 1.080 05/23/2017   FREET4 0.79 01/12/2018   FREET4 0.74 06/25/2017   FREET4 0.71 03/25/2017    Lab Results  Component Value Date   T3FREE 3.3 01/12/2018   T3FREE 4.1 06/25/2017   T3FREE 3.0 03/25/2017       Allergies as of 01/13/2018   No Known Allergies     Medication List        Accurate as of 01/13/18  8:04 AM. Always use your most recent med list.          ALLEGRA PO Take 1 tablet daily as needed by mouth (seasonal allergies).   amitriptyline 25 MG tablet Commonly known as:  ELAVIL Take 1 tablet (25 mg total) by mouth at bedtime.   aspirin EC 81 MG tablet Take 81 mg daily by mouth.   CALCIUM PO Take 1 tablet daily with lunch by mouth.   cyclobenzaprine 5 MG tablet Commonly known as:  FLEXERIL Take 1 tablet (5 mg total) by mouth 3 (three) times daily as needed for muscle spasms.   fluticasone 50 MCG/ACT nasal spray Commonly known as:  FLONASE PLACE 2 SPRAYS INTO  BOTH NOSTRILS DAILY. MUST SCHEDULE ANNUAL PHYSICAL   levothyroxine 50 MCG tablet Commonly known as:  SYNTHROID, LEVOTHROID TAKE 1 TABLET BY MOUTH DAILY AND EXTRA 1 TABLET ONCE A WEEK   liothyronine 5 MCG tablet Commonly known as:  CYTOMEL TAKE TWO TABLETS BY MOUTH DAILY   multivitamin with minerals Tabs tablet Take 1 tablet daily with lunch by mouth.   omeprazole 20 MG capsule Commonly known as:  PRILOSEC Take 1 capsule (20 mg total) by mouth daily.   SYSTANE OP Place 1 drop daily as needed into both eyes (dry eyes).   VITAMIN B-12 PO Take 1 tablet daily with lunch by mouth.   VITAMIN B-6 PO Take 1 tablet daily with lunch by mouth.   VITAMIN D PO Take 500 Units daily by mouth.       Past Medical History:  Diagnosis Date    . Allergy   . G E R D 02/22/2007  . Genital warts   . Hemorrhoids, internal 01/31/2011  . Hypothyroidism   . Migraine     Past Surgical History:  Procedure Laterality Date  . APPENDECTOMY    . DILATION AND CURETTAGE OF UTERUS    . miscarriage     x3--required d and c    Family History  Problem Relation Age of Onset  . Stroke Mother   . Arthritis Mother   . Hypertension Father   . Arthritis Father   . Hyperlipidemia Father   . Cancer Maternal Aunt        ovarian  . Arthritis Maternal Grandmother   . Arthritis Maternal Grandfather   . Arthritis Paternal Grandmother   . Arthritis Paternal Grandfather   . Cancer Maternal Aunt        Breast  . Cancer Maternal Aunt        Lung  . Diabetes Neg Hx        family    Social History:  reports that she quit smoking about 21 years ago. She has never used smokeless tobacco. She reports that she drinks alcohol. She reports that she does not use drugs.  Allergies: No Known Allergies  ROS   She recently has not been taking Prilosec for reflux    Examination:   BP 120/82 (BP Location: Left Arm, Patient Position: Sitting, Cuff Size: Normal)   Pulse 67   Ht 5\' 9"  (1.753 m)   Wt 203 lb 9.6 oz (92.4 kg)   LMP 03/11/2013   SpO2 97%   BMI 30.07 kg/m   She looks well.   Thyroid not palpable  Biceps reflexes appear normal     Assessment/ Plan:  Hypothyroidism,autoimmune  She has been continued on a combination of 50 g of levothyroxine, 8 days a week and 10 g of liothyronine Her thyroid levels are stable She subjectively does feel fairly good now  Her TSH is stable at around 2 again  She will continue the same regimen for the next 6 months She will call if she has any new symptoms    Reather LittlerAjay Clinton Wahlberg 01/13/2018, 8:04 AM

## 2018-02-01 ENCOUNTER — Other Ambulatory Visit: Payer: Self-pay | Admitting: Endocrinology

## 2018-02-02 ENCOUNTER — Encounter: Payer: Self-pay | Admitting: Internal Medicine

## 2018-02-02 ENCOUNTER — Ambulatory Visit (INDEPENDENT_AMBULATORY_CARE_PROVIDER_SITE_OTHER): Payer: PRIVATE HEALTH INSURANCE | Admitting: Internal Medicine

## 2018-02-02 VITALS — BP 122/80 | HR 68 | Temp 98.0°F | Wt 204.0 lb

## 2018-02-02 DIAGNOSIS — R5383 Other fatigue: Secondary | ICD-10-CM

## 2018-02-02 DIAGNOSIS — E039 Hypothyroidism, unspecified: Secondary | ICD-10-CM | POA: Diagnosis not present

## 2018-02-02 LAB — COMPREHENSIVE METABOLIC PANEL
ALBUMIN: 4.2 g/dL (ref 3.5–5.2)
ALK PHOS: 87 U/L (ref 39–117)
ALT: 21 U/L (ref 0–35)
AST: 17 U/L (ref 0–37)
BUN: 17 mg/dL (ref 6–23)
CO2: 29 mEq/L (ref 19–32)
CREATININE: 0.72 mg/dL (ref 0.40–1.20)
Calcium: 9.6 mg/dL (ref 8.4–10.5)
Chloride: 103 mEq/L (ref 96–112)
GFR: 89.64 mL/min (ref >60.00)
GLUCOSE: 102 mg/dL — AB (ref 70–99)
Potassium: 4.2 mEq/L (ref 3.5–5.1)
Sodium: 138 mEq/L (ref 135–145)
TOTAL PROTEIN: 6.7 g/dL (ref 6.0–8.3)
Total Bilirubin: 0.8 mg/dL (ref 0.2–1.2)

## 2018-02-02 LAB — CBC
HCT: 39.5 % (ref 36.0–46.0)
Hemoglobin: 13.5 g/dL (ref 12.0–15.0)
MCHC: 34.2 g/dL (ref 30.0–36.0)
MCV: 89.4 fl (ref 78.0–100.0)
Platelets: 285 10*3/uL (ref 150.0–400.0)
RBC: 4.41 Mil/uL (ref 3.87–5.11)
RDW: 12.7 % (ref 11.5–15.5)
WBC: 6.7 10*3/uL (ref 4.0–10.5)

## 2018-02-02 LAB — VITAMIN B12: Vitamin B-12: 220 pg/mL (ref 211–911)

## 2018-02-02 LAB — VITAMIN D 25 HYDROXY (VIT D DEFICIENCY, FRACTURES): VITD: 22.45 ng/mL — ABNORMAL LOW (ref 30.00–100.00)

## 2018-02-02 LAB — TSH: TSH: 2.41 u[IU]/mL (ref 0.35–4.50)

## 2018-02-02 NOTE — Patient Instructions (Signed)

## 2018-02-02 NOTE — Progress Notes (Signed)
Subjective:    Patient ID: Kelly RocheMelinda M Tran, female    DOB: 01/07/1966, 52 y.o.   MRN: [id number]  HPI  Pt presents to the clinic today with c/o fatigue. She noticed this 2-3 months ago. She reports she just feels exhausted, no drive to get out of bed, no motivation to do anything. She recently had her TSH checked 3 weeks ago, and is taking Synthroid and Cytomel prescribed by Dr. Lucianne MussKumar. She averages 7 hours of sleep per night. She does snore, but wears a mouthpiece to help with this. She wakes up 2-3 times per night. She has trouble falling asleep and staying asleep. She does not feel rested when she wakes up. She has tried Advil PM with minimal relief. She denies recent increase in stress. She denies recent changes in diet or activity level.  Review of Systems      Past Medical History:  Diagnosis Date  . Allergy   . G E R D 02/22/2007  . Genital warts   . Hemorrhoids, internal 01/31/2011  . Hypothyroidism   . Migraine     Current Outpatient Medications  Medication Sig Dispense Refill  . amitriptyline (ELAVIL) 25 MG tablet Take 1 tablet (25 mg total) by mouth at bedtime. (Patient taking differently: Take 25 mg at bedtime as needed by mouth (anxiety/increased stress). ) 90 tablet 1  . aspirin EC 81 MG tablet Take 81 mg daily by mouth.    Marland Kitchen. CALCIUM PO Take 1 tablet daily with lunch by mouth.    . Cholecalciferol (VITAMIN D PO) Take 500 Units daily by mouth.    . Cyanocobalamin (VITAMIN B-12 PO) Take 1 tablet daily with lunch by mouth.    . Fexofenadine HCl (ALLEGRA PO) Take 1 tablet daily as needed by mouth (seasonal allergies).    . fluticasone (FLONASE) 50 MCG/ACT nasal spray PLACE 2 SPRAYS INTO BOTH NOSTRILS DAILY. MUST SCHEDULE ANNUAL PHYSICAL 16 g 0  . levothyroxine (SYNTHROID, LEVOTHROID) 50 MCG tablet TAKE 1 TABLET BY MOUTH DAILY AND EXTRA 1 TABLET ONCE A WEEK 34 tablet 3  . liothyronine (CYTOMEL) 5 MCG tablet TAKE TWO TABLETS BY MOUTH DAILY 60 tablet 2  . Multiple Vitamin  (MULTIVITAMIN WITH MINERALS) TABS tablet Take 1 tablet daily with lunch by mouth.    Bertram Gala. Polyethyl Glycol-Propyl Glycol (SYSTANE OP) Place 1 drop daily as needed into both eyes (dry eyes).    . Pyridoxine HCl (VITAMIN B-6 PO) Take 1 tablet daily with lunch by mouth.     No current facility-administered medications for this visit.     No Known Allergies  Family History  Problem Relation Age of Onset  . Stroke Mother   . Arthritis Mother   . Hypertension Father   . Arthritis Father   . Hyperlipidemia Father   . Cancer Maternal Aunt        ovarian  . Arthritis Maternal Grandmother   . Arthritis Maternal Grandfather   . Arthritis Paternal Grandmother   . Arthritis Paternal Grandfather   . Cancer Maternal Aunt        Breast  . Cancer Maternal Aunt        Lung  . Diabetes Neg Hx        family    Social History   Socioeconomic History  . Marital status: Married    Spouse name: Not on file  . Number of children: Not on file  . Years of education: Not on file  . Highest education level: Not on  file  Occupational History  . Not on file  Social Needs  . Financial resource strain: Not on file  . Food insecurity:    Worry: Not on file    Inability: Not on file  . Transportation needs:    Medical: Not on file    Non-medical: Not on file  Tobacco Use  . Smoking status: Former Smoker    Last attempt to quit: 01/31/1996    Years since quitting: 22.0  . Smokeless tobacco: Never Used  Substance and Sexual Activity  . Alcohol use: Yes    Alcohol/week: 0.0 oz    Comment: occ  . Drug use: No  . Sexual activity: Yes  Lifestyle  . Physical activity:    Days per week: Not on file    Minutes per session: Not on file  . Stress: Not on file  Relationships  . Social connections:    Talks on phone: Not on file    Gets together: Not on file    Attends religious service: Not on file    Active member of club or organization: Not on file    Attends meetings of clubs or organizations:  Not on file    Relationship status: Not on file  . Intimate partner violence:    Fear of current or ex partner: Not on file    Emotionally abused: Not on file    Physically abused: Not on file    Forced sexual activity: Not on file  Other Topics Concern  . Not on file  Social History Narrative  . Not on file     Constitutional: Pt reports fatigue. Denies fever, malaise, headache or abrupt weight changes.  Respiratory: Denies difficulty breathing, shortness of breath, cough or sputum production.   Cardiovascular: Denies chest pain, chest tightness, palpitations or swelling in the hands or feet.  Neurological: Pt reports insomnia. Denies dizziness, difficulty with memory, difficulty with speech or problems with balance and coordination.  Psych: Pt has a history of depression. Denies anxiety, SI/HI.  No other specific complaints in a complete review of systems (except as listed in HPI above).  Objective:   Physical Exam   BP 122/80   Pulse 68   Temp 98 F (36.7 C) (Oral)   Wt 204 lb (92.5 kg)   LMP 03/11/2013   SpO2 98%   BMI 30.13 kg/m  Wt Readings from Last 3 Encounters:  02/02/18 204 lb (92.5 kg)  01/13/18 203 lb 9.6 oz (92.4 kg)  07/09/17 206 lb (93.4 kg)    General: Appears her stated age, obese in NAD. Cardiovascular: Normal rate and rhythm. S1,S2 noted.  No murmur, rubs or gallops noted.  Pulmonary/Chest: Normal effort and positive vesicular breath sounds. No respiratory distress. No wheezes, rales or ronchi noted.  Neurological: Alert and oriented.  Psychiatric: Mood and affect normal. Behavior is normal. Judgment and thought content normal.     BMET    Component Value Date/Time   NA 138 05/23/2017 0222   K 3.6 05/23/2017 0222   CL 107 05/23/2017 0222   CO2 24 05/23/2017 0222   GLUCOSE 134 (H) 05/23/2017 0222   BUN 13 05/23/2017 0222   CREATININE 0.63 05/23/2017 0222   CALCIUM 8.7 (L) 05/23/2017 0222   GFRNONAA >60 05/23/2017 0222   GFRAA >60  05/23/2017 0222    Lipid Panel     Component Value Date/Time   CHOL 136 05/23/2017 0222   TRIG 47 05/23/2017 0222   HDL 75 05/23/2017 0222  CHOLHDL 1.8 05/23/2017 0222   VLDL 9 05/23/2017 0222   LDLCALC 52 05/23/2017 0222    CBC    Component Value Date/Time   WBC 11.1 (H) 05/22/2017 2125   RBC 4.31 05/22/2017 2125   HGB 12.8 05/22/2017 2125   HCT 38.0 05/22/2017 2125   PLT 306 05/22/2017 2125   MCV 88.2 05/22/2017 2125   MCH 29.7 05/22/2017 2125   MCHC 33.7 05/22/2017 2125   RDW 12.1 05/22/2017 2125   LYMPHSABS 3.2 05/03/2013 1127   MONOABS 0.7 05/03/2013 1127   EOSABS 0.2 05/03/2013 1127   BASOSABS 0.0 05/03/2013 1127    Hgb A1C No results found for: HGBA1C         Assessment & Plan:   Fatigue:  Will check CBC, CMET, TSH, Vit D and B12 today If labs normal, consider referral to pulmonology for sleep study  Return precautions discussed Nicki Reaper, NP

## 2018-02-03 ENCOUNTER — Other Ambulatory Visit: Payer: Self-pay | Admitting: Internal Medicine

## 2018-02-03 DIAGNOSIS — E559 Vitamin D deficiency, unspecified: Secondary | ICD-10-CM

## 2018-02-03 MED ORDER — VITAMIN D (ERGOCALCIFEROL) 1.25 MG (50000 UNIT) PO CAPS: 50000.0000 [IU] | ORAL_CAPSULE | ORAL | 0 refills | Status: DC

## 2018-02-03 NOTE — Progress Notes (Signed)
e

## 2018-02-08 ENCOUNTER — Ambulatory Visit: Payer: PRIVATE HEALTH INSURANCE | Admitting: Family Medicine

## 2018-02-08 NOTE — Progress Notes (Deleted)
   Subjective:    Patient ID: Kelly Tran, female    DOB: 01/06/1966, 52 y.o.   MRN: [id number]  HPI Presents to clinic due to insect bite on hand  Patient Active Problem List   Diagnosis Date Noted  . 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 GERD (gastroesophageal reflux disease) 11/20/2015  . Insomnia 11/20/2015  . Seasonal allergies 09/25/2014  . External hemorrhoid 04/26/2013  . Hypothyroidism 03/22/2013   Social History   Tobacco Use  . Smoking status: Former Smoker    Last attempt to quit: 01/31/1996    Years since quitting: 22.0  . Smokeless tobacco: Never Used  Substance Use Topics  . Alcohol use: Yes    Alcohol/week: 0.0 oz    Comment: occ    Review of Systems     Objective:   Physical Exam        Assessment & Plan:  Insect bite -

## 2018-02-09 ENCOUNTER — Telehealth: Payer: Self-pay | Admitting: Internal Medicine

## 2018-02-09 NOTE — Telephone Encounter (Signed)
Spoke to pt and advised ok to start monthly injections. Scheduled for 8/7

## 2018-02-09 NOTE — Telephone Encounter (Signed)
Copied from CRM [phone]. Topic: Quick Communication - See Telephone Encounter >> Feb 09, 2018  2:32 PM Lorayne BenderAlexander, Amber L wrote: CRM for notification. See Telephone encounter for: 02/09/18. Pt called in unsure of when she is supposed to start B12 injections.  Pt can be reached at [phone]

## 2018-02-10 ENCOUNTER — Telehealth: Payer: Self-pay

## 2018-02-10 ENCOUNTER — Ambulatory Visit (INDEPENDENT_AMBULATORY_CARE_PROVIDER_SITE_OTHER): Payer: PRIVATE HEALTH INSURANCE

## 2018-02-10 DIAGNOSIS — E538 Deficiency of other specified B group vitamins: Secondary | ICD-10-CM | POA: Diagnosis not present

## 2018-02-10 MED ORDER — CYANOCOBALAMIN 1000 MCG/ML IJ SOLN
1000.0000 ug | Freq: Once | INTRAMUSCULAR | Status: AC
Start: 2018-02-10 — End: 2018-02-10
  Administered 2018-02-10: 1000 ug via INTRAMUSCULAR

## 2018-02-10 NOTE — Telephone Encounter (Signed)
Pt was in today for first Vit B 12 injection and pt wants to know if she is to continue taking the oral Vit B 12 also. Please see lab result note on 02/02/18.Please advise.

## 2018-02-10 NOTE — Telephone Encounter (Signed)
Oral B12 likely not helping. Ok to stop.

## 2018-02-10 NOTE — Progress Notes (Signed)
Per orders of Regina Baity NP, injection of Vitamin B 12 given by Rena Isley. Patient tolerated injection well.  

## 2018-02-16 ENCOUNTER — Other Ambulatory Visit: Payer: Self-pay | Admitting: Internal Medicine

## 2018-02-16 DIAGNOSIS — J302 Other seasonal allergic rhinitis: Secondary | ICD-10-CM

## 2018-02-17 NOTE — Telephone Encounter (Signed)
Pt is aware as instructed 

## 2018-03-16 ENCOUNTER — Ambulatory Visit (INDEPENDENT_AMBULATORY_CARE_PROVIDER_SITE_OTHER): Payer: 59 | Admitting: Emergency Medicine

## 2018-03-16 DIAGNOSIS — E538 Deficiency of other specified B group vitamins: Secondary | ICD-10-CM | POA: Diagnosis not present

## 2018-03-16 MED ORDER — CYANOCOBALAMIN 1000 MCG/ML IJ SOLN
1000.0000 ug | Freq: Once | INTRAMUSCULAR | Status: AC
  Administered 2018-03-16: 1000 ug via INTRAMUSCULAR

## 2018-03-16 NOTE — Progress Notes (Signed)
Per orders of [facility], injection of b12 given by [employer]. Patient tolerated injection well. Received in right deltoid.

## 2018-03-23 ENCOUNTER — Ambulatory Visit (INDEPENDENT_AMBULATORY_CARE_PROVIDER_SITE_OTHER): Payer: 59 | Admitting: Internal Medicine

## 2018-03-23 ENCOUNTER — Encounter: Payer: Self-pay | Admitting: Internal Medicine

## 2018-03-23 VITALS — BP 118/78 | HR 73 | Temp 97.9°F | Ht 68.0 in | Wt 202.0 lb

## 2018-03-23 DIAGNOSIS — K219 Gastro-esophageal reflux disease without esophagitis: Secondary | ICD-10-CM | POA: Diagnosis not present

## 2018-03-23 DIAGNOSIS — E538 Deficiency of other specified B group vitamins: Secondary | ICD-10-CM | POA: Diagnosis not present

## 2018-03-23 DIAGNOSIS — Z0000 Encounter for general adult medical examination without abnormal findings: Secondary | ICD-10-CM

## 2018-03-23 DIAGNOSIS — E039 Hypothyroidism, unspecified: Secondary | ICD-10-CM

## 2018-03-23 DIAGNOSIS — G43909 Migraine, unspecified, not intractable, without status migrainosus: Secondary | ICD-10-CM

## 2018-03-23 DIAGNOSIS — G43C1 Periodic headache syndromes in child or adult, intractable: Secondary | ICD-10-CM

## 2018-03-23 HISTORY — DX: Migraine, unspecified, not intractable, without status migrainosus: G43.909

## 2018-03-23 NOTE — Patient Instructions (Signed)
Health Maintenance for Postmenopausal Women Menopause is a normal process in which your reproductive ability comes to an end. This process happens gradually over a span of months to years, usually between the ages of 22 and 9. Menopause is complete when you have missed 12 consecutive menstrual periods. It is important to talk with your health care provider about some of the most common conditions that affect postmenopausal women, such as heart disease, cancer, and bone loss (osteoporosis). Adopting a healthy lifestyle and getting preventive care can help to promote your health and wellness. Those actions can also lower your chances of developing some of these common conditions. What should I know about menopause? During menopause, you may experience a number of symptoms, such as:  Moderate-to-severe hot flashes.  Night sweats.  Decrease in sex drive.  Mood swings.  Headaches.  Tiredness.  Irritability.  Memory problems.  Insomnia.  Choosing to treat or not to treat menopausal changes is an individual decision that you make with your health care provider. What should I know about hormone replacement therapy and supplements? Hormone therapy products are effective for treating symptoms that are associated with menopause, such as hot flashes and night sweats. Hormone replacement carries certain risks, especially as you become older. If you are thinking about using estrogen or estrogen with progestin treatments, discuss the benefits and risks with your health care provider. What should I know about heart disease and stroke? Heart disease, heart attack, and stroke become more likely as you age. This may be due, in part, to the hormonal changes that your body experiences during menopause. These can affect how your body processes dietary fats, triglycerides, and cholesterol. Heart attack and stroke are both medical emergencies. There are many things that you can do to help prevent heart disease  and stroke:  Have your blood pressure checked at least every 1-2 years. High blood pressure causes heart disease and increases the risk of stroke.  If you are 53-22 years old, ask your health care provider if you should take aspirin to prevent a heart attack or a stroke.  Do not use any tobacco products, including cigarettes, chewing tobacco, or electronic cigarettes. If you need help quitting, ask your health care provider.  It is important to eat a healthy diet and maintain a healthy weight. ? Be sure to include plenty of vegetables, fruits, low-fat dairy products, and lean protein. ? Avoid eating foods that are high in solid fats, added sugars, or salt (sodium).  Get regular exercise. This is one of the most important things that you can do for your health. ? Try to exercise for at least 150 minutes each week. The type of exercise that you do should increase your heart rate and make you sweat. This is known as moderate-intensity exercise. ? Try to do strengthening exercises at least twice each week. Do these in addition to the moderate-intensity exercise.  Know your numbers.Ask your health care provider to check your cholesterol and your blood glucose. Continue to have your blood tested as directed by your health care provider.  What should I know about cancer screening? There are several types of cancer. Take the following steps to reduce your risk and to catch any cancer development as early as possible. Breast Cancer  Practice breast self-awareness. ? This means understanding how your breasts normally appear and feel. ? It also means doing regular breast self-exams. Let your health care provider know about any changes, no matter how small.  If you are 40  or older, have a clinician do a breast exam (clinical breast exam or CBE) every year. Depending on your age, family history, and medical history, it may be recommended that you also have a yearly breast X-ray (mammogram).  If you  have a family history of breast cancer, talk with your health care provider about genetic screening.  If you are at high risk for breast cancer, talk with your health care provider about having an MRI and a mammogram every year.  Breast cancer (BRCA) gene test is recommended for women who have family members with BRCA-related cancers. Results of the assessment will determine the need for genetic counseling and BRCA1 and for BRCA2 testing. BRCA-related cancers include these types: ? Breast. This occurs in males or females. ? Ovarian. ? Tubal. This may also be called fallopian tube cancer. ? Cancer of the abdominal or pelvic lining (peritoneal cancer). ? Prostate. ? Pancreatic.  Cervical, Uterine, and Ovarian Cancer Your health care provider may recommend that you be screened regularly for cancer of the pelvic organs. These include your ovaries, uterus, and vagina. This screening involves a pelvic exam, which includes checking for microscopic changes to the surface of your cervix (Pap test).  For women ages 21-65, health care providers may recommend a pelvic exam and a Pap test every three years. For women ages 79-65, they may recommend the Pap test and pelvic exam, combined with testing for human papilloma virus (HPV), every five years. Some types of HPV increase your risk of cervical cancer. Testing for HPV may also be done on women of any age who have unclear Pap test results.  Other health care providers may not recommend any screening for nonpregnant women who are considered low risk for pelvic cancer and have no symptoms. Ask your health care provider if a screening pelvic exam is right for you.  If you have had past treatment for cervical cancer or a condition that could lead to cancer, you need Pap tests and screening for cancer for at least 20 years after your treatment. If Pap tests have been discontinued for you, your risk factors (such as having a new sexual partner) need to be  reassessed to determine if you should start having screenings again. Some women have medical problems that increase the chance of getting cervical cancer. In these cases, your health care provider may recommend that you have screening and Pap tests more often.  If you have a family history of uterine cancer or ovarian cancer, talk with your health care provider about genetic screening.  If you have vaginal bleeding after reaching menopause, tell your health care provider.  There are currently no reliable tests available to screen for ovarian cancer.  Lung Cancer Lung cancer screening is recommended for adults 69-62 years old who are at high risk for lung cancer because of a history of smoking. A yearly low-dose CT scan of the lungs is recommended if you:  Currently smoke.  Have a history of at least 30 pack-years of smoking and you currently smoke or have quit within the past 15 years. A pack-year is smoking an average of one pack of cigarettes per day for one year.  Yearly screening should:  Continue until it has been 15 years since you quit.  Stop if you develop a health problem that would prevent you from having lung cancer treatment.  Colorectal Cancer  This type of cancer can be detected and can often be prevented.  Routine colorectal cancer screening usually begins at  age 42 and continues through age 45.  If you have risk factors for colon cancer, your health care provider may recommend that you be screened at an earlier age.  If you have a family history of colorectal cancer, talk with your health care provider about genetic screening.  Your health care provider may also recommend using home test kits to check for hidden blood in your stool.  A small camera at the end of a tube can be used to examine your colon directly (sigmoidoscopy or colonoscopy). This is done to check for the earliest forms of colorectal cancer.  Direct examination of the colon should be repeated every  5-10 years until age 71. However, if early forms of precancerous polyps or small growths are found or if you have a family history or genetic risk for colorectal cancer, you may need to be screened more often.  Skin Cancer  Check your skin from head to toe regularly.  Monitor any moles. Be sure to tell your health care provider: ? About any new moles or changes in moles, especially if there is a change in a mole's shape or color. ? If you have a mole that is larger than the size of a pencil eraser.  If any of your family members has a history of skin cancer, especially at a young age, talk with your health care provider about genetic screening.  Always use sunscreen. Apply sunscreen liberally and repeatedly throughout the day.  Whenever you are outside, protect yourself by wearing long sleeves, pants, a wide-brimmed hat, and sunglasses.  What should I know about osteoporosis? Osteoporosis is a condition in which bone destruction happens more quickly than new bone creation. After menopause, you may be at an increased risk for osteoporosis. To help prevent osteoporosis or the bone fractures that can happen because of osteoporosis, the following is recommended:  If you are 46-71 years old, get at least 1,000 mg of calcium and at least 600 mg of vitamin D per day.  If you are older than age 55 but younger than age 65, get at least 1,200 mg of calcium and at least 600 mg of vitamin D per day.  If you are older than age 54, get at least 1,200 mg of calcium and at least 800 mg of vitamin D per day.  Smoking and excessive alcohol intake increase the risk of osteoporosis. Eat foods that are rich in calcium and vitamin D, and do weight-bearing exercises several times each week as directed by your health care provider. What should I know about how menopause affects my mental health? Depression may occur at any age, but it is more common as you become older. Common symptoms of depression  include:  Low or sad mood.  Changes in sleep patterns.  Changes in appetite or eating patterns.  Feeling an overall lack of motivation or enjoyment of activities that you previously enjoyed.  Frequent crying spells.  Talk with your health care provider if you think that you are experiencing depression. What should I know about immunizations? It is important that you get and maintain your immunizations. These include:  Tetanus, diphtheria, and pertussis (Tdap) booster vaccine.  Influenza every year before the flu season begins.  Pneumonia vaccine.  Shingles vaccine.  Your health care provider may also recommend other immunizations. This information is not intended to replace advice given to you by your health care provider. Make sure you discuss any questions you have with your health care provider. Document Released: 08/15/2005  Document Revised: 01/11/2016 Document Reviewed: 03/27/2015 Elsevier Interactive Patient Education  2018 Elsevier Inc.  

## 2018-03-23 NOTE — Assessment & Plan Note (Signed)
Continue Amitriptyline as needed

## 2018-03-23 NOTE — Assessment & Plan Note (Signed)
Will check TSH, Free T4 and T3 today Continue Levothyroxine and Cytomel for now

## 2018-03-23 NOTE — Assessment & Plan Note (Signed)
Discussed avoiding foods that trigger reflux Continue Ranitidine for now CBC and CMET  today

## 2018-03-23 NOTE — Progress Notes (Signed)
Subjective:    Patient ID: Kelly Tran, female    DOB: [birth year], 52 y.o.   MRN: [id number]  HPI  Pt presents to the clinic today for her annual exam. She is also due to follow up chronic conditions.  Migraine: Triggered by stress. She reports improvement with Vit B12 and Vit D. She takes Amitriptyline as needed with good relief.  GERD: Intermittent. Triggered by tomato based products and alcohol. She tries to consume a plant based diet which seems to help. She is taking Ranitidine as needed with good relief.  Hypothyroidism: She denies any issues on her current dose of Levothyroxine and Cytomel. She follows with Dr. Lucianne Muss.   Flu: never Tetanus: 04/2009 Pap Smear: 06/2016 Mammogram: 08/2017 Colon Screening: 02/2009 Vision Screening: annually Dentist: biannually  Diet: She does eat meat. She consumes fruits and veggies daily. She rarely eats fried foods. She drinks mostly water and coffee. Exercise: None  Review of Systems      Past Medical History:  Diagnosis Date  . Allergy   . G E R D 02/22/2007  . Genital warts   . Hemorrhoids, internal 01/31/2011  . Hypothyroidism   . Migraine     Current Outpatient Medications  Medication Sig Dispense Refill  . amitriptyline (ELAVIL) 25 MG tablet Take 1 tablet (25 mg total) by mouth at bedtime. (Patient taking differently: Take 25 mg at bedtime as needed by mouth (anxiety/increased stress). ) 90 tablet 1  . aspirin EC 81 MG tablet Take 81 mg daily by mouth.    Marland Kitchen CALCIUM PO Take 1 tablet daily with lunch by mouth.    . Cholecalciferol (VITAMIN D PO) Take 500 Units daily by mouth.    . Cyanocobalamin (VITAMIN B-12 PO) Take 1 tablet daily with lunch by mouth.    . Fexofenadine HCl (ALLEGRA PO) Take 1 tablet daily as needed by mouth (seasonal allergies).    . fluticasone (FLONASE) 50 MCG/ACT nasal spray Place 2 sprays into both nostrils daily. 16 g 0  . levothyroxine (SYNTHROID, LEVOTHROID) 50 MCG tablet TAKE 1 TABLET BY MOUTH  DAILY AND EXTRA 1 TABLET ONCE A WEEK 34 tablet 3  . liothyronine (CYTOMEL) 5 MCG tablet TAKE TWO TABLETS BY MOUTH DAILY 60 tablet 2  . Multiple Vitamin (MULTIVITAMIN WITH MINERALS) TABS tablet Take 1 tablet daily with lunch by mouth.    Bertram Gala Glycol-Propyl Glycol (SYSTANE OP) Place 1 drop daily as needed into both eyes (dry eyes).    . Pyridoxine HCl (VITAMIN B-6 PO) Take 1 tablet daily with lunch by mouth.    . Vitamin D, Ergocalciferol, (DRISDOL) 50000 units CAPS capsule Take 1 capsule (50,000 Units total) by mouth every 7 (seven) days. 12 capsule 0   No current facility-administered medications for this visit.     No Known Allergies  Family History  Problem Relation Age of Onset  . Stroke Mother   . Arthritis Mother   . Hypertension Father   . Arthritis Father   . Hyperlipidemia Father   . Cancer Maternal Aunt        ovarian  . Arthritis Maternal Grandmother   . Arthritis Maternal Grandfather   . Arthritis Paternal Grandmother   . Arthritis Paternal Grandfather   . Cancer Maternal Aunt        Breast  . Cancer Maternal Aunt        Lung  . Diabetes Neg Hx        family    Social History  Socioeconomic History  . Marital status: Married    Spouse name: Not on file  . Number of children: Not on file  . Years of education: Not on file  . Highest education level: Not on file  Occupational History  . Not on file  Social Needs  . Financial resource strain: Not on file  . Food insecurity:    Worry: Not on file    Inability: Not on file  . Transportation needs:    Medical: Not on file    Non-medical: Not on file  Tobacco Use  . Smoking status: Former Smoker    Last attempt to quit: 01/31/1996    Years since quitting: 22.1  . Smokeless tobacco: Never Used  Substance and Sexual Activity  . Alcohol use: Yes    Alcohol/week: 0.0 standard drinks    Comment: occ  . Drug use: No  . Sexual activity: Yes  Lifestyle  . Physical activity:    Days per week: Not on  file    Minutes per session: Not on file  . Stress: Not on file  Relationships  . Social connections:    Talks on phone: Not on file    Gets together: Not on file    Attends religious service: Not on file    Active member of club or organization: Not on file    Attends meetings of clubs or organizations: Not on file    Relationship status: Not on file  . Intimate partner violence:    Fear of current or ex partner: Not on file    Emotionally abused: Not on file    Physically abused: Not on file    Forced sexual activity: Not on file  Other Topics Concern  . Not on file  Social History Narrative  . Not on file     Constitutional: Denies fever, malaise, fatigue, headache or abrupt weight changes.  HEENT: Denies eye pain, eye redness, ear pain, ringing in the ears, wax buildup, runny nose, nasal congestion, bloody nose, or sore throat. Respiratory: Denies difficulty breathing, shortness of breath, cough or sputum production.   Cardiovascular: Denies chest pain, chest tightness, palpitations or swelling in the hands or feet.  Gastrointestinal: Pt reports intermittent reflux. Denies abdominal pain, bloating, constipation, diarrhea or blood in the stool.  GU: Denies urgency, frequency, pain with urination, burning sensation, blood in urine, odor or discharge. Musculoskeletal: Denies decrease in range of motion, difficulty with gait, muscle pain or joint pain and swelling.  Skin: Denies redness, rashes, lesions or ulcercations.  Neurological: Denies dizziness, difficulty with memory, difficulty with speech or problems with balance and coordination.  Psych: Denies anxiety, depression, SI/HI.  No other specific complaints in a complete review of systems (except as listed in HPI above).  Objective:   Physical Exam   BP 118/78   Pulse 73   Temp 97.9 F (36.6 C) (Oral)   Ht 5\' 8"  (1.727 m)   Wt 202 lb (91.6 kg)   LMP 03/11/2013   SpO2 97%   BMI 30.71 kg/m  Wt Readings from Last 3  Encounters:  03/23/18 202 lb (91.6 kg)  02/02/18 204 lb (92.5 kg)  01/13/18 203 lb 9.6 oz (92.4 kg)    General: Appears her stated age, well developed, well nourished in NAD. Skin: Warm, dry and intact.  HEENT: Head: normal shape and size; Eyes: sclera white, no icterus, conjunctiva pink, PERRLA and EOMs intact; Ears: Tm's gray and intact, normal light reflex; Throat/Mouth: Teeth present, mucosa pink  and moist, no exudate, lesions or ulcerations noted.  Neck:  Neck supple, trachea midline. No masses, lumps present.  Cardiovascular: Normal rate and rhythm. S1,S2 noted.  No murmur, rubs or gallops noted. No JVD or BLE edema. No carotid bruits noted. Pulmonary/Chest: Normal effort and positive vesicular breath sounds. No respiratory distress. No wheezes, rales or ronchi noted.  Abdomen: Soft and nontender. Normal bowel sounds. No distention or masses noted. Liver, spleen and kidneys non palpable. Musculoskeletal: Strength 5/5 BUE/BLE. No difficulty with gait.  Neurological: Alert and oriented. Cranial nerves II-XII grossly intact. Coordination normal.  Psychiatric: Mood and affect normal. Behavior is normal. Judgment and thought content normal.    BMET    Component Value Date/Time   NA 138 02/02/2018 0924   K 4.2 02/02/2018 0924   CL 103 02/02/2018 0924   CO2 29 02/02/2018 0924   GLUCOSE 102 (H) 02/02/2018 0924   BUN 17 02/02/2018 0924   CREATININE 0.72 02/02/2018 0924   CALCIUM 9.6 02/02/2018 0924   GFRNONAA >60 05/23/2017 0222   GFRAA >60 05/23/2017 0222    Lipid Panel     Component Value Date/Time   CHOL 136 05/23/2017 0222   TRIG 47 05/23/2017 0222   HDL 75 05/23/2017 0222   CHOLHDL 1.8 05/23/2017 0222   VLDL 9 05/23/2017 0222   LDLCALC 52 05/23/2017 0222    CBC    Component Value Date/Time   WBC 6.7 02/02/2018 0924   RBC 4.41 02/02/2018 0924   HGB 13.5 02/02/2018 0924   HCT 39.5 02/02/2018 0924   PLT 285.0 02/02/2018 0924   MCV 89.4 02/02/2018 0924   MCH 29.7  05/22/2017 2125   MCHC 34.2 02/02/2018 0924   RDW 12.7 02/02/2018 0924   LYMPHSABS 3.2 05/03/2013 1127   MONOABS 0.7 05/03/2013 1127   EOSABS 0.2 05/03/2013 1127   BASOSABS 0.0 05/03/2013 1127    Hgb A1C No results found for: HGBA1C         Assessment & Plan:   Preventative Health Maintenance:  She declines flu shots Tetanus UTD Pap smear UTD Mammogram UTD Colon Screening due 2020 Encouraged her to consume a balanced diet and exercise regimen Advised her to see an eye doctor and dentist annually Will future order labs for CBC, CMET, Lipid, TSH, Free T4, T3, Vit D and B12  RTC in 1 year, sooner if needed Nicki Reaperegina Treon Kehl, NP

## 2018-03-27 ENCOUNTER — Other Ambulatory Visit: Payer: Self-pay | Admitting: Endocrinology

## 2018-04-14 ENCOUNTER — Encounter: Payer: Self-pay | Admitting: Internal Medicine

## 2018-04-20 ENCOUNTER — Ambulatory Visit (INDEPENDENT_AMBULATORY_CARE_PROVIDER_SITE_OTHER): Payer: 59 | Admitting: *Deleted

## 2018-04-20 DIAGNOSIS — E538 Deficiency of other specified B group vitamins: Secondary | ICD-10-CM | POA: Diagnosis not present

## 2018-04-20 MED ORDER — CYANOCOBALAMIN 1000 MCG/ML IJ SOLN
1000.0000 ug | Freq: Once | INTRAMUSCULAR | Status: AC
  Administered 2018-04-20: 1000 ug via INTRAMUSCULAR

## 2018-04-20 NOTE — Progress Notes (Signed)
Per orders of Dr. Duncan, injection of b12 given by Eliz Nigg H. Patient tolerated injection well.  

## 2018-04-22 ENCOUNTER — Other Ambulatory Visit: Payer: Self-pay | Admitting: Internal Medicine

## 2018-04-24 ENCOUNTER — Other Ambulatory Visit: Payer: Self-pay | Admitting: Endocrinology

## 2018-05-19 ENCOUNTER — Other Ambulatory Visit: Payer: 59

## 2018-05-19 ENCOUNTER — Encounter: Payer: Self-pay | Admitting: Internal Medicine

## 2018-05-19 DIAGNOSIS — R5383 Other fatigue: Secondary | ICD-10-CM

## 2018-05-21 ENCOUNTER — Other Ambulatory Visit (INDEPENDENT_AMBULATORY_CARE_PROVIDER_SITE_OTHER): Payer: 59

## 2018-05-21 DIAGNOSIS — Z0000 Encounter for general adult medical examination without abnormal findings: Secondary | ICD-10-CM

## 2018-05-21 DIAGNOSIS — E559 Vitamin D deficiency, unspecified: Secondary | ICD-10-CM

## 2018-05-21 DIAGNOSIS — E039 Hypothyroidism, unspecified: Secondary | ICD-10-CM | POA: Diagnosis not present

## 2018-05-21 DIAGNOSIS — E538 Deficiency of other specified B group vitamins: Secondary | ICD-10-CM | POA: Diagnosis not present

## 2018-05-21 DIAGNOSIS — R5383 Other fatigue: Secondary | ICD-10-CM

## 2018-05-21 LAB — CBC
HCT: 41.9 % (ref 36.0–46.0)
HEMOGLOBIN: 14.4 g/dL (ref 12.0–15.0)
MCHC: 34.3 g/dL (ref 30.0–36.0)
MCV: 88.6 fl (ref 78.0–100.0)
Platelets: 303 10*3/uL (ref 150.0–400.0)
RBC: 4.72 Mil/uL (ref 3.87–5.11)
RDW: 12.5 % (ref 11.5–15.5)
WBC: 5.9 10*3/uL (ref 4.0–10.5)

## 2018-05-21 LAB — COMPREHENSIVE METABOLIC PANEL
ALBUMIN: 4.7 g/dL (ref 3.5–5.2)
ALK PHOS: 91 U/L (ref 39–117)
ALT: 22 U/L (ref 0–35)
AST: 17 U/L (ref 0–37)
BUN: 20 mg/dL (ref 6–23)
CO2: 29 mEq/L (ref 19–32)
CREATININE: 0.82 mg/dL (ref 0.40–1.20)
Calcium: 10.1 mg/dL (ref 8.4–10.5)
Chloride: 101 mEq/L (ref 96–112)
GFR: 77.06 mL/min (ref >60.00)
Glucose, Bld: 91 mg/dL (ref 70–99)
POTASSIUM: 4.3 meq/L (ref 3.5–5.1)
SODIUM: 139 meq/L (ref 135–145)
TOTAL PROTEIN: 7.6 g/dL (ref 6.0–8.3)
Total Bilirubin: 1.5 mg/dL — ABNORMAL HIGH (ref 0.2–1.2)

## 2018-05-21 LAB — LIPID PANEL
CHOLESTEROL: 161 mg/dL (ref 0–200)
HDL: 90.5 mg/dL (ref >39.00)
LDL Cholesterol: 61 mg/dL (ref 0–99)
NONHDL: 70.12
Total CHOL/HDL Ratio: 2
Triglycerides: 44 mg/dL (ref 0.0–149.0)
VLDL: 8.8 mg/dL (ref 0.0–40.0)

## 2018-05-21 LAB — TSH: TSH: 3.77 u[IU]/mL (ref 0.35–4.50)

## 2018-05-21 LAB — VITAMIN D 25 HYDROXY (VIT D DEFICIENCY, FRACTURES): VITD: 32.55 ng/mL (ref 30.00–100.00)

## 2018-05-21 LAB — T4, FREE: FREE T4: 0.69 ng/dL (ref 0.60–1.60)

## 2018-05-21 LAB — VITAMIN B12: VITAMIN B 12: 357 pg/mL (ref 211–911)

## 2018-05-21 NOTE — Addendum Note (Signed)
Addended by: Wendi MayaGREESON, Kinsie Belford on: 05/21/2018 02:18 PM   Modules accepted: Orders

## 2018-05-21 NOTE — Addendum Note (Signed)
Addended by: Wendi MayaGREESON, Lakeya Mulka on: 05/21/2018 02:20 PM   Modules accepted: Orders

## 2018-05-22 LAB — T3: T3 TOTAL: 177 ng/dL (ref 76–181)

## 2018-05-25 ENCOUNTER — Ambulatory Visit (INDEPENDENT_AMBULATORY_CARE_PROVIDER_SITE_OTHER): Payer: 59

## 2018-05-25 DIAGNOSIS — E538 Deficiency of other specified B group vitamins: Secondary | ICD-10-CM

## 2018-05-25 MED ORDER — CYANOCOBALAMIN 1000 MCG/ML IJ SOLN
1000.0000 ug | Freq: Once | INTRAMUSCULAR | Status: AC
  Administered 2018-05-25: 1000 ug via INTRAMUSCULAR

## 2018-05-25 NOTE — Progress Notes (Signed)
Patient in office today for monthly B12 injection. Tolerated injection well to right deltoid.

## 2018-05-27 LAB — ESTROGENS, TOTAL: ESTROGEN: 96.4 pg/mL

## 2018-05-27 LAB — TESTOSTERONE: Testosterone: 12 ng/dL

## 2018-05-31 ENCOUNTER — Ambulatory Visit: Payer: 59

## 2018-05-31 ENCOUNTER — Encounter: Payer: Self-pay | Admitting: Internal Medicine

## 2018-05-31 DIAGNOSIS — E039 Hypothyroidism, unspecified: Secondary | ICD-10-CM

## 2018-05-31 DIAGNOSIS — R7989 Other specified abnormal findings of blood chemistry: Secondary | ICD-10-CM

## 2018-05-31 NOTE — Progress Notes (Signed)
Pt due for B12 injection. Last B12 level reviewed. Norton Bivins, NP    

## 2018-06-01 ENCOUNTER — Ambulatory Visit (INDEPENDENT_AMBULATORY_CARE_PROVIDER_SITE_OTHER): Payer: 59

## 2018-06-01 ENCOUNTER — Ambulatory Visit (INDEPENDENT_AMBULATORY_CARE_PROVIDER_SITE_OTHER): Payer: 59 | Admitting: Podiatry

## 2018-06-01 ENCOUNTER — Encounter: Payer: Self-pay | Admitting: Podiatry

## 2018-06-01 DIAGNOSIS — M722 Plantar fascial fibromatosis: Secondary | ICD-10-CM

## 2018-06-01 MED ORDER — METHYLPREDNISOLONE 4 MG PO TBPK: ORAL_TABLET | ORAL | 0 refills | Status: DC

## 2018-06-01 MED ORDER — MELOXICAM 15 MG PO TABS: 15.0000 mg | ORAL_TABLET | Freq: Every day | ORAL | 3 refills | Status: DC

## 2018-06-02 NOTE — Progress Notes (Signed)
She presents today with a chief complaint of pain to the right heel she states that sharp times the past 2 to 3 weeks but been coming on for several weeks.  She states that she felt a pop recently she still wears her orthotics  Objective: Vital signs are stable alert and oriented x3.  Pulses are palpable.  Neurologic sensorium is intact.  Degenerative flexors are intact.  Muscle strength is normal symmetrical bilateral.  Dermatological evaluation demonstrates no erythema edema cellulitis drainage or odor no open lesions or wounds nails are well.  She has pain on palpation medial calcaneal tubercle orthopedic evaluation.  Radiographs taken today demonstrate soft tissue increase in density plantar calcaneal insertion site.  Assessment: Plantar fasciitis.  Plan: Discussed etiology pathology conservative therapies at this point time start her on a Medrol Dosepak to be followed by meloxicam.  Also put her on a plantar fascial brace injected the heel with a 20 mg of Kenalog 5 mg Marcaine after sterile Betadine skin prep.  Tolerated procedure well follow-up with her in 1 month.

## 2018-06-22 ENCOUNTER — Ambulatory Visit (INDEPENDENT_AMBULATORY_CARE_PROVIDER_SITE_OTHER): Payer: 59 | Admitting: Internal Medicine

## 2018-06-22 ENCOUNTER — Encounter: Payer: Self-pay | Admitting: Internal Medicine

## 2018-06-22 VITALS — BP 126/80 | HR 68 | Ht 68.0 in | Wt 195.0 lb

## 2018-06-22 DIAGNOSIS — E063 Autoimmune thyroiditis: Secondary | ICD-10-CM

## 2018-06-22 LAB — T3, FREE: T3, Free: 3.7 pg/mL (ref 2.3–4.2)

## 2018-06-22 LAB — TSH: TSH: 0.84 u[IU]/mL (ref 0.35–4.50)

## 2018-06-22 LAB — T4, FREE: Free T4: 0.77 ng/dL (ref 0.60–1.60)

## 2018-06-22 NOTE — Progress Notes (Addendum)
Patient ID: Kelly Tran, female   DOB: September 08, 1965, 52 y.o.   MRN: [id number]    HPI  Kelly Tran is a 52 y.o.-year-old female, referred by her PCP, Sampson Si Salvadore [place], NP, for management of Hashimoto's hypothyroidism.  He previously saw Dr. Lucianne Muss, last visit 01/2018.  She would like to switch to see me.  Pt. has been dx with hypothyroidism in ~11/2011.  She was initially started on levothyroxine, but she did not tolerate this well.    She changed to Armour in 07/2012 at 45 mg daily >>  increased to 60 mg daily in 06/2015.  However, she developed palpitations (these stopped after stopping amitriptyline).    Her medication was changed to levothyroxine 50 mcg + liothyronine 2x5 mg daily in 02/2017 >> increased to 8 tablets LT4 + 14 tablets LT3 a week in 05/2018.  At that time, TSH was 3.77.  She takes the thyroid hormone: - fasting - with water - Coffee  -black in am - separated by 30 min-2h from b'fast  - no iron - not on Omeprazole any more  - + Calcium - at night - + Multivitamins - later in the day  I reviewed pt's thyroid tests: Lab Results  Component Value Date   TSH 3.77 05/21/2018   TSH 2.41 02/02/2018   TSH 2.08 01/12/2018   TSH 2.02 06/25/2017   TSH 1.080 05/23/2017   TSH 3.95 03/25/2017   TSH 7.04 (H) 02/05/2017   TSH 2.37 07/09/2016   TSH 3.28 12/28/2015   TSH 3.57 11/20/2015   FREET4 0.69 05/21/2018   FREET4 0.79 01/12/2018   FREET4 0.74 06/25/2017   FREET4 0.71 03/25/2017   FREET4 0.58 (L) 02/05/2017   FREET4 0.66 07/09/2016   FREET4 0.67 12/28/2015   FREET4 0.56 (L) 11/20/2015   FREET4 0.58 (L) 08/06/2015   FREET4 0.62 06/21/2015   T3FREE 3.3 01/12/2018   T3FREE 4.1 06/25/2017   T3FREE 3.0 03/25/2017   T3FREE 2.3 02/05/2017   T3FREE 2.6 07/09/2016   T3FREE 4.0 12/28/2015   T3FREE 3.0 08/06/2015   T3FREE 3.7 06/21/2015   T3FREE 3.2 09/25/2014   T3FREE 2.9 09/27/2013   I do not have the results of her previous antithyroid antibody levels: No  results found for: THGAB No components found for: TPOAB  Pt describes: -No weight gain -+ Fatigue -+ Cold intolerance -No depression -+ Constipation -+ Hair loss  Pt denies feeling nodules in neck, hoarseness, dysphagia/odynophagia, SOB with lying down.  She has no FH of thyroid disorders. No FH of thyroid cancer.  She was on Biotin  - last dose 3-4 mo ago. No h/o radiation tx to head or neck. Started [employer]. No recent use of iodine supplements. She had a recent Prednisone taper - last before Txgiving.  Pt. also has a history of GERD, multiple pregnancy losses, low vitamin B-12 and D, and also low testosterone.  ROS: Constitutional: + See HPI Eyes: no blurry vision, no xerophthalmia ENT: no sore throat, + see HPI Cardiovascular: no CP/SOB/palpitations/leg swelling Respiratory: no cough/SOB Gastrointestinal: no N/V/D/C Musculoskeletal: no muscle/joint aches Skin: no rashes, + hair loss Neurological: no tremors/numbness/tingling/dizziness Psychiatric: no Depression/anxiety  Past Medical History:  Diagnosis Date  . Allergy   . G E R D 02/22/2007  . Genital warts   . Hemorrhoids, internal 01/31/2011  . Hypothyroidism   . Migraine    Past Surgical History:  Procedure Laterality Date  . APPENDECTOMY    . DILATION AND CURETTAGE OF UTERUS    .  miscarriage     x3--required d and c   Social History   Socioeconomic History  . Marital status: Married    Spouse name: Not on file  . Number of children: Not on file  . Years of education: Not on file  . Highest education level: Not on file  Occupational History  . Not on file  Social Needs  . Financial resource strain: Not on file  . Food insecurity:    Worry: Not on file    Inability: Not on file  . Transportation needs:    Medical: Not on file    Non-medical: Not on file  Tobacco Use  . Smoking status: Former Smoker    Last attempt to quit: 01/31/1996    Years since quitting: 22.4  . Smokeless tobacco:  Never Used  Substance and Sexual Activity  . Alcohol use: Yes    Alcohol/week: 0.0 standard drinks    Comment: occ  . Drug use: No  . Sexual activity: Yes  Lifestyle  . Physical activity:    Days per week: Not on file    Minutes per session: Not on file  . Stress: Not on file  Relationships  . Social connections:    Talks on phone: Not on file    Gets together: Not on file    Attends religious service: Not on file    Active member of club or organization: Not on file    Attends meetings of clubs or organizations: Not on file    Relationship status: Not on file  . Intimate partner violence:    Fear of current or ex partner: Not on file    Emotionally abused: Not on file    Physically abused: Not on file    Forced sexual activity: Not on file  Other Topics Concern  . Not on file  Social History Narrative  . Not on file   Current Outpatient Medications on File Prior to Visit  Medication Sig Dispense Refill  . aspirin EC 81 MG tablet Take 81 mg daily by mouth.    Marland Kitchen CALCIUM PO Take 1 tablet daily with lunch by mouth.    . Cyanocobalamin (VITAMIN B-12 PO) Take 1 tablet daily with lunch by mouth.    . Fexofenadine HCl (ALLEGRA PO) Take 1 tablet daily as needed by mouth (seasonal allergies).    . fluticasone (FLONASE) 50 MCG/ACT nasal spray Place 2 sprays into both nostrils daily. 16 g 0  . levothyroxine (SYNTHROID, LEVOTHROID) 50 MCG tablet TAKE 1 TABLET BY MOUTH DAILY AND EXTRA 1 TABLET ONCE A WEEK 34 tablet 3  . liothyronine (CYTOMEL) 5 MCG tablet TAKE TWO TABLETS BY MOUTH DAILY 60 tablet 2  . meloxicam (MOBIC) 15 MG tablet Take 1 tablet (15 mg total) by mouth daily. 30 tablet 3  . Multiple Vitamin (MULTIVITAMIN WITH MINERALS) TABS tablet Take 1 tablet daily with lunch by mouth.    . Pyridoxine HCl (VITAMIN B-6 PO) Take 1 tablet daily with lunch by mouth.    . Cholecalciferol (VITAMIN D PO) Take 500 Units daily by mouth.     No current facility-administered medications on file  prior to visit.    No Known Allergies Family History  Problem Relation Age of Onset  . Stroke Mother   . Arthritis Mother   . Hypertension Father   . Arthritis Father   . Hyperlipidemia Father   . Cancer Maternal Aunt        ovarian  . Arthritis Maternal Grandmother   .  Arthritis Maternal Grandfather   . Arthritis Paternal Grandmother   . Arthritis Paternal Grandfather   . Cancer Maternal Aunt        Breast  . Cancer Maternal Aunt        Lung  . Diabetes Neg Hx        family    PE: BP 126/80   Pulse 68   Ht 5\' 8"  (1.727 m)   Wt 195 lb (88.5 kg)   LMP 03/11/2013   SpO2 98%   BMI 29.65 kg/m  Wt Readings from Last 3 Encounters:  06/22/18 195 lb (88.5 kg)  03/23/18 202 lb (91.6 kg)  02/02/18 204 lb (92.5 kg)   Constitutional: overweight, in NAD Eyes: PERRLA, EOMI, no exophthalmos ENT: moist mucous membranes, no thyromegaly, no cervical lymphadenopathy Cardiovascular: RRR, No MRG Respiratory: CTA B Gastrointestinal: abdomen soft, NT, ND, BS+ Musculoskeletal: no deformities, strength intact in all 4 Skin: moist, warm, no rashes Neurological: no tremor with outstretched hands, DTR normal in all 4  ASSESSMENT: 1. Hypothyroidism -Due to Hashimoto's thyroiditis  2.  Low testosterone  PLAN:  1. Patient with long-standing hypothyroidism, on levothyroxine + liothyronine therapy.  She is currently on the equivalent of: - 57 mcg LT4 daily - 10 mcg LT3 >> equivalent of 40 mcg LT4 daily (Total equivalent LT 4 daily dose: 97 mcg) - she appears euthyroid but has several complaints that could stem from hypothyroidism: Fatigue, constipation, feeling cold.  She is interested in returning to Armour.  She feels that this was working well, however, she was reportedly changed to an LT4+ LT3 regimen due to abnormality in 1 of the thyroid tests.  Reviewing her chart, this was probably a slightly low free T4.  We discussed that the TSH is the most important measure that gauges the  treatment with Armour.  Free T4 and free T3 levels will fluctuate and they greatly depend on the timing of Armour dosing. - she does not appear to have a goiter, thyroid nodules, or neck compression symptoms - We discussed about correct intake of thyroid hormones, fasting, with water, separated by at least 30 minutes from breakfast, and separated by more than 4 hours from calcium, iron, multivitamins, acid reflux medications (PPIs).  She is taking this correctly.  I advised her that she can take levothyroxine with coffee if she is doing any black. - will check thyroid tests today: TSH, free T4, free T3. We will calculate the required dose of Armour after these are back.  I will also add , TPO, ATA antibodies as I do not see a record of these being elevated, although she does have a history of Hashimoto's thyroiditis per review of the chart.  We discussed the potential use of selenium supplement if these are elevated.  She did change her diet recently to eliminate gluten, nuts, dairy (lost 7 pounds), and we discussed that these are all positive changes that may have decreased her antibodies in case they were elevated in the past. -She will need to return in ~5-6 weeks for repeat labs - I will see her back in 4 months  2.  Low testosterone -I explained that this is not an unusual finding in a postmenopausal woman and as of now, there is no indication for treatment.  - time spent with the patient: 40 minutes, of which >50% was spent in obtaining information about her symptoms, reviewing her previous labs, evaluations, and treatments, counseling her about her condition (please see the discussed topics above),  and developing a plan to further investigate and treat it; she had a number of questions which I addressed.  Component     Latest Ref Rng & Units 06/22/2018  T4,Free(Direct)     0.60 - 1.60 ng/dL 1.61  TSH     0.96 - 0.45 uIU/mL 0.84  Triiodothyronine,Free,Serum     2.3 - 4.2 pg/mL 3.7   Thyroglobulin Ab     < or = 1 IU/mL <1  Thyroperoxidase Ab SerPl-aCnc     <9 IU/mL 822 (H)   New diagnosis of Hashimoto's thyroiditis.  I will advised her to start selenium 200 mcg daily. Thyroid tests are normal.  I would suggest to start 60 mg of Armour daily. We will repeat her TFTs in 1.5 months.  Carlus Pavlov, MD PhD [facility]

## 2018-06-22 NOTE — Patient Instructions (Signed)
Please stop at the lab.  Continue the same doses of Levothyroxine and Liothyronine.  Take the thyroid hormone every day, with water, at least 30 minutes before breakfast, separated by at least 4 hours from: - acid reflux medications - calcium - iron - multivitamins  Please come back for a follow-up appointment in 4 months.

## 2018-06-23 LAB — THYROGLOBULIN ANTIBODY: Thyroglobulin Ab: <1 IU/mL (ref <=1)

## 2018-06-23 LAB — THYROID PEROXIDASE ANTIBODY: Thyroperoxidase Ab SerPl-aCnc: 822 IU/mL — ABNORMAL HIGH (ref <9)

## 2018-06-24 MED ORDER — ARMOUR THYROID 60 MG PO TABS: 60.0000 mg | ORAL_TABLET | Freq: Every day | ORAL | 3 refills | Status: DC

## 2018-07-09 ENCOUNTER — Other Ambulatory Visit: Payer: PRIVATE HEALTH INSURANCE

## 2018-07-13 ENCOUNTER — Ambulatory Visit (INDEPENDENT_AMBULATORY_CARE_PROVIDER_SITE_OTHER): Payer: 59 | Admitting: *Deleted

## 2018-07-13 ENCOUNTER — Other Ambulatory Visit: Payer: PRIVATE HEALTH INSURANCE

## 2018-07-13 DIAGNOSIS — E538 Deficiency of other specified B group vitamins: Secondary | ICD-10-CM

## 2018-07-13 MED ORDER — CYANOCOBALAMIN 1000 MCG/ML IJ SOLN
1000.0000 ug | Freq: Once | INTRAMUSCULAR | Status: AC
  Administered 2018-07-13: 1000 ug via INTRAMUSCULAR

## 2018-07-13 NOTE — Progress Notes (Signed)
Per orders of Dr. Duncan, injection of b12 given by Shyne Lehrke H. Patient tolerated injection well.  

## 2018-07-20 ENCOUNTER — Ambulatory Visit: Payer: PRIVATE HEALTH INSURANCE | Admitting: Endocrinology

## 2018-08-05 ENCOUNTER — Other Ambulatory Visit: Payer: 59

## 2018-08-06 ENCOUNTER — Other Ambulatory Visit (INDEPENDENT_AMBULATORY_CARE_PROVIDER_SITE_OTHER): Payer: 59

## 2018-08-06 ENCOUNTER — Other Ambulatory Visit: Payer: Self-pay | Admitting: Internal Medicine

## 2018-08-06 DIAGNOSIS — E063 Autoimmune thyroiditis: Secondary | ICD-10-CM | POA: Diagnosis not present

## 2018-08-06 LAB — T3, FREE: T3, Free: 3.3 pg/mL (ref 2.3–4.2)

## 2018-08-06 LAB — TSH: TSH: 7.3 u[IU]/mL — ABNORMAL HIGH (ref 0.35–4.50)

## 2018-08-06 LAB — T4, FREE: Free T4: 0.58 ng/dL — ABNORMAL LOW (ref 0.60–1.60)

## 2018-08-17 ENCOUNTER — Ambulatory Visit: Payer: 59

## 2018-09-04 ENCOUNTER — Encounter: Payer: Self-pay | Admitting: Gastroenterology

## 2018-09-21 ENCOUNTER — Other Ambulatory Visit (INDEPENDENT_AMBULATORY_CARE_PROVIDER_SITE_OTHER): Payer: 59

## 2018-09-21 ENCOUNTER — Other Ambulatory Visit: Payer: Self-pay

## 2018-09-21 ENCOUNTER — Other Ambulatory Visit: Payer: Self-pay | Admitting: Internal Medicine

## 2018-09-21 ENCOUNTER — Encounter: Payer: Self-pay | Admitting: Internal Medicine

## 2018-09-21 DIAGNOSIS — E063 Autoimmune thyroiditis: Secondary | ICD-10-CM

## 2018-09-21 LAB — TSH: TSH: 6.91 u[IU]/mL — ABNORMAL HIGH (ref 0.35–4.50)

## 2018-09-21 LAB — T4, FREE: Free T4: 0.57 ng/dL — ABNORMAL LOW (ref 0.60–1.60)

## 2018-09-21 LAB — T3, FREE: T3 FREE: 3.8 pg/mL (ref 2.3–4.2)

## 2018-09-21 MED ORDER — ARMOUR THYROID 90 MG PO TABS: 90.0000 mg | ORAL_TABLET | Freq: Every day | ORAL | 3 refills | Status: DC

## 2018-10-21 ENCOUNTER — Encounter: Payer: Self-pay | Admitting: Internal Medicine

## 2018-10-21 ENCOUNTER — Ambulatory Visit (INDEPENDENT_AMBULATORY_CARE_PROVIDER_SITE_OTHER): Payer: 59 | Admitting: Internal Medicine

## 2018-10-21 DIAGNOSIS — J301 Allergic rhinitis due to pollen: Secondary | ICD-10-CM | POA: Diagnosis not present

## 2018-10-21 MED ORDER — PREDNISONE 10 MG PO TABS: ORAL_TABLET | ORAL | 0 refills | Status: DC

## 2018-10-21 NOTE — Patient Instructions (Signed)

## 2018-10-21 NOTE — Progress Notes (Signed)
Virtual Visit via Video Note  I connected with Kelly Tran on 10/21/18 at  9:45 AM EDT by a video enabled telemedicine application and verified that I am speaking with the correct person using two identifiers.   I discussed the limitations of evaluation and management by telemedicine and the availability of in person appointments. The patient expressed understanding and agreed to proceed.  Patient Location: Work Dispensing optician: Office  History of Present Illness:  Pt reports nasal congestion, ear fullness, sore throat and cough. This started 3 weeks ago. She is blowing clear/yellow mucous out of her nose. She denies ear pain, drainage or decreased hearing. She denies difficulty swallowing. The cough is productive of clear mucous. She denies headache, facial pain and pressure, runny nose or shortness of breath. She denies fever, chills or body aches. She has tried Careers adviser, Benadryl, Mucinex and Flonase. She has a history of seasonal allergies and reports this feels slightly worse but she doesn't feel overtly sick. She has not had sick contacts that she is aware of.   Observations/Objective:  Alert and oriented x 3 NAD No obvious congestion, hoarseness, cough or shortness of breath  Assessment and Plan:  Allergic Rhinitis:  Continue Allegra, Benadryl and Flonase Will add Pred Taper x 6 days If no improvement, consider Amoxicillin for possible infection given duration of symptoms  Follow Up Instructions:    I discussed the assessment and treatment plan with the patient. The patient was provided an opportunity to ask questions and all were answered. The patient agreed with the plan and demonstrated an understanding of the instructions.   The patient was advised to call back or seek an in-person evaluation if the symptoms worsen or if the condition fails to improve as anticipated.    Nicki Reaper, NP

## 2018-10-26 ENCOUNTER — Telehealth: Payer: Self-pay | Admitting: Internal Medicine

## 2018-10-26 ENCOUNTER — Encounter: Payer: Self-pay | Admitting: Internal Medicine

## 2018-10-26 ENCOUNTER — Other Ambulatory Visit: Payer: Self-pay

## 2018-10-26 ENCOUNTER — Ambulatory Visit (INDEPENDENT_AMBULATORY_CARE_PROVIDER_SITE_OTHER): Payer: 59 | Admitting: Internal Medicine

## 2018-10-26 DIAGNOSIS — E063 Autoimmune thyroiditis: Secondary | ICD-10-CM | POA: Diagnosis not present

## 2018-10-26 DIAGNOSIS — E038 Other specified hypothyroidism: Secondary | ICD-10-CM

## 2018-10-26 NOTE — Telephone Encounter (Signed)
Call pt:  Get more info. Is she better or not? She is on the steroids which should treat the allergies. She should continue that, antihistamine and flonase. If worse, let me know.

## 2018-10-26 NOTE — Telephone Encounter (Signed)
Best number [phone]  Pt called stating she is better does  not have congested as she did.  But if she goes outside she gets congested.eye water cannot talk coughing  What does she need to do about this

## 2018-10-26 NOTE — Progress Notes (Addendum)
Patient ID: Kelly Tran, female   DOB: Nov 02, 1965, 53 y.o.   MRN: [id number]   Patient location: Work My location: Office  Referring Provider: Lorre Munroe, NP  I connected with the patient on 10/26/18 at  2:44 PM EDT by a video enabled telemedicine application and verified that I am speaking with the correct person.   I discussed the limitations of evaluation and management by telemedicine and the availability of in person appointments. The patient expressed understanding and agreed to proceed.   Details of the encounter are shown below.  HPI  Kelly Tran is a 53 y.o.-year-old female, initially referred by her PCP, Lorre Munroe, NP, returning for follow-up for Hashimoto's hypothyroidism.  He previously saw Dr. Lucianne Muss, last visit 01/2018.  I first saw the patient in 07/2018.  She has allergy sxs for 3 weeks >> now on a steroid taper.  Pt. has been dx with hypothyroidism in ~11/2011.  She was initially started on levothyroxine, but she did not tolerate this well.  She then was changed to Armour in 07/2012 and 45 mg daily, then increase to 60 mg daily in 06/2015.  However, she developed palpitations (stopped after stopping amitriptyline).  Her medication was changed to levothyroxine 50 mcg + liothyronine 10 mg daily in 02/2017.  This was increased to 8 tablets of levothyroxine and 14 tablets of liothyronine a week in 05/2018.  At that time, TSH was 3.77.  We switched to Armour 60 mg daily at last visit, however, we needed to increase the dose as her TSH remains elevated.  She is currently taking 90 mg daily: -Fasting, with water -black coffee in the morning -Eats breakfast 30 minutes to 2 hours afterwards -No iron, omeprazole, but takes calcium and multivitamins at night -No biotin  I reviewed pt's thyroid tests: Lab Results  Component Value Date   TSH 6.91 (H) 09/21/2018   TSH 7.30 (H) 08/06/2018   TSH 0.84 06/22/2018   TSH 3.77 05/21/2018   TSH 2.41 02/02/2018   TSH  2.08 01/12/2018   TSH 2.02 06/25/2017   TSH 1.080 05/23/2017   TSH 3.95 03/25/2017   TSH 7.04 (H) 02/05/2017   FREET4 0.57 (L) 09/21/2018   FREET4 0.58 (L) 08/06/2018   FREET4 0.77 06/22/2018   FREET4 0.69 05/21/2018   FREET4 0.79 01/12/2018   FREET4 0.74 06/25/2017   FREET4 0.71 03/25/2017   FREET4 0.58 (L) 02/05/2017   FREET4 0.66 07/09/2016   FREET4 0.67 12/28/2015   T3FREE 3.8 09/21/2018   T3FREE 3.3 08/06/2018   T3FREE 3.7 06/22/2018   T3FREE 3.3 01/12/2018   T3FREE 4.1 06/25/2017   T3FREE 3.0 03/25/2017   T3FREE 2.3 02/05/2017   T3FREE 2.6 07/09/2016   T3FREE 4.0 12/28/2015   T3FREE 3.0 08/06/2015   Her TPO antibodies were elevated: Component     Latest Ref Rng & Units 06/22/2018  Thyroglobulin Ab     < or = 1 IU/mL <1  Thyroperoxidase Ab SerPl-aCnc     <9 IU/mL 822 (H)   She started Selenium 200 mcg daily - 07/2018.  At last visit, she described: fatigue, cold intolerance, constipation, hair loss. These improved after switching to Armour, except hair loss.  Pt denies: - feeling nodules in neck - hoarseness - dysphagia - choking - SOB with lying down  She has no FH of thyroid disorders. No FH of thyroid cancer. No h/o radiation tx to head or neck.  No seaweed or kelp. No recent contrast studies. No herbal  supplements. No Biotin use. + recent steroids use - taper 30 >> will stop tonight - for allergies.  Pt. also has a history of GERD, multiple pregnancy losses, low vitamin B-12 and D, and also low testosterone.  ROS: Constitutional: no weight gain/no weight loss, no fatigue, no subjective hyperthermia, no subjective hypothermia Eyes: no blurry vision, no xerophthalmia ENT: no sore throat, .xtbas Cardiovascular: no CP/no SOB/no palpitations/no leg swelling Respiratory: no cough/no SOB/no wheezing, + congestion Gastrointestinal: no N/no V/no D/no C/no acid reflux Musculoskeletal: no muscle aches/no joint aches Skin: no rashes, + hair loss Neurological:  no tremors/no numbness/no tingling/no dizziness  I reviewed pt's medications, allergies, PMH, social hx, family hx, and changes were documented in the history of present illness. Otherwise, unchanged from my initial visit note.  Past Medical History:  Diagnosis Date  . Allergy   . G E R D 02/22/2007  . Genital warts   . Hemorrhoids, internal 01/31/2011  . Hypothyroidism   . Migraine    Past Surgical History:  Procedure Laterality Date  . APPENDECTOMY    . DILATION AND CURETTAGE OF UTERUS    . miscarriage     x3--required d and c   Social History   Socioeconomic History  . Marital status: Married    Spouse name: Not on file  . Number of children: Not on file  . Years of education: Not on file  . Highest education level: Not on file  Occupational History  . Not on file  Social Needs  . Financial resource strain: Not on file  . Food insecurity:    Worry: Not on file    Inability: Not on file  . Transportation needs:    Medical: Not on file    Non-medical: Not on file  Tobacco Use  . Smoking status: Former Smoker    Last attempt to quit: 01/31/1996    Years since quitting: 22.7  . Smokeless tobacco: Never Used  Substance and Sexual Activity  . Alcohol use: Yes    Alcohol/week: 0.0 standard drinks    Comment: occ  . Drug use: No  . Sexual activity: Yes  Lifestyle  . Physical activity:    Days per week: Not on file    Minutes per session: Not on file  . Stress: Not on file  Relationships  . Social connections:    Talks on phone: Not on file    Gets together: Not on file    Attends religious service: Not on file    Active member of club or organization: Not on file    Attends meetings of clubs or organizations: Not on file    Relationship status: Not on file  . Intimate partner violence:    Fear of current or ex partner: Not on file    Emotionally abused: Not on file    Physically abused: Not on file    Forced sexual activity: Not on file  Other Topics Concern   . Not on file  Social History Narrative  . Not on file   Current Outpatient Medications on File Prior to Visit  Medication Sig Dispense Refill  . ARMOUR THYROID 90 MG tablet Take 1 tablet (90 mg total) by mouth daily. 45 tablet 3  . aspirin EC 81 MG tablet Take 81 mg daily by mouth.    Marland Kitchen. CALCIUM PO Take 1 tablet daily with lunch by mouth.    . Cholecalciferol (VITAMIN D PO) Take 500 Units daily by mouth.    .Marland Kitchen  Cyanocobalamin (VITAMIN B-12 PO) Take 1 tablet daily with lunch by mouth.    . Fexofenadine HCl (ALLEGRA PO) Take 1 tablet daily as needed by mouth (seasonal allergies).    . fluticasone (FLONASE) 50 MCG/ACT nasal spray Place 2 sprays into both nostrils daily. 16 g 0  . meloxicam (MOBIC) 15 MG tablet Take 1 tablet (15 mg total) by mouth daily. 30 tablet 3  . Multiple Vitamin (MULTIVITAMIN WITH MINERALS) TABS tablet Take 1 tablet daily with lunch by mouth.    . predniSONE (DELTASONE) 10 MG tablet Take 3 tabs on days 1-2, take 2 tabs on days 3-4, take 1 tab on days 5-6 12 tablet 0  . Pyridoxine HCl (VITAMIN B-6 PO) Take 1 tablet daily with lunch by mouth.     No current facility-administered medications on file prior to visit.    No Known Allergies Family History  Problem Relation Age of Onset  . Stroke Mother   . Arthritis Mother   . Hypertension Father   . Arthritis Father   . Hyperlipidemia Father   . Cancer Maternal Aunt        ovarian  . Arthritis Maternal Grandmother   . Arthritis Maternal Grandfather   . Arthritis Paternal Grandmother   . Arthritis Paternal Grandfather   . Cancer Maternal Aunt        Breast  . Cancer Maternal Aunt        Lung  . Diabetes Neg Hx        family    PE: LMP 03/11/2013  Wt Readings from Last 3 Encounters:  06/22/18 195 lb (88.5 kg)  03/23/18 202 lb (91.6 kg)  02/02/18 204 lb (92.5 kg)   Constitutional:  in NAD  The physical exam was not performed (virtual visit).  ASSESSMENT: 1. Hypothyroidism -Due to Hashimoto's  thyroiditis  PLAN:  1. Patient with longstanding hypothyroidism, previously on levothyroxine and liothyronine, currently on Armour, started at last visit.  At that time, she was euthyroid but had several complaints possibly related to hypothyroidism: Fatigue, constipation, feeling cold.  We initially started 60 mg of Armour daily, however, her TSH was elevated so we increased to 90 mg in 09/2018.  She is feeling better on this dose, without palpitations. - we discussed about taking the thyroid hormone every day, with water, >30 minutes before breakfast, separated by >4 hours from acid reflux medications, calcium, iron, multivitamins. Pt. is taking it correctly. - will check thyroid tests when safe to come to the clinic: TSH, free T3, fT4, TPO Abs. -At last visit, we checked her thyroid antibodies and the TPO antibodies were elevated, confirming Hashimoto's thyroiditis.  She does have a history of this per review of the chart.  At that time, we discussed about potential use of selenium supplement >> she started 200 mcg daily. She also previously changed her diet to limiting gluten, nuts, dairy and we discussed that these are all positive changes.  -I will see her back in 6 months  - time spent with the patient: 15 min, of which >50% was spent in obtaining information about her symptoms, reviewing her previous labs, evaluations, and treatments, counseling her about her condition (please see the discussed topics above), and developing a plan to further investigate and treat it.  Component     Latest Ref Rng & Units 11/10/2018  TSH     0.35 - 4.50 uIU/mL 1.26  T4,Free(Direct)     0.60 - 1.60 ng/dL 6.06 (L)  Triiodothyronine,Free,Serum  2.3 - 4.2 pg/mL 2.8  TFTs are normal. Except for slightly low free T4.  We will continue the current dose of Armour.  TPO antibodies are still pending.  Carlus Pavlov, MD PhD [facility]

## 2018-10-26 NOTE — Patient Instructions (Signed)
Please continue Armour 90 mg daily.  Take the thyroid hormone every day, with water, at least 30 minutes before breakfast, separated by at least 4 hours from: - acid reflux medications - calcium - iron - multivitamins  Please return for labs as soon as safe.  Please come for another appointment in 6 months.

## 2018-10-28 ENCOUNTER — Encounter: Payer: Self-pay | Admitting: Internal Medicine

## 2018-11-09 ENCOUNTER — Other Ambulatory Visit: Payer: 59

## 2018-11-10 ENCOUNTER — Other Ambulatory Visit (INDEPENDENT_AMBULATORY_CARE_PROVIDER_SITE_OTHER): Payer: 59

## 2018-11-10 DIAGNOSIS — E063 Autoimmune thyroiditis: Secondary | ICD-10-CM

## 2018-11-10 DIAGNOSIS — E038 Other specified hypothyroidism: Secondary | ICD-10-CM | POA: Diagnosis not present

## 2018-11-10 LAB — T3, FREE: T3, Free: 2.8 pg/mL (ref 2.3–4.2)

## 2018-11-10 LAB — T4, FREE: Free T4: 0.56 ng/dL — ABNORMAL LOW (ref 0.60–1.60)

## 2018-11-10 LAB — TSH: TSH: 1.26 u[IU]/mL (ref 0.35–4.50)

## 2018-11-11 LAB — THYROID PEROXIDASE ANTIBODY: Thyroperoxidase Ab SerPl-aCnc: 770 IU/mL — ABNORMAL HIGH (ref <9)

## 2018-11-18 ENCOUNTER — Ambulatory Visit (INDEPENDENT_AMBULATORY_CARE_PROVIDER_SITE_OTHER): Payer: 59 | Admitting: Podiatry

## 2018-11-18 ENCOUNTER — Other Ambulatory Visit: Payer: Self-pay

## 2018-11-18 ENCOUNTER — Encounter: Payer: Self-pay | Admitting: Podiatry

## 2018-11-18 ENCOUNTER — Telehealth: Payer: Self-pay | Admitting: Podiatry

## 2018-11-18 VITALS — Temp 97.7°F

## 2018-11-18 DIAGNOSIS — M722 Plantar fascial fibromatosis: Secondary | ICD-10-CM | POA: Diagnosis not present

## 2018-11-18 MED ORDER — MELOXICAM 15 MG PO TABS: 15.0000 mg | ORAL_TABLET | Freq: Every day | ORAL | 3 refills | Status: DC

## 2018-11-18 NOTE — Telephone Encounter (Signed)
Pt ahs a question about the $95 bill she received in the mail. I saw that it was from an Xray she had in Nov 2019. She wanted further explanation  as to why it took so long to bill her for this. Please call when possible.

## 2018-11-18 NOTE — Progress Notes (Signed)
She presents today for follow-up of her bilateral heel pain.  States the right 1 has really never quit hurting 100% but it got a lot better.  Now the left one are starting to bother me.  She stopped taking the Mobic.  Objective: Vital signs are stable she is alert and oriented x3.  Pulses are palpable.  Neurologic sensorium is intact.  Deep tendon reflexes are intact.  Muscle strength is normal symmetrical.  She has pain on palpation medial [place] tubercle of the right heel similarly on the left heel at the plantar fascial cannula insertion site.  Assessment: Residual plantar fasciitis right start of the plantar fasciitis left.  Plan: Injected both areas today with 20 mg Kenalog 5 mg Marcaine point maximal tenderness bilateral heels.  Tolerated procedure well without complications.  Instructed her to get started back on her meloxicam.  Also instructed her to consider a different pair of orthotics.

## 2018-12-06 ENCOUNTER — Ambulatory Visit (INDEPENDENT_AMBULATORY_CARE_PROVIDER_SITE_OTHER): Payer: 59 | Admitting: Gastroenterology

## 2018-12-06 ENCOUNTER — Encounter: Payer: Self-pay | Admitting: Gastroenterology

## 2018-12-06 ENCOUNTER — Other Ambulatory Visit: Payer: Self-pay

## 2018-12-06 VITALS — Ht 70.0 in | Wt 185.0 lb

## 2018-12-06 DIAGNOSIS — Z1211 Encounter for screening for malignant neoplasm of colon: Secondary | ICD-10-CM

## 2018-12-06 NOTE — Progress Notes (Signed)
This patient contacted our office requesting a physician telemedicine consultation regarding clinical questions and/or test results. Interactive audio and video telecommunications were attempted between this provider and the patient.  However, this technology failed due to the patient having technical difficulties OR they did not have access to video capabilities.  We continued and completed the visit with audio only.   Participants on the conference : myself and patient   The patient consented to this consultation and was aware that a charge will be placed through their insurance.  I was in my office and the patient was at home   Encounter time:  Total time 20 minutes, with 14 minutes spent with patient on phone   _____________________________________________________________________________________________              [facility] Consult [facility]:  History: Kelly RocheMelinda M Tran 12/06/2018  Referring provider: Lorre MunroeBaity, Regina W, NP  Reason for consult/chief complaint: discuss a colonoscopy   Subjective  HPI: Last colonoscopy February 2010 with Dr. Jarold MottoPatterson for constipation and rectal bleeding.  External hemorrhoids were noted, the colon was also described as redundant and tortuous. (patient reports it was very uncomfortable exam)  Diet and occasional miralax seems to control constipation.  No bleeding. Occ'l heartburn controlled with diet, no dysphagia or vomiting.    ROS:  Review of Systems No chest pain, dyspnea, or dysuria  Past Medical History: Past Medical History:  Diagnosis Date  . Allergy   . G E R D 02/22/2007  . Genital warts   . Hemorrhoids, internal 01/31/2011  . Hypothyroidism   . Migraine      Past Surgical History: Past Surgical History:  Procedure Laterality Date  . APPENDECTOMY    . DILATION AND CURETTAGE OF UTERUS    . miscarriage     x3--required d and c     Family History: Family History  Problem Relation Age of Onset  .  Stroke Mother   . Arthritis Mother   . Hypertension Father   . Arthritis Father   . Hyperlipidemia Father   . Cancer Maternal Aunt        ovarian  . Arthritis Maternal Grandmother   . Arthritis Maternal Grandfather   . Arthritis Paternal Grandmother   . Arthritis Paternal Grandfather   . Cancer Maternal Aunt        Breast  . Cancer Maternal Aunt        Lung  . Diabetes Neg Hx        family  . Stomach cancer Neg Hx   . Colon cancer Neg Hx   . Pancreatic cancer Neg Hx    No CRC  Social History: Social History   Socioeconomic History  . Marital status: Married    Spouse name: Not on file  . Number of children: Not on file  . Years of education: Not on file  . Highest education level: Not on file  Occupational History  . Not on file  Social Needs  . Financial resource strain: Not on file  . Food insecurity:    Worry: Not on file    Inability: Not on file  . Transportation needs:    Medical: Not on file    Non-medical: Not on file  Tobacco Use  . Smoking status: Former Smoker    Last attempt to quit: 01/31/1996    Years since quitting: 22.8  . Smokeless tobacco: Never Used  Substance and Sexual Activity  . Alcohol use: Yes    Alcohol/week: 0.0 standard drinks  Comment: occ  . Drug use: No  . Sexual activity: Yes    Partners: Male  Lifestyle  . Physical activity:    Days per week: Not on file    Minutes per session: Not on file  . Stress: Not on file  Relationships  . Social connections:    Talks on phone: Not on file    Gets together: Not on file    Attends religious service: Not on file    Active member of club or organization: Not on file    Attends meetings of clubs or organizations: Not on file    Relationship status: Not on file  Other Topics Concern  . Not on file  Social History Narrative  . 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 ARMOUR THYROID 90 MG tablet Take 1 tablet (90 mg total) by mouth daily. 45 tablet 3  . aspirin EC 81 MG tablet Take 81 mg daily by mouth.    Marland Kitchen CALCIUM PO Take 1 tablet daily with lunch by mouth.    . Cholecalciferol (VITAMIN D PO) Take 500 Units daily by mouth.    . Cyanocobalamin (VITAMIN B-12 PO) Take 1 tablet daily with lunch by mouth.    . Fexofenadine HCl (ALLEGRA PO) Take 1 tablet daily as needed by mouth (seasonal allergies).    . fluticasone (FLONASE) 50 MCG/ACT nasal spray Place 2 sprays into both nostrils daily. 16 g 0  . meloxicam (MOBIC) 15 MG tablet Take 1 tablet (15 mg total) by mouth daily. 30 tablet 3  . Multiple Vitamin (MULTIVITAMIN WITH MINERALS) TABS tablet Take 1 tablet daily with lunch by mouth.    . Pyridoxine HCl (VITAMIN B-6 PO) Take 1 tablet daily with lunch by mouth.     No current facility-administered medications for this visit.       ___________________________________________________________________ Objective   No exam -virtual visit  Assessment: Encounter Diagnosis  Name Primary?  . Special screening for malignant neoplasms, colon Yes    She was concerned because of the discomfort with her last colonoscopy.  I reassured her that she will receive propofol sedation, and I believe we can keep her much more comfortably sedated for this exam.  Plan:  My office will contact her to schedule a screening colonoscopy.  Thank you for the courtesy of this consult.  Please call me with any questions or concerns.  Charlie Pitter III  CC: Referring provider noted above

## 2018-12-07 ENCOUNTER — Other Ambulatory Visit: Payer: Self-pay

## 2018-12-08 ENCOUNTER — Other Ambulatory Visit: Payer: Self-pay

## 2018-12-08 DIAGNOSIS — Z1211 Encounter for screening for malignant neoplasm of colon: Secondary | ICD-10-CM

## 2018-12-08 MED ORDER — NA SULFATE-K SULFATE-MG SULF 17.5-3.13-1.6 GM/177ML PO SOLN: 1.0000 | Freq: Once | ORAL | 0 refills | Status: AC

## 2018-12-16 ENCOUNTER — Other Ambulatory Visit (HOSPITAL_COMMUNITY): Payer: 59

## 2018-12-23 ENCOUNTER — Encounter: Payer: Self-pay | Admitting: Podiatry

## 2018-12-23 ENCOUNTER — Ambulatory Visit: Payer: 59 | Admitting: Podiatry

## 2018-12-23 ENCOUNTER — Ambulatory Visit (INDEPENDENT_AMBULATORY_CARE_PROVIDER_SITE_OTHER): Payer: 59 | Admitting: Podiatry

## 2018-12-23 ENCOUNTER — Other Ambulatory Visit: Payer: Self-pay

## 2018-12-23 VITALS — Temp 98.1°F

## 2018-12-23 DIAGNOSIS — M722 Plantar fascial fibromatosis: Secondary | ICD-10-CM | POA: Diagnosis not present

## 2018-12-23 NOTE — Progress Notes (Signed)
She presents today for follow-up of her bilateral heel pains.  She states them still having problems even though they will hurt as much they did get somewhat better but the left one have noticed it started to burn and hurt up into the arch.  She states that the meloxicam does not really seem to be doing much for her.  Objective: Vital signs are stable she is alert and oriented x3.  Pulses are palpable.  Continues to wear her orthotics regular basis.  She has pain on palpation medial calcaneal tubercles bilateral.  Left is worse.  Radiates up into the mid arch.  Assessment: Plantar fasciitis bilateral left greater than right.  Plan: At this point were going to change her meloxicam to Celebrex 100 mg twice daily and also reinjected her with Celestone dexamethasone and Kenalog with local anesthetic.  She tolerated the procedure well without complications.  I will follow-up with her in the near future for reevaluation discussed the possibility of the need of an MRI as well as physical therapy.

## 2018-12-24 ENCOUNTER — Telehealth: Payer: Self-pay | Admitting: Podiatry

## 2018-12-24 MED ORDER — CELECOXIB 100 MG PO CAPS: 100.0000 mg | ORAL_CAPSULE | Freq: Two times a day (BID) | ORAL | 0 refills | Status: DC

## 2018-12-24 NOTE — Telephone Encounter (Signed)
Pt was suppose to receive some anti-inflammatory medication at her pharmacy at [facility] in [place]. Please give patient a call

## 2018-12-24 NOTE — Telephone Encounter (Signed)
Left message informing pt the medication had been sent to the [facility].

## 2018-12-24 NOTE — Addendum Note (Signed)
Addended by: Harriett Sine D on: 12/24/2018 12:36 PM   Modules accepted: Orders

## 2019-01-20 ENCOUNTER — Encounter: Payer: 59 | Admitting: Gastroenterology

## 2019-01-27 ENCOUNTER — Ambulatory Visit: Payer: 59 | Admitting: Podiatry

## 2019-01-31 ENCOUNTER — Other Ambulatory Visit: Payer: Self-pay | Admitting: Podiatry

## 2019-02-08 ENCOUNTER — Other Ambulatory Visit: Payer: Self-pay

## 2019-02-08 ENCOUNTER — Encounter: Payer: Self-pay | Admitting: Podiatry

## 2019-02-08 ENCOUNTER — Ambulatory Visit (INDEPENDENT_AMBULATORY_CARE_PROVIDER_SITE_OTHER): Payer: 59 | Admitting: Podiatry

## 2019-02-08 VITALS — Temp 98.3°F

## 2019-02-08 DIAGNOSIS — M76822 Posterior tibial tendinitis, left leg: Secondary | ICD-10-CM

## 2019-02-08 DIAGNOSIS — M76821 Posterior tibial tendinitis, right leg: Secondary | ICD-10-CM

## 2019-02-08 DIAGNOSIS — R252 Cramp and spasm: Secondary | ICD-10-CM | POA: Diagnosis not present

## 2019-02-08 DIAGNOSIS — M722 Plantar fascial fibromatosis: Secondary | ICD-10-CM

## 2019-02-08 MED ORDER — CYCLOBENZAPRINE HCL 10 MG PO TABS: 10.0000 mg | ORAL_TABLET | Freq: Every day | ORAL | 0 refills | Status: DC

## 2019-02-08 MED ORDER — CELECOXIB 200 MG PO CAPS: 200.0000 mg | ORAL_CAPSULE | Freq: Every day | ORAL | 1 refills | Status: DC

## 2019-02-08 NOTE — Progress Notes (Signed)
She presents today for follow-up of her bilateral heels.  States that they are better but I am having some terrible cramping at night.  I am having more pain of the inside of my heel and ankle.  Objective: Vital signs are stable alert and oriented x3.  Pulses are palpable.  She has pain on palpation medial calcaneal tubercles bilaterally but is considerably better than previously noted.  Mild tenderness on palpation posterior tibial tendon.  Assessment: Resolving plantar fasciitis with mild posterior tibial tendinitis.  Plan: Discussed etiology pathology and surgical therapies at this point in time started her on cyclobenzaprine and she will continue orthotics.

## 2019-02-10 ENCOUNTER — Encounter: Payer: 59 | Admitting: Gastroenterology

## 2019-03-14 ENCOUNTER — Other Ambulatory Visit: Payer: Self-pay | Admitting: Internal Medicine

## 2019-03-24 ENCOUNTER — Ambulatory Visit: Payer: 59 | Admitting: Podiatry

## 2019-04-15 ENCOUNTER — Other Ambulatory Visit: Payer: Self-pay | Admitting: Podiatry

## 2019-04-28 ENCOUNTER — Encounter: Payer: Self-pay | Admitting: Gastroenterology

## 2019-05-11 ENCOUNTER — Other Ambulatory Visit: Payer: Self-pay

## 2019-05-11 DIAGNOSIS — Z20822 Contact with and (suspected) exposure to covid-19: Secondary | ICD-10-CM

## 2019-05-12 LAB — NOVEL CORONAVIRUS, NAA: SARS-CoV-2, NAA: NOT DETECTED

## 2019-05-13 ENCOUNTER — Telehealth: Payer: Self-pay | Admitting: General Practice

## 2019-05-13 NOTE — Telephone Encounter (Signed)
Negative COVID results given. Patient results "NOT Detected." Caller expressed understanding. ° °

## 2019-05-24 ENCOUNTER — Telehealth: Payer: Self-pay

## 2019-05-24 NOTE — Telephone Encounter (Signed)
Patient called in requesting appt for labs before appt is that something I need to schedule or she can wait till visit    Please advise

## 2019-05-24 NOTE — Telephone Encounter (Signed)
Dr. Cruzita Lederer prefers to have the patient wait until the appointment with her before doing labs. Thank you!

## 2019-05-24 NOTE — Telephone Encounter (Signed)
Called patient and informed her of Dr message

## 2019-05-26 ENCOUNTER — Ambulatory Visit (AMBULATORY_SURGERY_CENTER): Payer: 59 | Admitting: *Deleted

## 2019-05-26 ENCOUNTER — Other Ambulatory Visit: Payer: Self-pay

## 2019-05-26 VITALS — Temp 96.2°F | Ht 69.0 in | Wt 202.4 lb

## 2019-05-26 DIAGNOSIS — Z1211 Encounter for screening for malignant neoplasm of colon: Secondary | ICD-10-CM

## 2019-05-26 DIAGNOSIS — Z1159 Encounter for screening for other viral diseases: Secondary | ICD-10-CM

## 2019-05-26 NOTE — Progress Notes (Addendum)
Patient states she has Suprep already due to recently cancelled Colonoscopy.   No egg or soy allergy known to patient  No issues with past sedation with any surgeries  or procedures, no intubation problems  No diet pills per patient No home 02 use per patient  No blood thinners per patient  Pt denies issues with constipation  No A fib or A flutter  EMMI video sent to pt's e mail   Due to the COVID-19 pandemic we are asking patients to follow these guidelines. Please only bring one care partner. Please be aware that your care partner may wait in the car in the parking lot or if they feel like they will be too hot to wait in the car, they may wait in the lobby on the 4th floor. All care partners are required to wear a mask the entire time (we do not have any that we can provide them), they need to practice social distancing, and we will do a Covid check for all patient's and care partners when you arrive. Also we will check their temperature and your temperature. If the care partner waits in their car they need to stay in the parking lot the entire time and we will call them on their cell phone when the patient is ready for discharge so they can bring the car to the front of the building. Also all patient's will need to wear a mask into building.

## 2019-05-27 ENCOUNTER — Encounter: Payer: Self-pay | Admitting: Internal Medicine

## 2019-05-27 ENCOUNTER — Ambulatory Visit (INDEPENDENT_AMBULATORY_CARE_PROVIDER_SITE_OTHER): Payer: 59 | Admitting: Internal Medicine

## 2019-05-27 ENCOUNTER — Encounter: Payer: Self-pay | Admitting: Gastroenterology

## 2019-05-27 VITALS — Temp 97.9°F

## 2019-05-27 DIAGNOSIS — B9789 Other viral agents as the cause of diseases classified elsewhere: Secondary | ICD-10-CM

## 2019-05-27 DIAGNOSIS — J329 Chronic sinusitis, unspecified: Secondary | ICD-10-CM | POA: Diagnosis not present

## 2019-05-27 MED ORDER — PREDNISONE 10 MG PO TABS: ORAL_TABLET | ORAL | 0 refills | Status: DC

## 2019-05-27 NOTE — Progress Notes (Signed)
Virtual Visit via Video Note  I connected with Kelly Tran on 05/27/19 at  4:00 PM EST by a video enabled telemedicine application and verified that I am speaking with the correct person using two identifiers.  Location: Patient: Home Provider:  Office   I discussed the limitations of evaluation and management by telemedicine and the availability of in person appointments. The patient expressed understanding and agreed to proceed.  History of Present Illness:  Patient reports nasal congestion, sneezing, facial pressure HA and dry cough for 2-3 days. She reports nasal discharge and describes it as clear mucus. She reports her cough as a dry cough that occurs mostly at night. Her headache is mostly toward the front of her face and it feels like pressure pain. She reports it feels like allergies. She has tried Flonase, Careers adviserAllegra, Afrin, and Sudafed PE with minimal relief. She reports a history of allergies in the past for which she usually takes Claritin, Sports coachAllegra and Flonase. She denies fevers, CP, SOB, GI symptoms or sick contacts.     Past Medical History:  Diagnosis Date  . Allergy   . G E R D 02/22/2007  . Genital warts   . Hemorrhoids, internal 01/31/2011  . Hypothyroidism   . Migraine     Current Outpatient Medications  Medication Sig Dispense Refill  . ARMOUR THYROID 90 MG tablet TAKE 1 TABLET BY MOUTH EVERY DAY 30 tablet 5  . aspirin EC 81 MG tablet Take 81 mg daily by mouth.    Marland Kitchen. CALCIUM PO Take 1 tablet daily with lunch by mouth.    . celecoxib (CELEBREX) 200 MG capsule TAKE 1 CAPSULE BY MOUTH EVERY DAY 30 capsule 1  . Cholecalciferol (VITAMIN D PO) Take 500 Units daily by mouth.    . Cyanocobalamin (VITAMIN B-12 PO) Take 1 tablet daily with lunch by mouth.    . cyclobenzaprine (FLEXERIL) 10 MG tablet Take 1 tablet (10 mg total) by mouth at bedtime. 30 tablet 0  . Fexofenadine HCl (ALLEGRA PO) Take 1 tablet daily as needed by mouth (seasonal allergies).    . fluticasone  (FLONASE) 50 MCG/ACT nasal spray Place 2 sprays into both nostrils daily. 16 g 0  . loratadine (CLARITIN) 10 MG tablet Take 10 mg by mouth daily.    . meloxicam (MOBIC) 15 MG tablet Take 1 tablet (15 mg total) by mouth daily. (Patient not taking: Reported on 05/26/2019) 30 tablet 3  . Multiple Vitamin (MULTIVITAMIN WITH MINERALS) TABS tablet Take 1 tablet daily with lunch by mouth.    . Pyridoxine HCl (VITAMIN B-6 PO) Take 1 tablet daily with lunch by mouth.     No current facility-administered medications for this visit.     No Known Allergies  Family History  Problem Relation Age of Onset  . Stroke Mother   . Arthritis Mother   . Hypertension Father   . Arthritis Father   . Hyperlipidemia Father   . Cancer Maternal Aunt        ovarian  . Arthritis Maternal Grandmother   . Arthritis Maternal Grandfather   . Arthritis Paternal Grandmother   . Arthritis Paternal Grandfather   . Cancer Maternal Aunt        Breast  . Cancer Maternal Aunt        Lung  . Diabetes Neg Hx        family  . Stomach cancer Neg Hx   . Colon cancer Neg Hx   . Pancreatic cancer Neg Hx   .  Esophageal cancer Neg Hx   . Rectal cancer Neg Hx     Social History   Socioeconomic History  . Marital status: Married    Spouse name: Not on file  . Number of children: Not on file  . Years of education: Not on file  . Highest education level: Not on file  Occupational History  . Not on file  Social Needs  . Financial resource strain: Not on file  . Food insecurity    Worry: Not on file    Inability: Not on file  . Transportation needs    Medical: Not on file    Non-medical: Not on file  Tobacco Use  . Smoking status: Former Smoker    Quit date: 01/31/1996    Years since quitting: 23.3  . Smokeless tobacco: Never Used  Substance and Sexual Activity  . Alcohol use: Yes    Alcohol/week: 0.0 standard drinks    Comment: occ  . Drug use: No  . Sexual activity: Yes    Partners: Male  Lifestyle  .  Physical activity    Days per week: Not on file    Minutes per session: Not on file  . Stress: Not on file  Relationships  . Social Herbalist on phone: Not on file    Gets together: Not on file    Attends religious service: Not on file    Active member of club or organization: Not on file    Attends meetings of clubs or organizations: Not on file    Relationship status: Not on file  . Intimate partner violence    Fear of current or ex partner: Not on file    Emotionally abused: Not on file    Physically abused: Not on file    Forced sexual activity: Not on file  Other Topics Concern  . Not on file  Social History Narrative  . Not on file     Constitutional: Pt reports headache and malaise. Denies fever or abrupt weight changes.  HEENT: Pt reports nasal congestion, discharge, sneezing. Denies runny nose or sore throat.  Respiratory: Pt reports dry cough. Denies difficulty breathing, shortness of breath or sputum production.   Cardiovascular: Denies chest pain, chest tightness, palpitations or swelling in the hands or feet.  Gastrointestinal: Denies abdominal pain, bloating, constipation, diarrhea or blood in the stool.   No other specific complaints in a complete review of systems (except as listed in HPI above).  Observations/Objective:  LMP 03/11/2013  Wt Readings from Last 3 Encounters:  05/26/19 202 lb 6.4 oz (91.8 kg)  12/06/18 185 lb (83.9 kg)  06/22/18 195 lb (88.5 kg)    General: Appears her stated age, obese, in NAD. HEENT: Sounds congested during conversation. Nose is erythematous at the base of bilateral nostrils.   Pulmonary/Chest: Normal effort and positive vesicular breath sounds. No respiratory distress. No cough during conversation.   BMET    Component Value Date/Time   NA 139 05/21/2018 0907   K 4.3 05/21/2018 0907   CL 101 05/21/2018 0907   CO2 29 05/21/2018 0907   GLUCOSE 91 05/21/2018 0907   BUN 20 05/21/2018 0907   CREATININE 0.82  05/21/2018 0907   CALCIUM 10.1 05/21/2018 0907   GFRNONAA >60 05/23/2017 0222   GFRAA >60 05/23/2017 0222    Lipid Panel     Component Value Date/Time   CHOL 161 05/21/2018 0907   TRIG 44.0 05/21/2018 0907   HDL 90.50 05/21/2018 0907  CHOLHDL 2 05/21/2018 0907   VLDL 8.8 05/21/2018 0907   LDLCALC 61 05/21/2018 0907    CBC    Component Value Date/Time   WBC 5.9 05/21/2018 0907   RBC 4.72 05/21/2018 0907   HGB 14.4 05/21/2018 0907   HCT 41.9 05/21/2018 0907   PLT 303.0 05/21/2018 0907   MCV 88.6 05/21/2018 0907   MCH 29.7 05/22/2017 2125   MCHC 34.3 05/21/2018 0907   RDW 12.5 05/21/2018 0907   LYMPHSABS 3.2 05/03/2013 1127   MONOABS 0.7 05/03/2013 1127   EOSABS 0.2 05/03/2013 1127   BASOSABS 0.0 05/03/2013 1127    Hgb A1C No results found for: HGBA1C     Assessment and Plan:  Viral sinusitis:  RX for Pred taper X 6 days Stop Sudafed and Flonase for now Continue with Allegra and Claritin Continue with sinus rinse Return precautions given   Follow Up Instructions:    I discussed the assessment and treatment plan with the patient. The patient was provided an opportunity to ask questions and all were answered. The patient agreed with the plan and demonstrated an understanding of the instructions.   The patient was advised to call back or seek an in-person evaluation if the symptoms worsen or if the condition fails to improve as anticipated.     Nicki Reaper, NP

## 2019-05-27 NOTE — Patient Instructions (Signed)

## 2019-05-31 ENCOUNTER — Encounter: Payer: Self-pay | Admitting: Internal Medicine

## 2019-05-31 ENCOUNTER — Ambulatory Visit (INDEPENDENT_AMBULATORY_CARE_PROVIDER_SITE_OTHER): Payer: 59 | Admitting: Internal Medicine

## 2019-05-31 DIAGNOSIS — L659 Nonscarring hair loss, unspecified: Secondary | ICD-10-CM

## 2019-05-31 DIAGNOSIS — E063 Autoimmune thyroiditis: Secondary | ICD-10-CM

## 2019-05-31 DIAGNOSIS — E038 Other specified hypothyroidism: Secondary | ICD-10-CM

## 2019-05-31 NOTE — Progress Notes (Signed)
Patient ID: Kelly Tran, female   DOB: 03-24-1966, 53 y.o.   MRN: [id number]   Patient location: Work My location: Office  Referring Provider: Lorre Munroe, NP  I connected with the patient on 05/31/19 at  2:18 PM EST by a video enabled telemedicine application and verified that I am speaking with the correct person.   I discussed the limitations of evaluation and management by telemedicine and the availability of in person appointments. The patient expressed understanding and agreed to proceed.   Details of the encounter are shown below.  HPI  JONIYAH Tran is a 53 y.o.-year-old female, initially referred by her PCP, Lorre Munroe, NP, returning for follow-up for Hashimoto's hypothyroidism.  He previously saw Dr. Lucianne Muss, last visit 01/2018.  I first saw the patient in 07/2018.  Last visit with me 7 months ago (virtual)  Reviewed and addended history: Pt. has been dx with hypothyroidism in 11/2011.  She was initially started on levothyroxine, but she did not tolerate this well.  She then was changed to Armour in 07/2012 and 45 mg daily, then increase to 60 mg daily in 06/2015.  However, she developed palpitations (stopped after stopping amitriptyline).  Her medication was changed to levothyroxine 50 mcg + liothyronine 10 mg daily in 02/2017.  This was increased to 8 tablets of levothyroxine and 14 tablets of liothyronine a week in 05/2018.  At that time, TSH was 3.77.  However, she was complaining of fatigue, cold intolerance, hair loss, and constipation and we switched to Armour thyroid.  Her symptoms improved on this, with the exception of hair loss.  She continues on Armour 90 mg daily: - in am - fasting - at least 30 min from b'fast - + Ca, MVI at night - no PPIs, Fe - not on Biotin  Reviewed her TFTs: Lab Results  Component Value Date   TSH 1.26 11/10/2018   TSH 6.91 (H) 09/21/2018   TSH 7.30 (H) 08/06/2018   TSH 0.84 06/22/2018   TSH 3.77 05/21/2018   TSH 2.41  02/02/2018   TSH 2.08 01/12/2018   TSH 2.02 06/25/2017   TSH 1.080 05/23/2017   TSH 3.95 03/25/2017   FREET4 0.56 (L) 11/10/2018   FREET4 0.57 (L) 09/21/2018   FREET4 0.58 (L) 08/06/2018   FREET4 0.77 06/22/2018   FREET4 0.69 05/21/2018   FREET4 0.79 01/12/2018   FREET4 0.74 06/25/2017   FREET4 0.71 03/25/2017   FREET4 0.58 (L) 02/05/2017   FREET4 0.66 07/09/2016   T3FREE 2.8 11/10/2018   T3FREE 3.8 09/21/2018   T3FREE 3.3 08/06/2018   T3FREE 3.7 06/22/2018   T3FREE 3.3 01/12/2018   T3FREE 4.1 06/25/2017   T3FREE 3.0 03/25/2017   T3FREE 2.3 02/05/2017   T3FREE 2.6 07/09/2016   T3FREE 4.0 12/28/2015   Her TPO antibodies were elevated: Component     Latest Ref Rng & Units 06/22/2018 11/10/2018  Thyroglobulin Ab     < or = 1 IU/mL <1   Thyroperoxidase Ab SerPl-aCnc     <9 IU/mL 822 (H) 770 (H)   We started selenium 200 mcg daily in 07/2018.  She also started to improve her diet to reduce gluten, nuts, dairy.  Pt denies: - feeling nodules in neck - hoarseness - dysphagia - choking - SOB with lying down  She has no FH of thyroid disorders. No FH of thyroid cancer. No h/o radiation tx to head or neck.  No seaweed or kelp. No recent contrast studies. No herbal supplements. +  Biotin use -off and on. No recent steroid use.  Pt. also has a history of GERD, multiple pregnancy losses, low vitamin B-12 and D, and also low testosterone.  ROS: Constitutional: + weight gain/no weight loss, no fatigue, no subjective hyperthermia, no subjective hypothermia Eyes: no blurry vision, no xerophthalmia ENT: no sore throat, + see HPI, + congestion, + PND Cardiovascular: no CP/no SOB/no palpitations/no leg swelling Respiratory: + cough/no SOB/no wheezing Gastrointestinal: no N/no V/no D/no C/no acid reflux Musculoskeletal: no muscle aches/no joint aches Skin: no rashes, + hair loss Neurological: no tremors/no numbness/no tingling/no dizziness  I reviewed pt's medications, allergies,  PMH, social hx, family hx, and changes were documented in the history of present illness. Otherwise, unchanged from my initial visit note.  Past Medical History:  Diagnosis Date  . Allergy   . G E R D 02/22/2007  . Genital warts   . Hemorrhoids, internal 01/31/2011  . Hypothyroidism   . Migraine    Past Surgical History:  Procedure Laterality Date  . APPENDECTOMY    . DILATION AND CURETTAGE OF UTERUS    . miscarriage     x3--required d and c   Social History   Socioeconomic History  . Marital status: Married    Spouse name: Not on file  . Number of children: Not on file  . Years of education: Not on file  . Highest education level: Not on file  Occupational History  . Not on file  Social Needs  . Financial resource strain: Not on file  . Food insecurity    Worry: Not on file    Inability: Not on file  . Transportation needs    Medical: Not on file    Non-medical: Not on file  Tobacco Use  . Smoking status: Former Smoker    Quit date: 01/31/1996    Years since quitting: 23.3  . Smokeless tobacco: Never Used  Substance and Sexual Activity  . Alcohol use: Yes    Alcohol/week: 0.0 standard drinks    Comment: occ  . Drug use: No  . Sexual activity: Yes    Partners: Male  Lifestyle  . Physical activity    Days per week: Not on file    Minutes per session: Not on file  . Stress: Not on file  Relationships  . Social Herbalist on phone: Not on file    Gets together: Not on file    Attends religious service: Not on file    Active member of club or organization: Not on file    Attends meetings of clubs or organizations: Not on file    Relationship status: Not on file  . Intimate partner violence    Fear of current or ex partner: Not on file    Emotionally abused: Not on file    Physically abused: Not on file    Forced sexual activity: Not on file  Other Topics Concern  . Not on file  Social History Narrative  . Not on file   Current Outpatient  Medications on File Prior to Visit  Medication Sig Dispense Refill  . ARMOUR THYROID 90 MG tablet TAKE 1 TABLET BY MOUTH EVERY DAY 30 tablet 5  . aspirin EC 81 MG tablet Take 81 mg daily by mouth.    Marland Kitchen CALCIUM PO Take 1 tablet daily with lunch by mouth.    . celecoxib (CELEBREX) 200 MG capsule TAKE 1 CAPSULE BY MOUTH EVERY DAY 30 capsule 1  . Cholecalciferol (  VITAMIN D PO) Take 500 Units daily by mouth.    . Cyanocobalamin (VITAMIN B-12 PO) Take 1 tablet daily with lunch by mouth.    . cyclobenzaprine (FLEXERIL) 10 MG tablet Take 1 tablet (10 mg total) by mouth at bedtime. 30 tablet 0  . Fexofenadine HCl (ALLEGRA PO) Take 1 tablet daily as needed by mouth (seasonal allergies).    . fluticasone (FLONASE) 50 MCG/ACT nasal spray Place 2 sprays into both nostrils daily. 16 g 0  . loratadine (CLARITIN) 10 MG tablet Take 10 mg by mouth daily.    . meloxicam (MOBIC) 15 MG tablet Take 1 tablet (15 mg total) by mouth daily. 30 tablet 3  . Multiple Vitamin (MULTIVITAMIN WITH MINERALS) TABS tablet Take 1 tablet daily with lunch by mouth.    . predniSONE (DELTASONE) 10 MG tablet Take 6 tabs day 1, 5 tabs day 2, 4 tabs day 3, 3 tabs day 4, 2 tabs day 5, 1 tab day 6 21 tablet 0  . Pyridoxine HCl (VITAMIN B-6 PO) Take 1 tablet daily with lunch by mouth.     No current facility-administered medications on file prior to visit.    No Known Allergies Family History  Problem Relation Age of Onset  . Stroke Mother   . Arthritis Mother   . Hypertension Father   . Arthritis Father   . Hyperlipidemia Father   . Cancer Maternal Aunt        ovarian  . Arthritis Maternal Grandmother   . Arthritis Maternal Grandfather   . Arthritis Paternal Grandmother   . Arthritis Paternal Grandfather   . Cancer Maternal Aunt        Breast  . Cancer Maternal Aunt        Lung  . Diabetes Neg Hx        family  . Stomach cancer Neg Hx   . Colon cancer Neg Hx   . Pancreatic cancer Neg Hx   . Esophageal cancer Neg Hx   .  Rectal cancer Neg Hx     PE: LMP 03/11/2013  Wt Readings from Last 3 Encounters:  05/26/19 202 lb 6.4 oz (91.8 kg)  12/06/18 185 lb (83.9 kg)  06/22/18 195 lb (88.5 kg)   Constitutional:  in NAD  The physical exam was not performed (virtual visit).  ASSESSMENT: 1. Hypothyroidism -Due to Hashimoto's thyroiditis  2.  Hair loss  PLAN:  1. Patient with longstanding Hashimoto's hypothyroidism, previously on levothyroxine and liothyronine, currently on Armour.  Before switching to Armour, she had several complaints possibly related to hypothyroidism: Fatigue, constipation, cold intolerance.  We initially started 60 mg of thyroid daily, however, we needed to increase the dose to 90 mg daily in 09/2018.  -She continues on selenium supplement 200 mcg daily.  At last visit we checked her TPO antibodies and they were still elevated but improving.   - latest thyroid labs reviewed with pt >> normal: Lab Results  Component Value Date   TSH 1.26 11/10/2018  - pt feels good on this Armour dose and has no complaints.  Reviewing the chart, she gained almost 20 pounds over the last 5 months - we discussed about taking the thyroid hormones every day, with water, >30 minutes before breakfast, separated by >4 hours from acid reflux medications, calcium, iron, multivitamins. Pt. is taking it correctly. - will check thyroid tests when she returns to the clinic (she is now taking the prednisone taper so I advised her to wait 1 week after  finishing this before returning for labs): TSH, free T3, and fT4 and will also add TPO antibodies to see if we need to continue with selenium or not - If labs are abnormal, she will need to return for repeat TFTs in 1.5 months - Otherwise, I will see her back in a year  2.  Hair loss -Discontinued We discussed about starting a hair skin and nails vitamin but I advised her to stop-any product with biotin before we check thyroid labs due to possible interference  Orders  Placed This Encounter  Procedures  . xtpit - TSH  . xtpit - free T4  . xtpit - free T3  . TPO Ab - thyroid   - time spent with the patient: 15 min, of which >50% was spent in obtaining information about her symptoms, reviewing her previous labs, evaluations, and treatments, counseling her about her condition (please see the discussed topics above), and developing a plan to further investigate and treat it.  Needs 90 days refills.  Carlus Pavlovristina Josie Mesa, MD PhD [facility]

## 2019-05-31 NOTE — Patient Instructions (Signed)
Please continue Armour 90 mg daily.  Take the thyroid hormone every day, with water, at least 30 minutes before breakfast, separated by at least 4 hours from: - acid reflux medications - calcium - iron - multivitamins  Please return for labs as soon as safe.  Please come for another appointment in 1 year

## 2019-06-06 ENCOUNTER — Ambulatory Visit (INDEPENDENT_AMBULATORY_CARE_PROVIDER_SITE_OTHER): Payer: 59

## 2019-06-06 ENCOUNTER — Ambulatory Visit (INDEPENDENT_AMBULATORY_CARE_PROVIDER_SITE_OTHER): Payer: 59 | Admitting: Internal Medicine

## 2019-06-06 ENCOUNTER — Encounter: Payer: Self-pay | Admitting: Internal Medicine

## 2019-06-06 ENCOUNTER — Other Ambulatory Visit: Payer: Self-pay | Admitting: Gastroenterology

## 2019-06-06 DIAGNOSIS — M545 Low back pain, unspecified: Secondary | ICD-10-CM

## 2019-06-06 DIAGNOSIS — M62838 Other muscle spasm: Secondary | ICD-10-CM | POA: Diagnosis not present

## 2019-06-06 DIAGNOSIS — Z1159 Encounter for screening for other viral diseases: Secondary | ICD-10-CM

## 2019-06-06 MED ORDER — CYCLOBENZAPRINE HCL 10 MG PO TABS: 10.0000 mg | ORAL_TABLET | Freq: Three times a day (TID) | ORAL | 0 refills | Status: DC | PRN

## 2019-06-06 NOTE — Patient Instructions (Signed)

## 2019-06-06 NOTE — Progress Notes (Signed)
Virtual Visit via Video Note  I connected with Kelly Tran on 06/06/19 at 12:00 PM EST by a video enabled telemedicine application and verified that I am speaking with the correct person using two identifiers.  Location: Patient: Home Provider: Office   I discussed the limitations of evaluation and management by telemedicine and the availability of in person appointments. The patient expressed understanding and agreed to proceed.  History of Present Illness:  Pt reports muscle spasms in back. This started yesterday. She mentions she got up yesterday morning and felt like she "got kicked in her lower back". She reports the pain feels achy and sharp in nature. Yesterday, she rested and this morning she felt ok. She feels that the right is worse because she feels it is affecting her sciatic nerve. She reports certain back movements make the pain worse. She has tried heating pads and prevously prescribed Flexeril which helped her with sleep. She denies rashes, urinary symptoms, fevers, chills or GI issues.    Past Medical History:  Diagnosis Date  . Allergy   . G E R D 02/22/2007  . Genital warts   . Hemorrhoids, internal 01/31/2011  . Hypothyroidism   . Migraine     Current Outpatient Medications  Medication Sig Dispense Refill  . ARMOUR THYROID 90 MG tablet TAKE 1 TABLET BY MOUTH EVERY DAY 30 tablet 5  . aspirin EC 81 MG tablet Take 81 mg daily by mouth.    Marland Kitchen CALCIUM PO Take 1 tablet daily with lunch by mouth.    . celecoxib (CELEBREX) 200 MG capsule TAKE 1 CAPSULE BY MOUTH EVERY DAY 30 capsule 1  . Cholecalciferol (VITAMIN D PO) Take 500 Units daily by mouth.    . Cyanocobalamin (VITAMIN B-12 PO) Take 1 tablet daily with lunch by mouth.    . cyclobenzaprine (FLEXERIL) 10 MG tablet Take 1 tablet (10 mg total) by mouth at bedtime. 30 tablet 0  . Fexofenadine HCl (ALLEGRA PO) Take 1 tablet daily as needed by mouth (seasonal allergies).    . fluticasone (FLONASE) 50 MCG/ACT nasal  spray Place 2 sprays into both nostrils daily. 16 g 0  . loratadine (CLARITIN) 10 MG tablet Take 10 mg by mouth daily.    . meloxicam (MOBIC) 15 MG tablet Take 1 tablet (15 mg total) by mouth daily. 30 tablet 3  . Multiple Vitamin (MULTIVITAMIN WITH MINERALS) TABS tablet Take 1 tablet daily with lunch by mouth.    . predniSONE (DELTASONE) 10 MG tablet Take 6 tabs day 1, 5 tabs day 2, 4 tabs day 3, 3 tabs day 4, 2 tabs day 5, 1 tab day 6 21 tablet 0  . Pyridoxine HCl (VITAMIN B-6 PO) Take 1 tablet daily with lunch by mouth.     No current facility-administered medications for this visit.     No Known Allergies  Family History  Problem Relation Age of Onset  . Stroke Mother   . Arthritis Mother   . Hypertension Father   . Arthritis Father   . Hyperlipidemia Father   . Cancer Maternal Aunt        ovarian  . Arthritis Maternal Grandmother   . Arthritis Maternal Grandfather   . Arthritis Paternal Grandmother   . Arthritis Paternal Grandfather   . Cancer Maternal Aunt        Breast  . Cancer Maternal Aunt        Lung  . Diabetes Neg Hx        family  .  Stomach cancer Neg Hx   . Colon cancer Neg Hx   . Pancreatic cancer Neg Hx   . Esophageal cancer Neg Hx   . Rectal cancer Neg Hx     Social History   Socioeconomic History  . Marital status: Married    Spouse name: Not on file  . Number of children: Not on file  . Years of education: Not on file  . Highest education level: Not on file  Occupational History  . Not on file  Social Needs  . Financial resource strain: Not on file  . Food insecurity    Worry: Not on file    Inability: Not on file  . Transportation needs    Medical: Not on file    Non-medical: Not on file  Tobacco Use  . Smoking status: Former Smoker    Quit date: 01/31/1996    Years since quitting: 23.3  . Smokeless tobacco: Never Used  Substance and Sexual Activity  . Alcohol use: Yes    Alcohol/week: 0.0 standard drinks    Comment: occ  . Drug  use: No  . Sexual activity: Yes    Partners: Male  Lifestyle  . Physical activity    Days per week: Not on file    Minutes per session: Not on file  . Stress: Not on file  Relationships  . Social Musicianconnections    Talks on phone: Not on file    Gets together: Not on file    Attends religious service: Not on file    Active member of club or organization: Not on file    Attends meetings of clubs or organizations: Not on file    Relationship status: Not on file  . Intimate partner violence    Fear of current or ex partner: Not on file    Emotionally abused: Not on file    Physically abused: Not on file    Forced sexual activity: Not on file  Other Topics Concern  . Not on file  Social History Narrative  . Not on file     Constitutional: Denies fever, malaise, fatigue, headache or abrupt weight changes.    Cardiovascular: Denies chest pain, chest tightness, palpitations or swelling in the hands or feet.  Gastrointestinal: Denies abdominal pain, bloating, constipation, diarrhea or blood in the stool.  GU: Denies urgency, frequency, pain with urination, burning sensation, blood in urine, odor or discharge. Musculoskeletal: Pt reports muscle spasm in her back. Denies decrease in range of motion, difficulty with gait or joint pain and swelling.  Skin: Denies redness, rashes, lesions or ulcercations.    No other specific complaints in a complete review of systems (except as listed in HPI above).    Observations/Objective: LMP 03/11/2013   Wt Readings from Last 3 Encounters:  05/26/19 202 lb 6.4 oz (91.8 kg)  12/06/18 185 lb (83.9 kg)  06/22/18 195 lb (88.5 kg)    General: Appears her stated age, obese, in NAD. MSK: Laying in bed. Neuro: Alert and oriented   BMET    Component Value Date/Time   NA 139 05/21/2018 0907   K 4.3 05/21/2018 0907   CL 101 05/21/2018 0907   CO2 29 05/21/2018 0907   GLUCOSE 91 05/21/2018 0907   BUN 20 05/21/2018 0907   CREATININE 0.82  05/21/2018 0907   CALCIUM 10.1 05/21/2018 0907   GFRNONAA >60 05/23/2017 0222   GFRAA >60 05/23/2017 0222    Lipid Panel     Component Value Date/Time  CHOL 161 05/21/2018 0907   TRIG 44.0 05/21/2018 0907   HDL 90.50 05/21/2018 0907   CHOLHDL 2 05/21/2018 0907   VLDL 8.8 05/21/2018 0907   LDLCALC 61 05/21/2018 0907    CBC    Component Value Date/Time   WBC 5.9 05/21/2018 0907   RBC 4.72 05/21/2018 0907   HGB 14.4 05/21/2018 0907   HCT 41.9 05/21/2018 0907   PLT 303.0 05/21/2018 0907   MCV 88.6 05/21/2018 0907   MCH 29.7 05/22/2017 2125   MCHC 34.3 05/21/2018 0907   RDW 12.5 05/21/2018 0907   LYMPHSABS 3.2 05/03/2013 1127   MONOABS 0.7 05/03/2013 1127   EOSABS 0.2 05/03/2013 1127   BASOSABS 0.0 05/03/2013 1127    Hgb A1C No results found for: HGBA1C      Assessment and Plan: Acute Bilateral Low Back Pain, Muscle Spasms:  Flexeril refilled today Encouraged back exercises Encouraged NSAIDs and heat  Return precautions discussed  Follow Up Instructions:    I discussed the assessment and treatment plan with the patient. The patient was provided an opportunity to ask questions and all were answered. The patient agreed with the plan and demonstrated an understanding of the instructions.   The patient was advised to call back or seek an in-person evaluation if the symptoms worsen or if the condition fails to improve as anticipated.    Nicki Reaper, NP

## 2019-06-07 LAB — SARS CORONAVIRUS 2 (TAT 6-24 HRS): SARS Coronavirus 2: NEGATIVE

## 2019-06-09 ENCOUNTER — Encounter: Payer: Self-pay | Admitting: Gastroenterology

## 2019-06-09 ENCOUNTER — Ambulatory Visit (AMBULATORY_SURGERY_CENTER): Payer: 59 | Admitting: Gastroenterology

## 2019-06-09 ENCOUNTER — Other Ambulatory Visit: Payer: Self-pay

## 2019-06-09 VITALS — BP 106/69 | HR 72 | Temp 97.8°F | Resp 24 | Ht 70.0 in | Wt 202.0 lb

## 2019-06-09 DIAGNOSIS — Z1211 Encounter for screening for malignant neoplasm of colon: Secondary | ICD-10-CM

## 2019-06-09 MED ORDER — SODIUM CHLORIDE 0.9 % IV SOLN: 500.0000 mL | Freq: Once | INTRAVENOUS | Status: DC

## 2019-06-09 NOTE — Progress Notes (Signed)
A and O x3. Report to RN. Tolerated MAC anesthesia well.

## 2019-06-09 NOTE — Op Note (Signed)
[facility] Patient Name: Kelly Tran Procedure Date: 06/09/2019 8:55 AM MRN: [id number] Endoscopist: Sherilyn Cooter L. Myrtie Neither , MD Age: 53 Referring MD:  Date of Birth: 27-Jan-1966 Gender: Female Account #: [id number] Procedure:                Colonoscopy Indications:              Screening for colorectal malignant neoplasm (no                            polyps 08/2008) Medicines:                Monitored Anesthesia Care Procedure:                Pre-Anesthesia Assessment:                           - Prior to the procedure, a History and Physical                            was performed, and patient medications and                            allergies were reviewed. The patient's tolerance of                            previous anesthesia was also reviewed. The risks                            and benefits of the procedure and the sedation                            options and risks were discussed with the patient.                            All questions were answered, and informed consent                            was obtained. Prior Anticoagulants: The patient has                            taken no previous anticoagulant or antiplatelet                            agents except for aspirin. ASA Grade Assessment: II                            - A patient with mild systemic disease. After                            reviewing the risks and benefits, the patient was                            deemed in satisfactory condition to undergo the  procedure.                           After obtaining informed consent, the colonoscope                            was passed under direct vision. Throughout the                            procedure, the patient's blood pressure, pulse, and                            oxygen saturations were monitored continuously. The                            Colonoscope was introduced through the anus and   advanced to the the cecum, identified by                            appendiceal orifice and ileocecal valve. The                            colonoscopy was performed without difficulty. The                            patient tolerated the procedure well. The quality                            of the bowel preparation was excellent. The                            ileocecal valve, appendiceal orifice, and rectum                            were photographed. The bowel preparation used was                            SUPREP. Scope In: 9:01:34 AM Scope Out: 9:15:04 AM Scope Withdrawal Time: 0 hours 9 minutes 0 seconds  Total Procedure Duration: 0 hours 13 minutes 30 seconds  Findings:                 The perianal and digital rectal examinations were                            normal.                           The entire examined colon appeared normal on direct                            and retroflexion views. (somewhat redundant colon) Complications:            No immediate complications. Estimated Blood Loss:     Estimated blood loss: none. Impression:               - The entire examined colon is  normal on direct and                            retroflexion views.                           - No specimens collected. Recommendation:           - Patient has a contact number available for                            emergencies. The signs and symptoms of potential                            delayed complications were discussed with the                            patient. Return to normal activities tomorrow.                            Written discharge instructions were provided to the                            patient.                           - Resume previous diet.                           - Continue present medications.                           - Repeat colonoscopy in 10 years for screening                            purposes. Henry L. Myrtie Neitheranis, MD 06/09/2019 9:18:25 AM This report has  been signed electronically.

## 2019-06-09 NOTE — Patient Instructions (Signed)
Resume previous diet,  Continue present medications.  repeat colonoscopy in 10 years    YOU HAD AN ENDOSCOPIC PROCEDURE TODAY AT [facility]:   Refer to the procedure report that was given to you for any specific questions about what was found during the examination.  If the procedure report does not answer your questions, please call your gastroenterologist to clarify.  If you requested that your care partner not be given the details of your procedure findings, then the procedure report has been included in a sealed envelope for you to review at your convenience later.  YOU SHOULD EXPECT: Some feelings of bloating in the abdomen. Passage of more gas than usual.  Walking can help get rid of the air that was put into your GI tract during the procedure and reduce the bloating. If you had a lower endoscopy (such as a colonoscopy or flexible sigmoidoscopy) you may notice spotting of blood in your stool or on the toilet paper. If you underwent a bowel prep for your procedure, you may not have a normal bowel movement for a few days.  Please Note:  You might notice some irritation and congestion in your nose or some drainage.  This is from the oxygen used during your procedure.  There is no need for concern and it should clear up in a day or so.  SYMPTOMS TO REPORT IMMEDIATELY:   Following lower endoscopy (colonoscopy or flexible sigmoidoscopy):  Excessive amounts of blood in the stool  Significant tenderness or worsening of abdominal pains  Swelling of the abdomen that is new, acute  Fever of 100F or higher   For urgent or emergent issues, a gastroenterologist can be reached at any hour by calling [phone].   DIET:  We do recommend a small meal at first, but then you may proceed to your regular diet.  Drink plenty of fluids but you should avoid alcoholic beverages for 24 hours.  ACTIVITY:  You should plan to take it easy for the rest of today and you should NOT DRIVE or  use heavy machinery until tomorrow (because of the sedation medicines used during the test).    FOLLOW UP: Our staff will call the number listed on your records 48-72 hours following your procedure to check on you and address any questions or concerns that you may have regarding the information given to you following your procedure. If we do not reach you, we will leave a message.  We will attempt to reach you two times.  During this call, we will ask if you have developed any symptoms of COVID 19. If you develop any symptoms (ie: fever, flu-like symptoms, shortness of breath, cough etc.) before then, please call [phone].  If you test positive for Covid 19 in the 2 weeks post procedure, please call and report this information to [place].    If any biopsies were taken you will be contacted by phone or by letter within the next 1-3 weeks.  Please call us at [phone] if you have not heard about the biopsies in 3 weeks.    SIGNATURES/CONFIDENTIALITY: You and/or your care partner have signed paperwork which will be entered into your electronic medical record.  These signatures attest to the fact that that the information above on your After Visit Summary has been reviewed and is understood.  Full responsibility of the confidentiality of this discharge information lies with you and/or your care-partner.

## 2019-06-13 ENCOUNTER — Telehealth: Payer: Self-pay

## 2019-06-13 ENCOUNTER — Telehealth: Payer: Self-pay | Admitting: Gastroenterology

## 2019-06-13 NOTE — Telephone Encounter (Signed)
Spoke to the patient who is unsure if her new left midline pain is related to her recent colonoscopy. She reports left sided pain which started Sunday afternoon. She noticed this pain while sitting at her dining room table on the computer. She was able to sleep with the pain but states it is more noticeable this morning. The pain is consistently present but much sharper when she takes a deep breath and upon palpation. Please advise.

## 2019-06-13 NOTE — Telephone Encounter (Signed)
1st follow up call made.  NALM 

## 2019-06-13 NOTE — Telephone Encounter (Signed)
Called patient for post-procedure f/u call. Patient states she had some questions and spoke with another nurse this am. I see a message is already routed to Dr. Loletha Carrow and his nurse is currently handling this issue. Dr. Corena Pilgrim nurse will call patient back once she gets a reply from Dr. Loletha Carrow. Patient verbalizes understanding.

## 2019-06-13 NOTE — Telephone Encounter (Signed)
With how she has described it, I think the pain is unrelated to the 12/3 colonoscopy.  Recommend a call to primary care.

## 2019-06-13 NOTE — Telephone Encounter (Signed)
Notified patient via detailed VM.

## 2019-06-14 ENCOUNTER — Telehealth: Payer: Self-pay

## 2019-06-14 NOTE — Telephone Encounter (Signed)
Pt has lt midline pain that radiates to front of abd; if takes deep breath has sharp pain. Palpitation causing side to hurt worse.Pt is consistent and started 06/12/19. Pain level now is 3 and if takes deep breath pain level 5-6. Pt has occasional prod cough with clear phlegm. Pt has constipation; colonoscopy on 06/09/19 and results were OK. Last normal BM was 06/12/19. Pt  took miralax 06/13/19 and 06/14/19. Pt has hx of kidney stones. Pt has covid symptoms of abd pain and cough, no travel and no known exposure to + covid. Pt will go to [facility] in [place] this morning. ED precautions given and pt voiced understanding. FYI to Avie Echevaria NP.

## 2019-06-14 NOTE — Telephone Encounter (Signed)
will await UC notes

## 2019-06-15 ENCOUNTER — Other Ambulatory Visit: Payer: Self-pay

## 2019-06-15 ENCOUNTER — Encounter (HOSPITAL_COMMUNITY): Payer: Self-pay | Admitting: Emergency Medicine

## 2019-06-15 ENCOUNTER — Ambulatory Visit (HOSPITAL_COMMUNITY)
Admission: EM | Admit: 2019-06-15 | Discharge: 2019-06-15 | Disposition: A | Discharge status: Home / Self Care | Payer: 59 | Attending: Family Medicine | Admitting: Family Medicine

## 2019-06-15 DIAGNOSIS — M546 Pain in thoracic spine: Secondary | ICD-10-CM | POA: Diagnosis not present

## 2019-06-15 NOTE — ED Triage Notes (Signed)
Here for left flank pain onset 4 days... pain increases w/breathing, cough, sneezing  Denies urinary sx  A&O x4... no acute distress.

## 2019-06-16 NOTE — ED Provider Notes (Signed)
[facility]   [id number] 06/15/19 Arrival Time: 1842  ASSESSMENT & PLAN:  1. Acute left-sided thoracic back pain   L side pain.   Unclear etiology. No suspicion for cardiac etiology or pneumothorax. Reassured. Pain very well-localized over L side at inferior ribs. No injury. Benign abdominal exam. No indications for urgent imaging at this time. No s/s of infectious etiology. No SOB or CP. Discussed. She is comfortable with observation and taking 600-800mg  ibuprofen with food for the next few days.   Follow-up Information    [facility].   Specialty: Urgent Care Why: If worsening or failing to improve as anticipated. Contact information: [address] [place] [address] [phone]          Reviewed expectations re: course of current medical issues. Questions answered. Outlined signs and symptoms indicating need for more acute intervention. Patient verbalized understanding. After Visit Summary given.   SUBJECTIVE: History from: patient. Kelly Tran is a 53 y.o. female who presents with complaint of fairly persistent left side pain around inferior ribs. First noticed about 4 days ago. No trauma/injury. Describes pain as dull with occasional sharp exacerbation. Aggravating factors: deep breaths, coughing or sneezing. Pain tolerable at rest. No specific abdominal pain reported. Ambulatory without difficulty. Pain does not wake her at night. No associated CP or SOB. No recent illnesses. Fever: absent. No LE edema or calf swelling/pain. She denies arthralgias, diarrhea, myalgias and sweats. No urinary symptoms. Appetite: normal. PO intake: normal. Bowel movements: questions mild constipation. History of similar: no. OTC treatment: no OTC analgesics taken.  Patient's last menstrual period was 03/11/2013.   Past Surgical History:  Procedure Laterality Date  . APPENDECTOMY    . DILATION AND CURETTAGE OF UTERUS     . miscarriage     x3--required d and c    ROS: As per HPI. All other systems negative.  OBJECTIVE:  Vitals:   06/15/19 1943  BP: (!) 144/94  Pulse: 99  Resp: 18  Temp: 98.4 F (36.9 C)  TempSrc: Oral  SpO2: 100%    General appearance: alert, oriented, no acute distress HEENT: [place]; AT; oropharynx moist Lungs: clear to auscultation bilaterally; unlabored respirations Heart: regular rate and rhythm Chest Wall: well-localized tenderness to palpation of L inferior ribs at side; no overlying skin changes or signs of trauma Abdomen: soft; without distention; no specific tenderness to palpation; normal bowel sounds; without masses or organomegaly; without guarding or rebound tenderness Back: without CVA tenderness; FROM at waist Extremities: without LE edema; symmetrical; without gross deformities Skin: warm and dry Neurologic: normal gait Psychological: alert and cooperative; normal mood and affect   No Known Allergies                                             Past Medical History:  Diagnosis Date  . Allergy   . G E R D 02/22/2007  . Genital warts   . Hemorrhoids, internal 01/31/2011  . Hypothyroidism   . Migraine    Social History   Socioeconomic History  . Marital status: Married    Spouse name: Not on file  . Number of children: Not on file  . Years of education: Not on file  . Highest education level: Not on file  Occupational History  . Not on file  Tobacco Use  . Smoking  status: Former Smoker    Quit date: 01/31/1996    Years since quitting: 23.3  . Smokeless tobacco: Never Used  Substance and Sexual Activity  . Alcohol use: Yes    Alcohol/week: 0.0 standard drinks    Comment: occ  . Drug use: No  . Sexual activity: Yes    Partners: Male  Other Topics Concern  . Not on file  Social History Narrative  . Not on file   Social Determinants of Health   Financial Resource Strain:   . Difficulty of Paying Living Expenses: Not on file  Food Insecurity:    . Worried About Charity fundraiser in the Last Year: Not on file  . Ran Out of Food in the Last Year: Not on file  Transportation Needs:   . Lack of Transportation (Medical): Not on file  . Lack of Transportation (Non-Medical): Not on file  Physical Activity:   . Days of Exercise per Week: Not on file  . Minutes of Exercise per Session: Not on file  Stress:   . Feeling of Stress : Not on file  Social Connections:   . Frequency of Communication with Friends and Family: Not on file  . Frequency of Social Gatherings with Friends and Family: Not on file  . Attends Religious Services: Not on file  . Active Member of Clubs or Organizations: Not on file  . Attends Archivist Meetings: Not on file  . Marital Status: Not on file  Intimate Partner Violence:   . Fear of Current or Ex-Partner: Not on file  . Emotionally Abused: Not on file  . Physically Abused: Not on file  . Sexually Abused: Not on file   Family History  Problem Relation Age of Onset  . Stroke Mother   . Arthritis Mother   . Hypertension Father   . Arthritis Father   . Hyperlipidemia Father   . Cancer Maternal Aunt        ovarian  . Arthritis Maternal Grandmother   . Arthritis Maternal Grandfather   . Arthritis Paternal Grandmother   . Arthritis Paternal Grandfather   . Cancer Maternal Aunt        Breast  . Cancer Maternal Aunt        Lung  . Diabetes Neg Hx        family  . Stomach cancer Neg Hx   . Colon cancer Neg Hx   . Pancreatic cancer Neg Hx   . Esophageal cancer Neg Hx   . Rectal cancer Neg Hx      Vanessa Kick, MD 06/16/19 [phone]

## 2019-06-20 ENCOUNTER — Other Ambulatory Visit (INDEPENDENT_AMBULATORY_CARE_PROVIDER_SITE_OTHER): Payer: 59

## 2019-06-20 ENCOUNTER — Other Ambulatory Visit: Payer: Self-pay

## 2019-06-20 DIAGNOSIS — E063 Autoimmune thyroiditis: Secondary | ICD-10-CM

## 2019-06-20 DIAGNOSIS — E038 Other specified hypothyroidism: Secondary | ICD-10-CM | POA: Diagnosis not present

## 2019-06-20 LAB — T3, FREE: T3, Free: 4.7 pg/mL — ABNORMAL HIGH (ref 2.3–4.2)

## 2019-06-20 LAB — T4, FREE: Free T4: 0.8 ng/dL (ref 0.60–1.60)

## 2019-06-20 LAB — TSH: TSH: 0.27 u[IU]/mL — ABNORMAL LOW (ref 0.35–4.50)

## 2019-06-21 LAB — THYROID PEROXIDASE ANTIBODY: Thyroperoxidase Ab SerPl-aCnc: 456 IU/mL — ABNORMAL HIGH (ref <9)

## 2019-06-23 ENCOUNTER — Other Ambulatory Visit: Payer: Self-pay | Admitting: Podiatry

## 2019-06-24 ENCOUNTER — Ambulatory Visit: Payer: 59 | Attending: Internal Medicine

## 2019-06-24 DIAGNOSIS — Z20822 Contact with and (suspected) exposure to covid-19: Secondary | ICD-10-CM

## 2019-06-25 LAB — NOVEL CORONAVIRUS, NAA: SARS-CoV-2, NAA: NOT DETECTED

## 2019-09-10 ENCOUNTER — Other Ambulatory Visit: Payer: Self-pay | Admitting: Internal Medicine

## 2019-10-06 ENCOUNTER — Other Ambulatory Visit: Payer: Self-pay

## 2019-10-06 ENCOUNTER — Ambulatory Visit (INDEPENDENT_AMBULATORY_CARE_PROVIDER_SITE_OTHER): Payer: 59 | Admitting: Family Medicine

## 2019-10-06 ENCOUNTER — Encounter: Payer: Self-pay | Admitting: Family Medicine

## 2019-10-06 VITALS — BP 130/84 | HR 72 | Temp 98.2°F | Ht 68.0 in | Wt 205.5 lb

## 2019-10-06 DIAGNOSIS — B029 Zoster without complications: Secondary | ICD-10-CM

## 2019-10-06 MED ORDER — VALACYCLOVIR HCL 1 G PO TABS: 1000.0000 mg | ORAL_TABLET | Freq: Three times a day (TID) | ORAL | 0 refills | Status: AC

## 2019-10-06 MED ORDER — GABAPENTIN 300 MG PO CAPS: 300.0000 mg | ORAL_CAPSULE | Freq: Every day | ORAL | 0 refills | Status: DC | PRN

## 2019-10-06 NOTE — Progress Notes (Signed)
   Subjective:     Kelly Tran is a 54 y.o. female presenting for mouth issue (blisters in her mouth- top roof of her mouth. had shingles in the past that started like this. noticed the break out on 10/04/19)     HPI   #Blisters on mouth - started 2 days ago - painful - getting worse - right side of the inside of her mouth - shingles in the past on the left side of the roof of her mouth   Review of Systems   Social History   Tobacco Use  Smoking Status Former Smoker  . Quit date: 01/31/1996  . Years since quitting: 23.6  Smokeless Tobacco Never Used        Objective:    BP Readings from Last 3 Encounters:  10/06/19 130/84  06/15/19 (!) 144/94  06/09/19 106/69   Wt Readings from Last 3 Encounters:  10/06/19 205 lb 8 oz (93.2 kg)  06/09/19 202 lb (91.6 kg)  05/26/19 202 lb 6.4 oz (91.8 kg)    BP 130/84   Pulse 72   Temp 98.2 F (36.8 C)   Ht 5\' 8"  (1.727 m)   Wt 205 lb 8 oz (93.2 kg)   LMP 03/11/2013   BMI 31.25 kg/m    Physical Exam Constitutional:      General: She is not in acute distress.    Appearance: She is well-developed. She is not diaphoretic.  HENT:     Right Ear: External ear normal.     Left Ear: External ear normal.     Mouth/Throat:     Comments: Ulcer patch of lesions on the right upper pallet Eyes:     Conjunctiva/sclera: Conjunctivae normal.  Cardiovascular:     Rate and Rhythm: Normal rate.  Pulmonary:     Effort: Pulmonary effort is normal.  Musculoskeletal:     Cervical back: Neck supple.  Skin:    General: Skin is warm and dry.     Capillary Refill: Capillary refill takes less than 2 seconds.  Neurological:     Mental Status: She is alert. Mental status is at baseline.  Psychiatric:        Mood and Affect: Mood normal.        Behavior: Behavior normal.           Assessment & Plan:   Problem List Items Addressed This Visit      Other   Herpes zoster without complication - Primary    Reviewed upper  pallet dermatome is the same and this is her 2nd recurrence. Gabapentin for severe pain. Valacyclovir as <72 hours. Pt will highly consider the Shingles shot given second episode      Relevant Medications   valACYclovir (VALTREX) 1000 MG tablet   gabapentin (NEURONTIN) 300 MG capsule       Return if symptoms worsen or fail to improve.  05/11/2013, MD

## 2019-10-06 NOTE — Assessment & Plan Note (Signed)
Reviewed upper pallet dermatome is the same and this is her 2nd recurrence. Gabapentin for severe pain. Valacyclovir as <72 hours. Pt will highly consider the Shingles shot given second episode

## 2019-10-06 NOTE — Patient Instructions (Signed)
Zoster - consider getting the vaccine - Pain - start with tylenol or ibuprofen - if no improvement - can try gabapentin

## 2019-12-15 ENCOUNTER — Other Ambulatory Visit: Payer: Self-pay

## 2019-12-15 ENCOUNTER — Ambulatory Visit (INDEPENDENT_AMBULATORY_CARE_PROVIDER_SITE_OTHER): Payer: 59 | Admitting: Internal Medicine

## 2019-12-15 ENCOUNTER — Encounter: Payer: Self-pay | Admitting: Internal Medicine

## 2019-12-15 VITALS — BP 120/70 | HR 84 | Ht 68.5 in | Wt 199.0 lb

## 2019-12-15 DIAGNOSIS — L659 Nonscarring hair loss, unspecified: Secondary | ICD-10-CM | POA: Diagnosis not present

## 2019-12-15 DIAGNOSIS — E063 Autoimmune thyroiditis: Secondary | ICD-10-CM | POA: Diagnosis not present

## 2019-12-15 DIAGNOSIS — E038 Other specified hypothyroidism: Secondary | ICD-10-CM

## 2019-12-15 DIAGNOSIS — E538 Deficiency of other specified B group vitamins: Secondary | ICD-10-CM | POA: Diagnosis not present

## 2019-12-15 DIAGNOSIS — E559 Vitamin D deficiency, unspecified: Secondary | ICD-10-CM

## 2019-12-15 LAB — VITAMIN B12: Vitamin B-12: >1526 pg/mL — ABNORMAL HIGH (ref 211–911)

## 2019-12-15 LAB — T3, FREE: T3, Free: 4.4 pg/mL — ABNORMAL HIGH (ref 2.3–4.2)

## 2019-12-15 LAB — T4, FREE: Free T4: 0.69 ng/dL (ref 0.60–1.60)

## 2019-12-15 LAB — TSH: TSH: 0.59 u[IU]/mL (ref 0.35–4.50)

## 2019-12-15 NOTE — Progress Notes (Signed)
Patient ID: [name], female   DOB: 24-Aug-1965, 54 y.o.   MRN: [id number]   This visit occurred during the SARS-CoV-2 public health emergency.  Safety protocols were in place, including screening questions prior to the visit, additional usage of staff PPE, and extensive cleaning of exam room while observing appropriate contact time as indicated for disinfecting solutions.   HPI  Kelly Tran is a 54 y.o.-year-old female, initially referred by her PCP, Kelly Fenton, NP, returning for follow-up for Hashimoto's hypothyroidism.  He previously saw Dr. Dwyane Tran, last visit 01/2018.  I first saw the patient in 07/2018.  Last visit with me 7 months ago (virtual).  She recently had 2 episodes of dizziness and nausea, which she attributes to low blood pressure, possibly, now resolved.  She is not on blood pressure medicines.  Reviewed and addended history: Pt. has been dx with hypothyroidism in 11/2011.  She was initially started on levothyroxine, but she did not tolerate this well.  She then was changed to Armour in 07/2012 and 45 mg daily, then increase to 60 mg daily in 06/2015.  However, she developed palpitations (stopped after stopping amitriptyline).  Her medication was changed to levothyroxine 50 mcg + liothyronine 10 mg daily in 02/2017.  This was increased to 8 tablets of levothyroxine and 14 tablets of liothyronine a week in 05/2018.  At that time, TSH was 3.77.  However, she was complaining of fatigue, cold intolerance, hair loss, and constipation and we switched to Armour thyroid.  Her symptoms improved on this, with the exception of hair loss.  In 06/2019, we checked her TFTs and a TSH was slightly suppressed.  I sent her a message asking about whether she took biotin or not before the tests and the need to possibly alternating 90 with 60 mg of Armour every other day, but she did not answer to the message and she continues on the 90 mg daily.  She continues on Armour 90 mg daily: - in  am - fasting - at least 30 min from b'fast - no iron, PPIs - + calcium, multivitamins - at night - not on Biotin  Reviewed her TFTs: Lab Results  Component Value Date   TSH 0.27 (L) 06/20/2019   TSH 1.26 11/10/2018   TSH 6.91 (H) 09/21/2018   TSH 7.30 (H) 08/06/2018   TSH 0.84 06/22/2018   TSH 3.77 05/21/2018   TSH 2.41 02/02/2018   TSH 2.08 01/12/2018   TSH 2.02 06/25/2017   TSH 1.080 05/23/2017   FREET4 0.80 06/20/2019   FREET4 0.56 (L) 11/10/2018   FREET4 0.57 (L) 09/21/2018   FREET4 0.58 (L) 08/06/2018   FREET4 0.77 06/22/2018   FREET4 0.69 05/21/2018   FREET4 0.79 01/12/2018   FREET4 0.74 06/25/2017   FREET4 0.71 03/25/2017   FREET4 0.58 (L) 02/05/2017   T3FREE 4.7 (H) 06/20/2019   T3FREE 2.8 11/10/2018   T3FREE 3.8 09/21/2018   T3FREE 3.3 08/06/2018   T3FREE 3.7 06/22/2018   T3FREE 3.3 01/12/2018   T3FREE 4.1 06/25/2017   T3FREE 3.0 03/25/2017   T3FREE 2.3 02/05/2017   T3FREE 2.6 07/09/2016   Her TPO antibodies are elevated: Component     Latest Ref Rng & Units 06/22/2018 11/10/2018  Thyroglobulin Ab     < or = 1 IU/mL <1   Thyroperoxidase Ab SerPl-aCnc     <9 IU/mL 822 (H) 770 (H)   We started selenium 200 mcg daily in 07/2018.  She also started to improve her  diet to reduce gluten, nuts, dairy.  Pt denies: - feeling nodules in neck - hoarseness - dysphagia - choking - SOB with lying down  She has no FH of thyroid disorders.No FH of thyroid cancer. No h/o radiation tx to head or neck.  No herbal supplements. No recent steroids use.   Pt. also has a history of GERD, multiple pregnancy losses, low vitamin B-12 (on liquid b12  - ? Dose) and D (on vitamin D 7500 units daily), and also low testosterone.  Lab Results  Component Value Date   VITAMINB12 357 05/21/2018   VITAMINB12 220 02/02/2018   VITAMINB12 392 07/15/2016   VITAMINB12 452 06/28/2015   VITAMINB12 295 08/08/2008   Lab Results  Component Value Date   VD25OH 32.55 05/21/2018    VD25OH 22.45 (L) 02/02/2018   VD25OH 25.22 (L) 07/15/2016   VD25OH 23.18 (L) 11/20/2015   VD25OH 38.23 09/26/2015   VD25OH 22.84 (L) 06/28/2015   ROS: Constitutional: no weight gain/no weight loss, + fatigue, no subjective hyperthermia, no subjective hypothermia Eyes: no blurry vision, no xerophthalmia ENT: no sore throat, + see HPI Cardiovascular: no CP/no SOB/no palpitations/no leg swelling Respiratory: no cough/no SOB/no wheezing Gastrointestinal: no N/no V/no D/no C/no acid reflux Musculoskeletal: no muscle aches/no joint aches Skin: no rashes, + hair loss Neurological: no tremors/no numbness/no tingling/+ dizziness  I reviewed pt's medications, allergies, PMH, social hx, family hx, and changes were documented in the history of present illness. Otherwise, unchanged from my initial visit note.  Past Medical History:  Diagnosis Date  . Allergy   . G E R D 02/22/2007  . Genital warts   . Hemorrhoids, internal 01/31/2011  . Hypothyroidism   . Migraine    Past Surgical History:  Procedure Laterality Date  . APPENDECTOMY    . DILATION AND CURETTAGE OF UTERUS    . miscarriage     x3--required d and c   Social History   Socioeconomic History  . Marital status: Married    Spouse name: Not on file  . Number of children: Not on file  . Years of education: Not on file  . Highest education level: Not on file  Occupational History  . Not on file  Tobacco Use  . Smoking status: Former Smoker    Quit date: 01/31/1996    Years since quitting: 23.8  . Smokeless tobacco: Never Used  Vaping Use  . Vaping Use: Never used  Substance and Sexual Activity  . Alcohol use: Yes    Alcohol/week: 0.0 standard drinks    Comment: occ  . Drug use: No  . Sexual activity: Yes    Partners: Male  Other Topics Concern  . Not on file  Social History Narrative  . Not on file   Social Determinants of Health   Financial Resource Strain:   . Difficulty of Paying Living Expenses:   Food  Insecurity:   . Worried About Programme researcher, broadcasting/film/video in the Last Year:   . Barista in the Last Year:   Transportation Needs:   . Freight forwarder (Medical):   Marland Kitchen Lack of Transportation (Non-Medical):   Physical Activity:   . Days of Exercise per Week:   . Minutes of Exercise per Session:   Stress:   . Feeling of Stress :   Social Connections:   . Frequency of Communication with Friends and Family:   . Frequency of Social Gatherings with Friends and Family:   . Attends Religious Services:   .  Active Member of Clubs or Organizations:   . Attends Banker Meetings:   Marland Kitchen Marital Status:   Intimate Partner Violence:   . Fear of Current or Ex-Partner:   . Emotionally Abused:   Marland Kitchen Physically Abused:   . Sexually Abused:    Current Outpatient Medications on File Prior to Visit  Medication Sig Dispense Refill  . ARMOUR THYROID 90 MG tablet TAKE 1 TABLET BY MOUTH EVERY DAY 90 tablet 1  . aspirin EC 81 MG tablet Take 81 mg daily by mouth.    Marland Kitchen CALCIUM PO Take 1 tablet daily with lunch by mouth.    . celecoxib (CELEBREX) 200 MG capsule TAKE 1 CAPSULE BY MOUTH EVERY DAY 30 capsule 1  . Cholecalciferol (VITAMIN D PO) Take 500 Units daily by mouth.    . Cyanocobalamin (VITAMIN B-12 PO) Take 1 tablet daily with lunch by mouth.    . cyclobenzaprine (FLEXERIL) 10 MG tablet Take 1 tablet (10 mg total) by mouth 3 (three) times daily as needed for muscle spasms. 30 tablet 0  . Fexofenadine HCl (ALLEGRA PO) Take 1 tablet daily as needed by mouth (seasonal allergies).    . fluticasone (FLONASE) 50 MCG/ACT nasal spray Place 2 sprays into both nostrils daily. 16 g 0  . gabapentin (NEURONTIN) 300 MG capsule Take 1 capsule (300 mg total) by mouth daily as needed. 30 capsule 0  . loratadine (CLARITIN) 10 MG tablet Take 10 mg by mouth daily.    . Multiple Vitamin (MULTIVITAMIN WITH MINERALS) TABS tablet Take 1 tablet daily with lunch by mouth.    . Pyridoxine HCl (VITAMIN B-6 PO) Take 1  tablet daily with lunch by mouth.     No current facility-administered medications on file prior to visit.   No Known Allergies Family History  Problem Relation Age of Onset  . Stroke Mother   . Arthritis Mother   . Hypertension Father   . Arthritis Father   . Hyperlipidemia Father   . Cancer Maternal Aunt        ovarian  . Arthritis Maternal Grandmother   . Arthritis Maternal Grandfather   . Arthritis Paternal Grandmother   . Arthritis Paternal Grandfather   . Cancer Maternal Aunt        Breast  . Cancer Maternal Aunt        Lung  . Diabetes Neg Hx        family  . Stomach cancer Neg Hx   . Colon cancer Neg Hx   . Pancreatic cancer Neg Hx   . Esophageal cancer Neg Hx   . Rectal cancer Neg Hx     PE: BP 120/70   Pulse 84   Ht 5' 8.5" (1.74 m) Comment: measured today without shoes  Wt 199 lb (90.3 kg)   LMP 03/11/2013   SpO2 97%   BMI 29.82 kg/m  Wt Readings from Last 3 Encounters:  12/15/19 199 lb (90.3 kg)  10/06/19 205 lb 8 oz (93.2 kg)  06/09/19 202 lb (91.6 kg)   Constitutional: overweight, in NAD Eyes: PERRLA, EOMI, no exophthalmos ENT: moist mucous membranes, no thyromegaly, no cervical lymphadenopathy Cardiovascular: RRR, No MRG Respiratory: CTA B Gastrointestinal: abdomen soft, NT, ND, BS+ Musculoskeletal: no deformities, strength intact in all 4 Skin: moist, warm, no rashes Neurological: no tremor with outstretched hands, DTR normal in all 4  ASSESSMENT: 1. Hypothyroidism -Due to Hashimoto's thyroiditis  2.  Hair loss  3.  Vitamin D insufficiency  4.  History  of low B12 vitamin  PLAN:  1. Patient with longstanding Hashimoto's hypothyroidism, previously on levothyroxine and liothyronine, currently on Armour.  Before switching to Armour, she had several complaints possibly related to hypothyroidism: Fatigue, constipation, cold intolerance.  We initially started 60 mg of Armour daily, however, we had to increase the dose to 90 mg daily  09/2018. -She continues on selenium 200 mcg daily.  We rechecked her TPO antibodies and they were still elevated but improving. - latest thyroid labs reviewed with pt >> slightly low: Lab Results  Component Value Date   TSH 0.27 (L) 06/20/2019  -At that time, I sent her a message through MyChart about possibly alternating 90 with 60 mg of Armour every other day.  However, she did not answer the message and continues on Armour 90 daily - pt feels good on this dose.  She did have 2 episodes of dizziness with mild nausea and we discussed that these are more likely caused by dehydration, but we definitely need to check her thyroid tests to make sure they are normal. - we discussed about taking the thyroid hormone every day, with water, >30 minutes before breakfast, separated by >4 hours from acid reflux medications, calcium, iron, multivitamins. Pt. is taking it correctly. - will check thyroid tests today: TSH, free T3 and fT4 - If labs are abnormal, she will need to return for repeat TFTs in 1.5 months -Otherwise, I will see her back in a year  2.  Hair loss -At last visit we discussed about starting a hair, skin, and nails vitamin we discussed about stopping this before checking thyroid labs due to possible biotin interference with the assay -She did start on the Harris, skin, and nails vitamins and she felt a slight improvement in the hair loss, but not significantly.  She is now off biotin for lab work.  3.  Vitamin D insufficiency -She takes 7500 units vitamin D daily  -Latest level was normal in 05/2018 -She would want me to recheck her level today  4.  History of low B12 vitamin -She takes liquid vitamin B12 but unclear what dose.  She will look at home and we know. -Discussed that B12 deficiency can also cause some dizziness -We will recheck level today   Component     Latest Ref Rng & Units 12/15/2019  TSH     0.35 - 4.50 uIU/mL 0.59  T4,Free(Direct)     0.60 - 1.60 ng/dL 4.85   Triiodothyronine,Free,Serum     2.3 - 4.2 pg/mL 4.4 (H)  Vitamin B12     211 - 911 pg/mL >1526 (H)  Vitamin D, 25-Hydroxy     30.0 - 100.0 ng/mL 60.9   Vitamin B12 is very high.  We will need to decrease the dose.  I wonder if this could be related to her dizziness episode. The rest of the labs are normal -we can continue the current dose of Armour.  Carlus Pavlov, MD PhD [facility] Endocrinology

## 2019-12-15 NOTE — Patient Instructions (Addendum)
Please continue Armour 90 mg daily.  Take the thyroid hormone every day, with water, at least 30 minutes before breakfast, separated by at least 4 hours from: - acid reflux medications - calcium - iron - multivitamins  Please stop at the lab.  Please come for another appointment in 1 year

## 2019-12-16 ENCOUNTER — Encounter: Payer: Self-pay | Admitting: Internal Medicine

## 2019-12-16 LAB — VITAMIN D 25 HYDROXY (VIT D DEFICIENCY, FRACTURES): Vit D, 25-Hydroxy: 60.9 ng/mL (ref 30.0–100.0)

## 2020-03-07 ENCOUNTER — Other Ambulatory Visit: Payer: Self-pay | Admitting: Internal Medicine

## 2020-05-10 ENCOUNTER — Ambulatory Visit: Payer: 59 | Admitting: Internal Medicine

## 2020-05-29 ENCOUNTER — Encounter: Payer: Self-pay | Admitting: Podiatry

## 2020-05-29 ENCOUNTER — Ambulatory Visit (INDEPENDENT_AMBULATORY_CARE_PROVIDER_SITE_OTHER): Payer: 59 | Admitting: Podiatry

## 2020-05-29 ENCOUNTER — Other Ambulatory Visit: Payer: Self-pay

## 2020-05-29 DIAGNOSIS — M722 Plantar fascial fibromatosis: Secondary | ICD-10-CM

## 2020-05-29 DIAGNOSIS — S93691A Other sprain of right foot, initial encounter: Secondary | ICD-10-CM | POA: Diagnosis not present

## 2020-05-29 MED ORDER — METHYLPREDNISOLONE 4 MG PO TBPK
ORAL_TABLET | ORAL | 0 refills | Status: DC
Start: 2020-05-29 — End: 2020-06-05

## 2020-05-29 MED ORDER — DICLOFENAC SODIUM 75 MG PO TBEC
75.0000 mg | DELAYED_RELEASE_TABLET | Freq: Two times a day (BID) | ORAL | 1 refills | Status: DC
Start: 2020-05-29 — End: 2020-10-25

## 2020-05-29 NOTE — Progress Notes (Signed)
She presents today after having not seen her for a year states that my wrist my heels really have not gotten any better I continue to try the orthotics on a regular basis mornings are absolutely horrible I can barely walk.  Is starting to affect my ability to perform her daily activities.  Objective: Vital signs are stable alert oriented x3 I have reviewed her past medical history medications allergies surgeries and social history.  She has severe pain on palpation medial calcaneal tubercles bilateral is starting to develop some pain along the posterior tibial tendon bilaterally.  Right foot seems to be worse than that of the left.  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  Plan: Discussed etiology pathology conservative versus surgical therapies.  At this point failure to respond to any other conservative therapies I feel that an MRI is necessary.  We will request MRI of the right foot evaluating posterior tibial tendon and evaluating the chronic proximal plantar fasciitis for possible tear or chronic fasciitis.  This is for surgical planning.  I also wrote a prescription for methylprednisolone and diclofenac 75 mg 1 p.o. twice daily follow-up with her in 3 weeks after the MRIs done.

## 2020-06-05 ENCOUNTER — Other Ambulatory Visit: Payer: Self-pay

## 2020-06-05 ENCOUNTER — Ambulatory Visit
Admission: RE | Admit: 2020-06-05 | Discharge: 2020-06-05 | Disposition: A | Discharge status: Home / Self Care | Payer: 59 | Source: Ambulatory Visit | Attending: Internal Medicine | Admitting: Internal Medicine

## 2020-06-05 ENCOUNTER — Ambulatory Visit (INDEPENDENT_AMBULATORY_CARE_PROVIDER_SITE_OTHER): Payer: 59 | Admitting: Internal Medicine

## 2020-06-05 ENCOUNTER — Encounter: Payer: Self-pay | Admitting: Internal Medicine

## 2020-06-05 ENCOUNTER — Ambulatory Visit (INDEPENDENT_AMBULATORY_CARE_PROVIDER_SITE_OTHER)
Admission: RE | Admit: 2020-06-05 | Discharge: 2020-06-05 | Disposition: A | Discharge status: Home / Self Care | Payer: 59 | Source: Ambulatory Visit | Attending: Internal Medicine | Admitting: Internal Medicine

## 2020-06-05 VITALS — BP 126/84 | HR 64 | Temp 96.4°F | Wt 202.0 lb

## 2020-06-05 DIAGNOSIS — M25531 Pain in right wrist: Secondary | ICD-10-CM

## 2020-06-05 DIAGNOSIS — M25532 Pain in left wrist: Secondary | ICD-10-CM

## 2020-06-05 DIAGNOSIS — R202 Paresthesia of skin: Secondary | ICD-10-CM | POA: Diagnosis not present

## 2020-06-05 DIAGNOSIS — R2231 Localized swelling, mass and lump, right upper limb: Secondary | ICD-10-CM

## 2020-06-05 IMAGING — DX DG WRIST COMPLETE 3+V*R*
4 series · 4 of 4 positions shown · non-contrast
Comparison: None.

CLINICAL DATA: Pain

EXAM:
RIGHT WRIST - COMPLETE 3+ VIEW

[wrist ap]
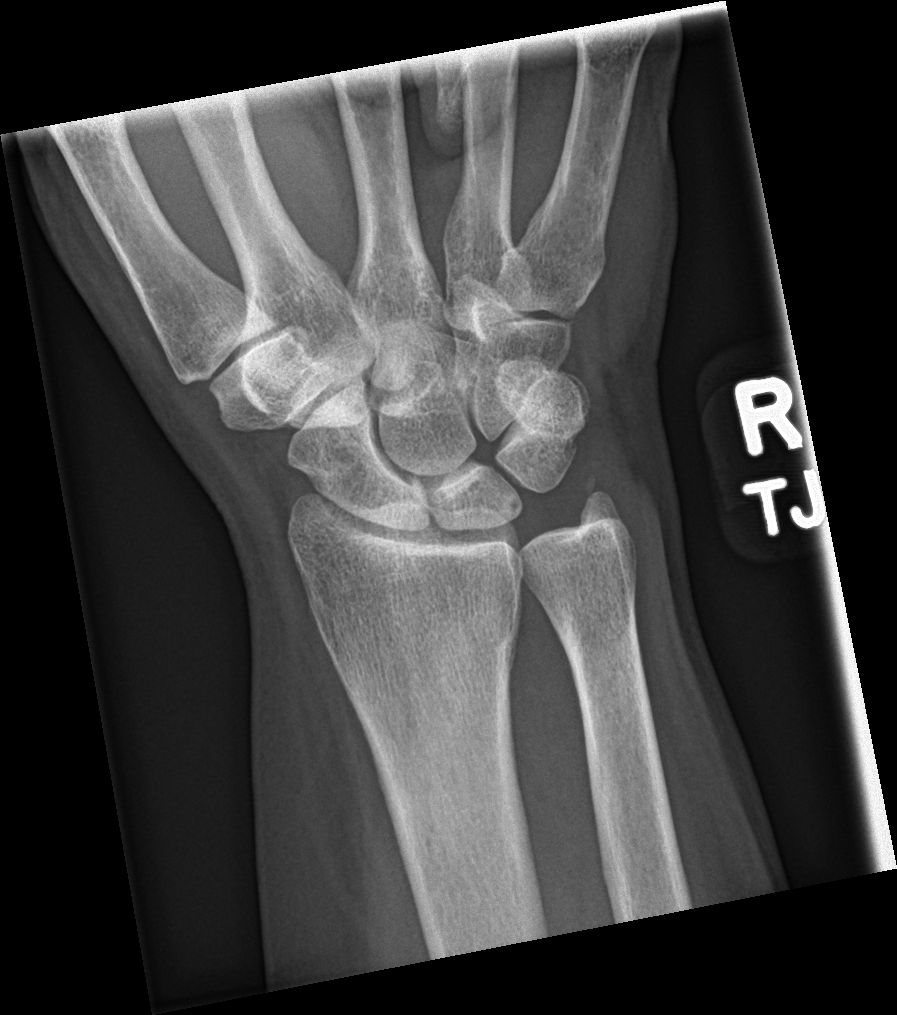

[wrist obl]
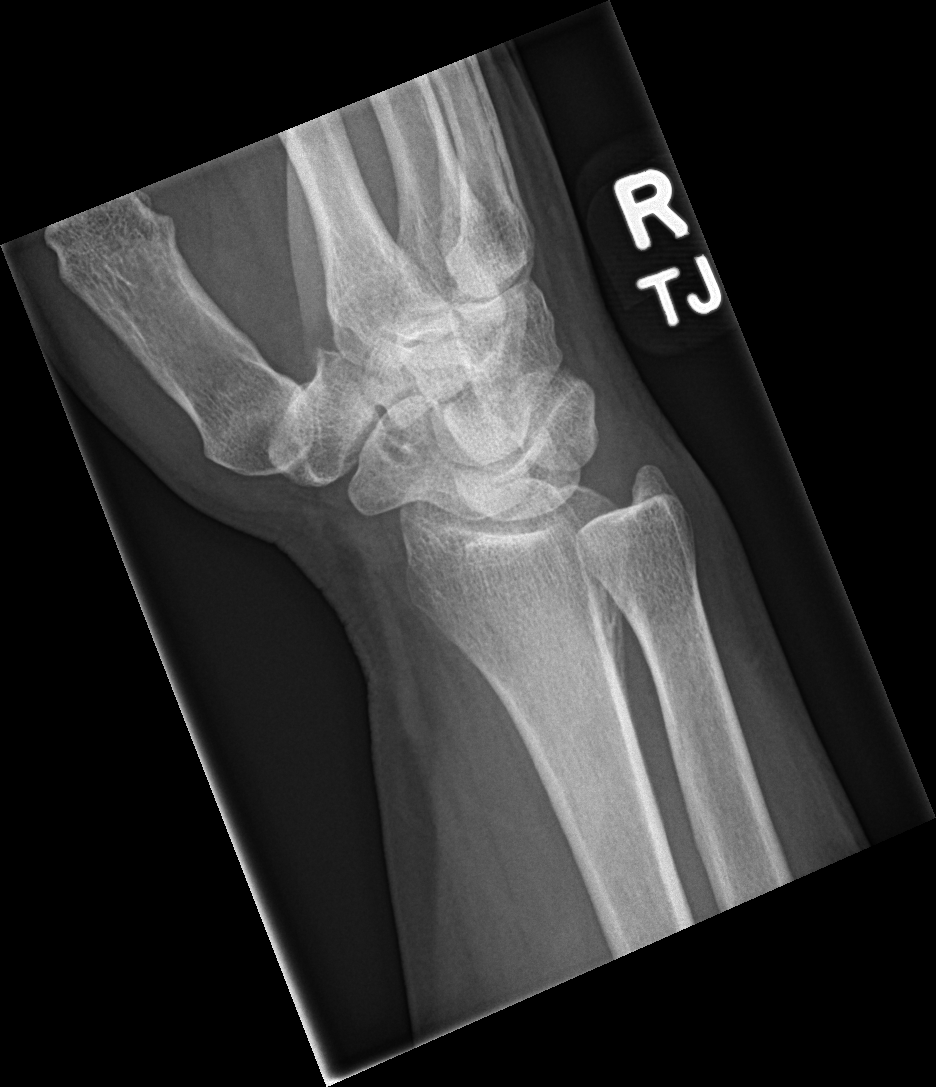

[wrist lat]
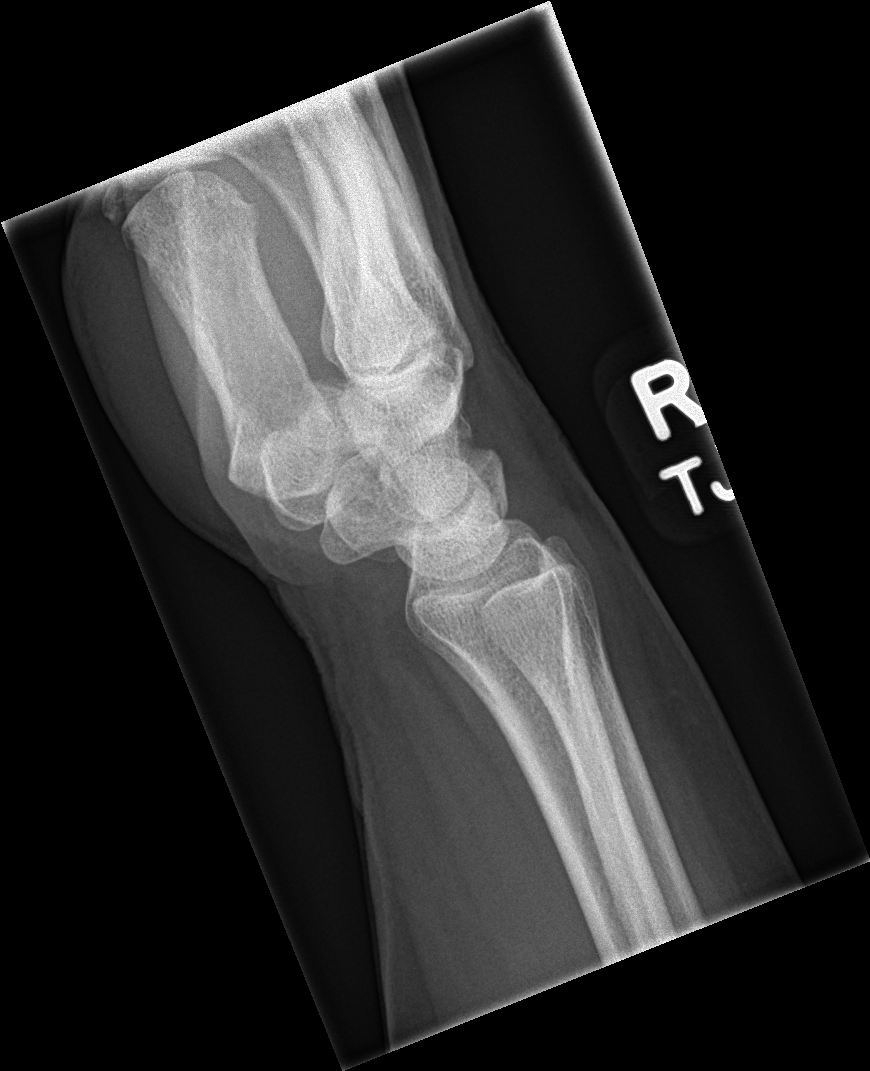

[wrist pa]
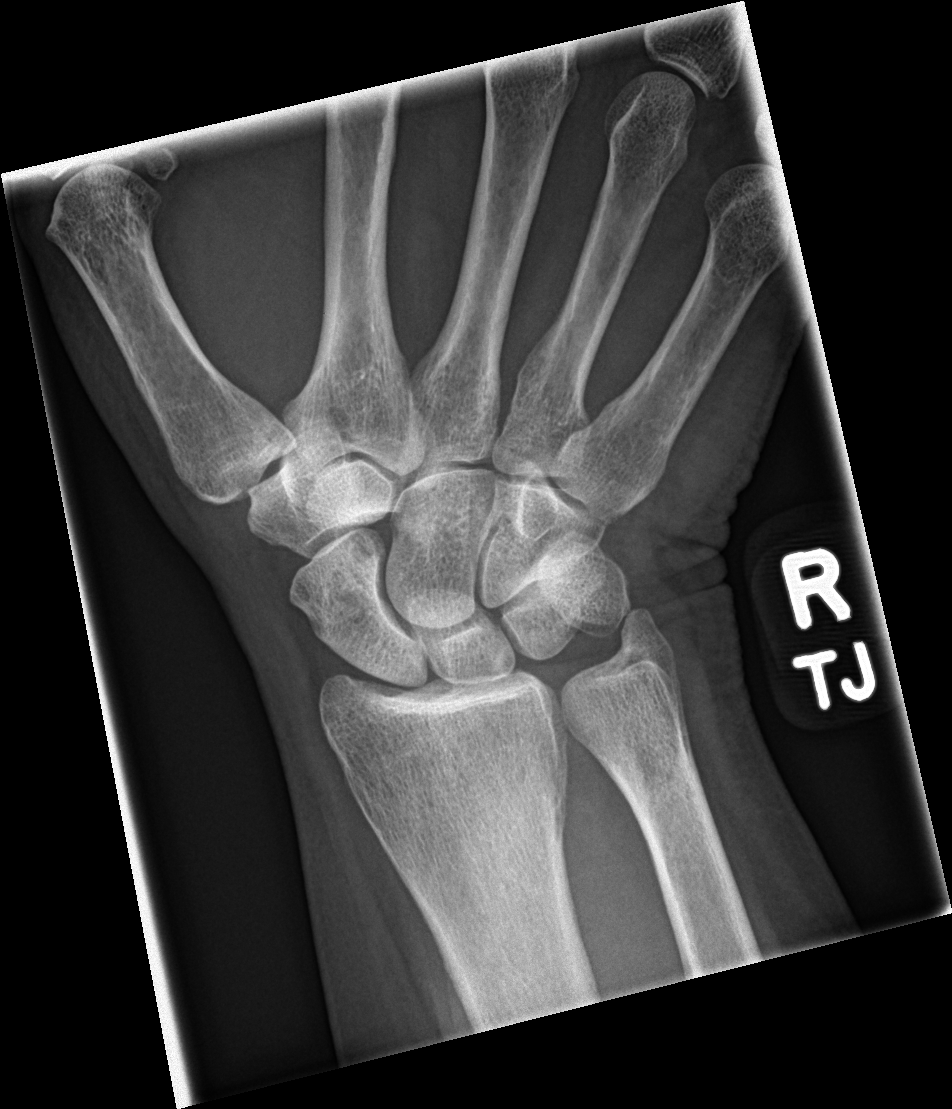

[4 of 4 positions shown; findings below may reference images not displayed]

FINDINGS: Frontal, oblique, lateral, and ulnar deviation scaphoid images were
obtained. No fracture or dislocation. Joint spaces appear normal. No
erosive change.
IMPRESSION: No fracture or dislocation.  No evident arthropathy.

## 2020-06-05 IMAGING — DX DG WRIST COMPLETE 3+V*L*
4 series · 4 of 4 positions shown · non-contrast
Comparison: None.

CLINICAL DATA: Left wrist pain for 6 months.  No known injury.

EXAM:
LEFT WRIST - COMPLETE 3+ VIEW

[wrist ap]
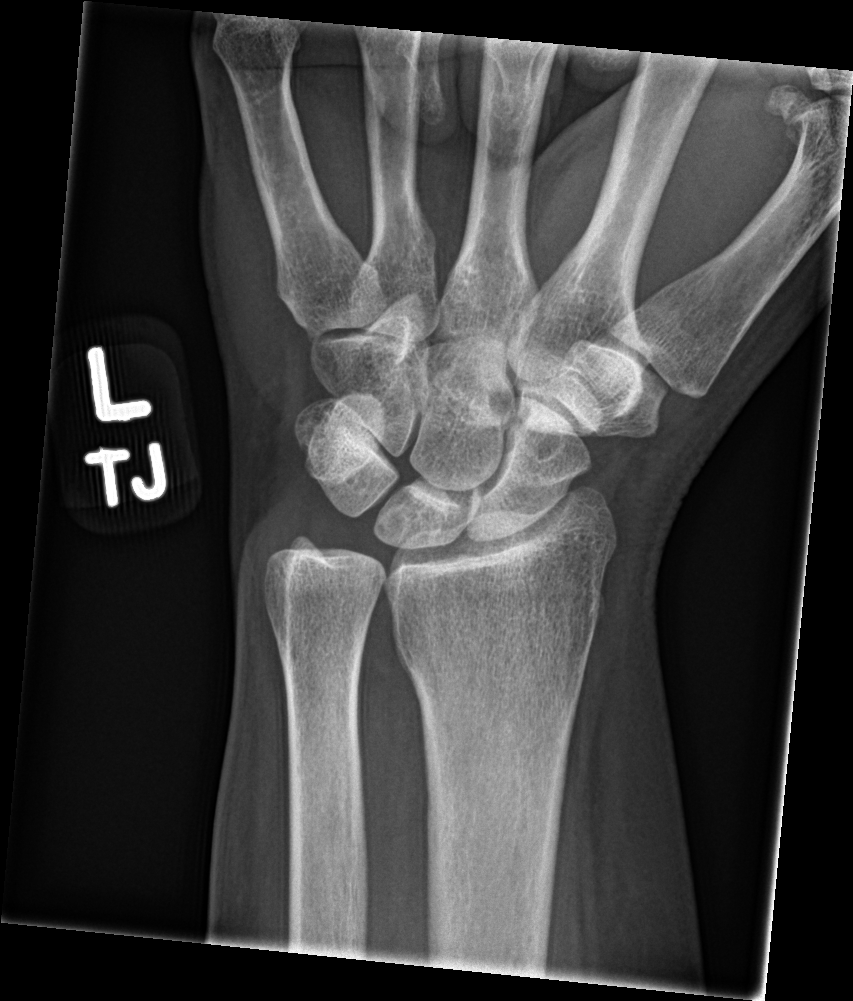

[wrist obl]
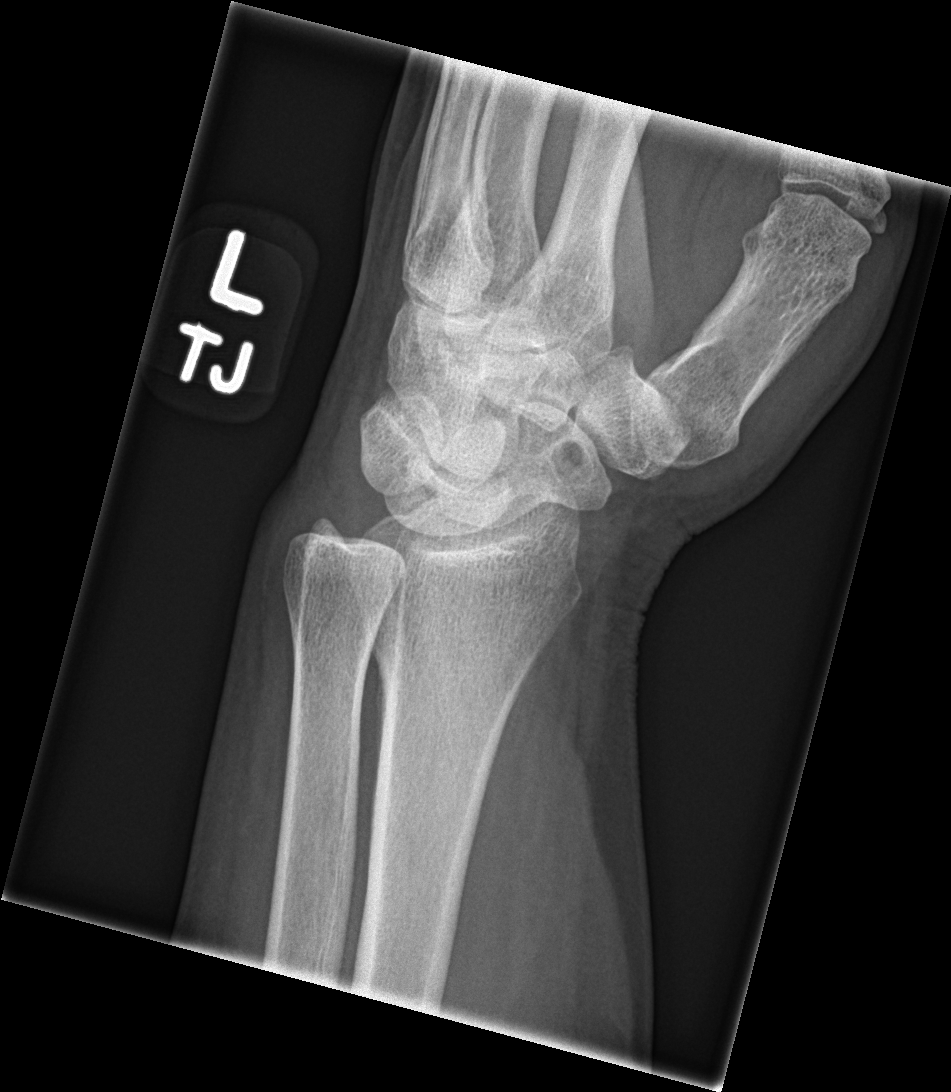

[wrist lat]
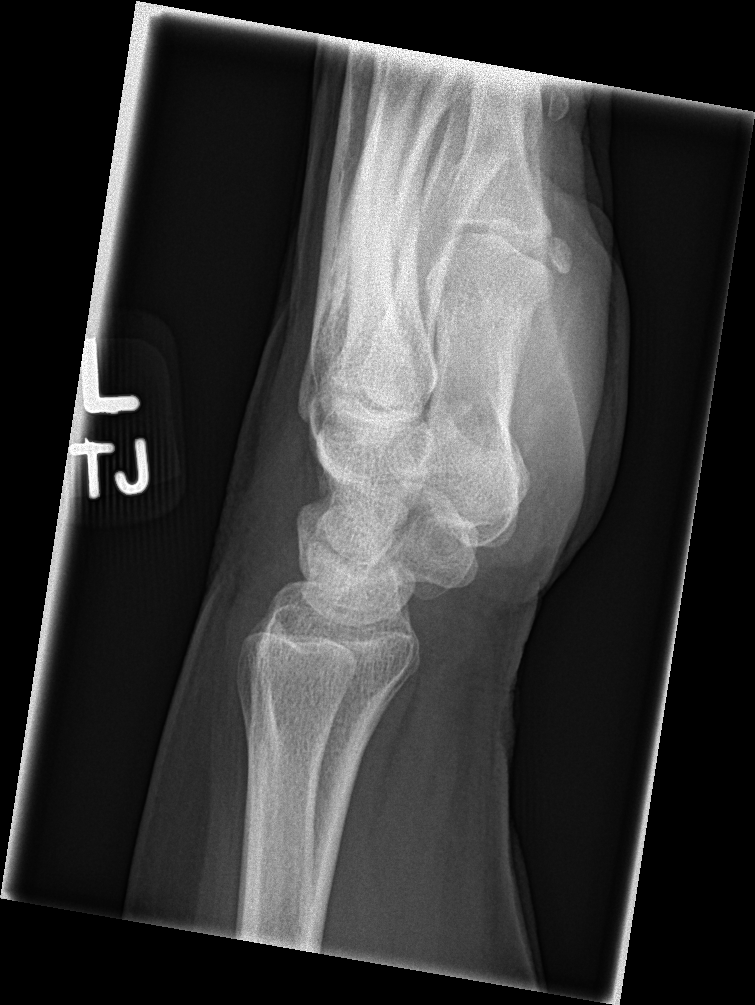

[wrist pa]
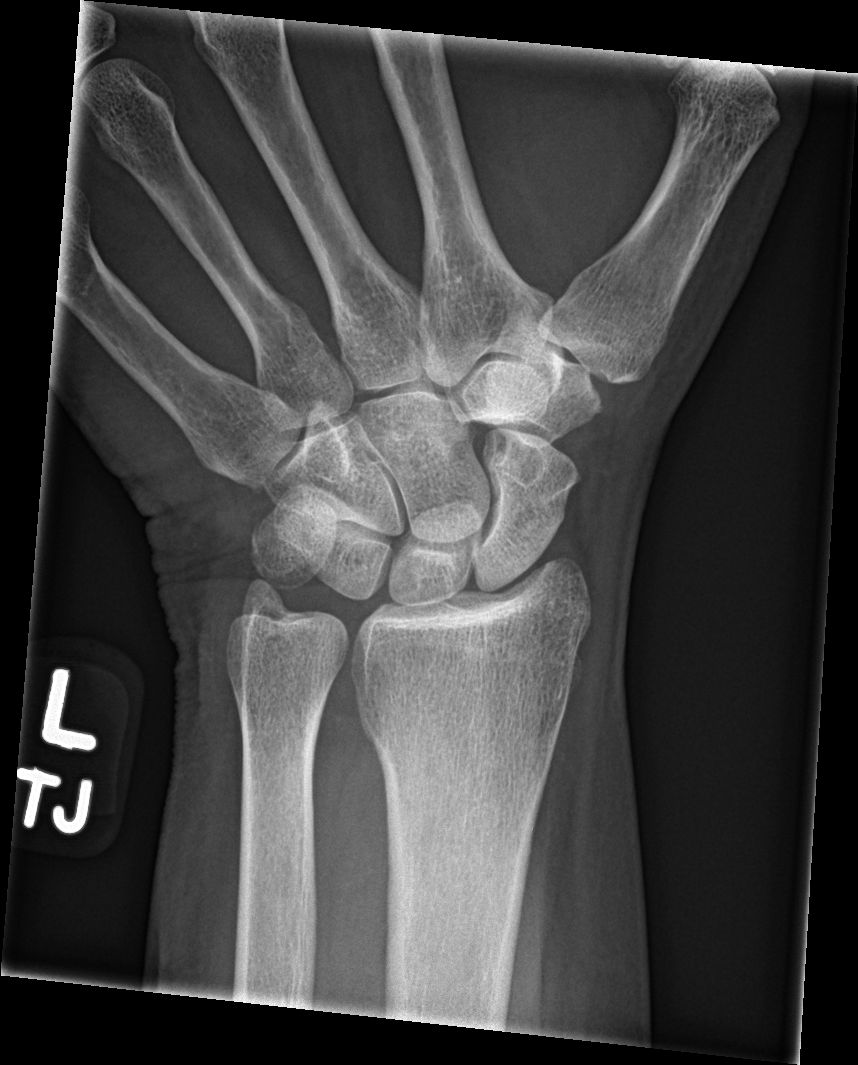

[4 of 4 positions shown; findings below may reference images not displayed]

FINDINGS: There is no evidence of fracture or dislocation. There is no
evidence of arthropathy or other focal bone abnormality. Small cyst
or enchondroma in the distal pole the scaphoid incidentally noted.
Soft tissues are unremarkable.
IMPRESSION: Negative exam.

## 2020-06-05 NOTE — Patient Instructions (Signed)
Wrist Pain, Adult There are many things that can cause wrist pain. Some common causes include:  An injury to the wrist area.  Overuse of the joint.  A condition that causes too much pressure to be put on a nerve in the wrist (carpal tunnel syndrome).  Wear and tear of the joints that happens as a person gets older (osteoarthritis).  Other types of arthritis. Sometimes, the cause of wrist pain is not known. Often, the pain goes away when you follow your doctor's instructions for helping pain at home, such as resting or icing your wrist. If your wrist pain does not go away, it is important to tell your doctor. Follow these instructions at home:  Rest the wrist area for 48 hours or more, or as long as told by your doctor.  If a splint or elastic bandage has been put on your wrist, use it as told by your doctor. ? Take off the splint or bandage only as told by your doctor. ? Loosen the splint or bandage if your fingers tingle, lose feeling (get numb), or turn cold or blue.  If directed, apply ice to the injured area: ? If you have a removable splint or elastic bandage, remove it as told by your doctor. ? Put ice in a plastic bag. ? Place a towel between your skin and the bag or between your splint or bandage and the bag. ? Leave the ice on for 20 minutes, 2-3 times a day.   Keep your arm raised (elevated) above the level of your heart while you are sitting or lying down.  Take over-the-counter and prescription medicines only as told by your doctor.  Keep all follow-up visits as told by your doctor. This is important. Contact a doctor if:  You have a sudden sharp pain in the wrist, hand, or arm that is different or new.  The swelling or bruising on your wrist or hand gets worse.  Your skin becomes red, gets a rash, or has open sores.  Your pain does not get better or it gets worse. Get help right away if:  You lose feeling in your fingers or hand.  Your fingers turn white,  very red, or cold and blue.  You cannot move your fingers.  You have a fever or chills. This information is not intended to replace advice given to you by your health care provider. Make sure you discuss any questions you have with your health care provider. Document Revised: 06/05/2017 Document Reviewed: 01/10/2016 Elsevier Patient Education  2020 [employer]    

## 2020-06-05 NOTE — Progress Notes (Signed)
Subjective:    Patient ID: Kelly Tran, female    DOB: [birth year], 54 y.o.   MRN: [id number]  HPI  Patient presents the clinic today with complaint of bilateral wrist pain.  This started about 6 months ago.  She describes the pain as burning and achy. The pain radiates up to her elbows. She reports associated numbness and tingling. The pain is worse at the end of the day but does not wake her up night. She denies weakness. She has noticed a lump to the right wrist that changes in size from time to time. She has not tried anything OTC but is currently on Prednisone which seems to be helping.  She denies any injury to the area. She denies any neck pain or joint swelling.  Review of Systems  Past Medical History:  Diagnosis Date  . Allergy   . G E R D 02/22/2007  . Genital warts   . Hemorrhoids, internal 01/31/2011  . Hypothyroidism   . Migraine     Current Outpatient Medications  Medication Sig Dispense Refill  . ARMOUR THYROID 90 MG tablet TAKE 1 TABLET BY MOUTH EVERY DAY 30 tablet 5  . aspirin EC 81 MG tablet Take 81 mg daily by mouth.    Marland Kitchen CALCIUM PO Take 1 tablet daily with lunch by mouth.    . Cholecalciferol (VITAMIN D PO) Take 500 Units daily by mouth.    . Cyanocobalamin (VITAMIN B-12 PO) Take 1 tablet daily with lunch by mouth.    . cyclobenzaprine (FLEXERIL) 10 MG tablet Take 1 tablet (10 mg total) by mouth 3 (three) times daily as needed for muscle spasms. 30 tablet 0  . diclofenac (VOLTAREN) 75 MG EC tablet Take 1 tablet (75 mg total) by mouth 2 (two) times daily. 60 tablet 1  . Fexofenadine HCl (ALLEGRA PO) Take 1 tablet daily as needed by mouth (seasonal allergies).    . fluticasone (FLONASE) 50 MCG/ACT nasal spray Place 2 sprays into both nostrils daily. 16 g 0  . loratadine (CLARITIN) 10 MG tablet Take 10 mg by mouth daily.    . Multiple Vitamin (MULTIVITAMIN WITH MINERALS) TABS tablet Take 1 tablet daily with lunch by mouth.    . Pyridoxine HCl (VITAMIN B-6 PO)  Take 1 tablet daily with lunch by mouth.    . valACYclovir (VALTREX) 1000 MG tablet Take 1,000 mg by mouth 3 (three) times daily.     No current facility-administered medications for this visit.    No Known Allergies  Family History  Problem Relation Age of Onset  . Stroke Mother   . Arthritis Mother   . Hypertension Father   . Arthritis Father   . Hyperlipidemia Father   . Cancer Maternal Aunt        ovarian  . Arthritis Maternal Grandmother   . Arthritis Maternal Grandfather   . Arthritis Paternal Grandmother   . Arthritis Paternal Grandfather   . Cancer Maternal Aunt        Breast  . Cancer Maternal Aunt        Lung  . Diabetes Neg Hx        family  . Stomach cancer Neg Hx   . Colon cancer Neg Hx   . Pancreatic cancer Neg Hx   . Esophageal cancer Neg Hx   . Rectal cancer Neg Hx     Social History   Socioeconomic History  . Marital status: Married    Spouse name: Not on file  .  Number of children: Not on file  . Years of education: Not on file  . Highest education level: Not on file  Occupational History  . Not on file  Tobacco Use  . Smoking status: Former Smoker    Quit date: 01/31/1996    Years since quitting: 24.3  . Smokeless tobacco: Never Used  Vaping Use  . Vaping Use: Never used  Substance and Sexual Activity  . Alcohol use: Yes    Alcohol/week: 0.0 standard drinks    Comment: occ  . Drug use: No  . Sexual activity: Yes    Partners: Male  Other Topics Concern  . Not on file  Social History Narrative  . Not on file   Social Determinants of Health   Financial Resource Strain:   . Difficulty of Paying Living Expenses: Not on file  Food Insecurity:   . Worried About Programme researcher, broadcasting/film/video in the Last Year: Not on file  . Ran Out of Food in the Last Year: Not on file  Transportation Needs:   . Lack of Transportation (Medical): Not on file  . Lack of Transportation (Non-Medical): Not on file  Physical Activity:   . Days of Exercise per Week:  Not on file  . Minutes of Exercise per Session: Not on file  Stress:   . Feeling of Stress : Not on file  Social Connections:   . Frequency of Communication with Friends and Family: Not on file  . Frequency of Social Gatherings with Friends and Family: Not on file  . Attends Religious Services: Not on file  . Active Member of Clubs or Organizations: Not on file  . Attends Banker Meetings: Not on file  . Marital Status: Not on file  Intimate Partner Violence:   . Fear of Current or Ex-Partner: Not on file  . Emotionally Abused: Not on file  . Physically Abused: Not on file  . Sexually Abused: Not on file     Constitutional: Denies fever, malaise, fatigue, headache or abrupt weight changes.  Respiratory: Denies difficulty breathing, shortness of breath, cough or sputum production.   Cardiovascular: Denies chest pain, chest tightness, palpitations or swelling in the hands or feet.  Musculoskeletal: Pt reports bilateral wrist pain. Denies decrease in range of motion, difficulty with gait, muscle pain or joint swelling.  Skin: Pt reports lump of right wrist. Denies redness, rashes, lesions or ulcercations.  Neurological: Pt reports numbness and tingling of hands. Denies weakness or problems with coordination.    No other specific complaints in a complete review of systems (except as listed in HPI above).     Objective:   Physical Exam  BP 126/84   Pulse 64   Temp (!) 96.4 F (35.8 C) (Temporal)   Wt 202 lb (91.6 kg)   LMP 03/11/2013   SpO2 98%   BMI 30.27 kg/m   Wt Readings from Last 3 Encounters:  12/15/19 199 lb (90.3 kg)  10/06/19 205 lb 8 oz (93.2 kg)  06/09/19 202 lb (91.6 kg)    General: Appears her stated age, well developed, well nourished in NAD. Skin: 1 cm hard nodule noted of right medial carpals. Cardiovascular: Normal rate. Radial pulses 2+ bilaterally. Pulmonary/Chest: Normal effort. Musculoskeletal: Normal flexion, extension and rotation of  bilateral wrist. No joint swelling noted. No pain with palpation. Hand grips equal. Neurological: Alert and oriented. Positive Phalen's and Tinel's on the right.  Coordination normal.    BMET    Component Value Date/Time  NA 139 05/21/2018 0907   K 4.3 05/21/2018 0907   CL 101 05/21/2018 0907   CO2 29 05/21/2018 0907   GLUCOSE 91 05/21/2018 0907   BUN 20 05/21/2018 0907   CREATININE 0.82 05/21/2018 0907   CALCIUM 10.1 05/21/2018 0907   GFRNONAA >60 05/23/2017 0222   GFRAA >60 05/23/2017 0222    Lipid Panel     Component Value Date/Time   CHOL 161 05/21/2018 0907   TRIG 44.0 05/21/2018 0907   HDL 90.50 05/21/2018 0907   CHOLHDL 2 05/21/2018 0907   VLDL 8.8 05/21/2018 0907   LDLCALC 61 05/21/2018 0907    CBC    Component Value Date/Time   WBC 5.9 05/21/2018 0907   RBC 4.72 05/21/2018 0907   HGB 14.4 05/21/2018 0907   HCT 41.9 05/21/2018 0907   PLT 303.0 05/21/2018 0907   MCV 88.6 05/21/2018 0907   MCH 29.7 05/22/2017 2125   MCHC 34.3 05/21/2018 0907   RDW 12.5 05/21/2018 0907   LYMPHSABS 3.2 05/03/2013 1127   MONOABS 0.7 05/03/2013 1127   EOSABS 0.2 05/03/2013 1127   BASOSABS 0.0 05/03/2013 1127    Hgb A1C No results found for: HGBA1C         Assessment & Plan:   Bilateral Wrist Pain, Lump of Right Wrist, Paresthesia of BUE:  DDX include OA, calcium deposit, carpal tunnel Xray bilateral wrist today Consider right wrist cock up splint pending xray On Prednisone, continue until course completed  Will follow up after xrays, return precautions discussed Nicki Reaper, NP This visit occurred during the SARS-CoV-2 public health emergency.  Safety protocols were in place, including screening questions prior to the visit, additional usage of staff PPE, and extensive cleaning of exam room while observing appropriate contact time as indicated for disinfecting solutions.

## 2020-06-06 ENCOUNTER — Ambulatory Visit
Admission: RE | Admit: 2020-06-06 | Discharge: 2020-06-06 | Disposition: A | Discharge status: Home / Self Care | Payer: 59 | Source: Ambulatory Visit | Attending: Podiatry | Admitting: Podiatry

## 2020-06-06 ENCOUNTER — Encounter: Payer: Self-pay | Admitting: Internal Medicine

## 2020-06-06 DIAGNOSIS — S93691A Other sprain of right foot, initial encounter: Secondary | ICD-10-CM

## 2020-06-06 IMAGING — MR MR ANKLE*R* W/O CM
6 series · 40 of 40 positions shown · non-contrast
Comparison: Plain films right foot [DATE].

CLINICAL DATA: Chronic right heel pain.

EXAM:
MRI OF THE RIGHT ANKLE WITHOUT CONTRAST
TECHNIQUE: Multiplanar, multisequence MR imaging of the ankle was performed. No
intravenous contrast was administered.

[Series 3: T2 fat-sat · axial · 3.0mm · 0.50mm/px · z∈[-36,+85]mm · 8 of 32 slices shown (1 of 2)]
[im 1/32]
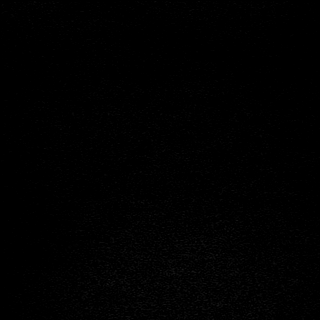
[im 5/32]
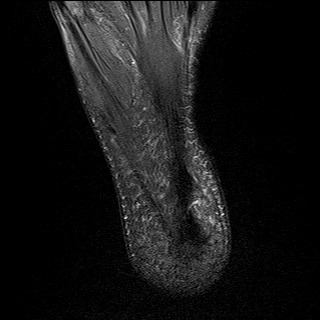
[im 9/32]
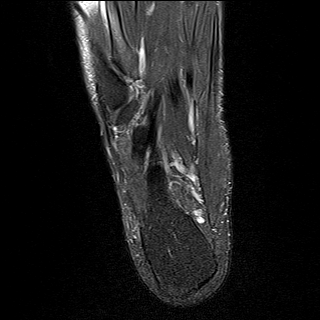
[im 14/32]
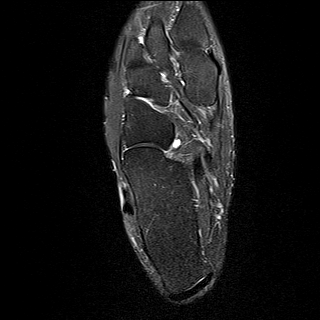
[im 18/32]
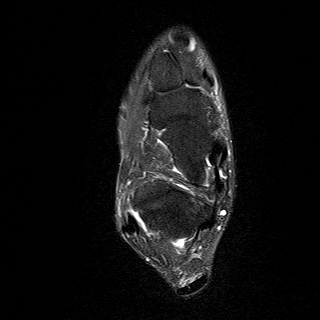
[im 23/32]
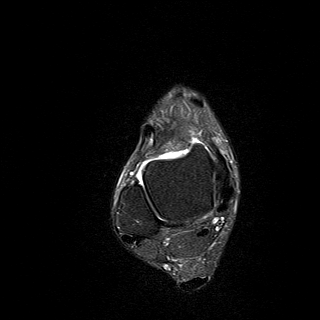
[im 27/32]
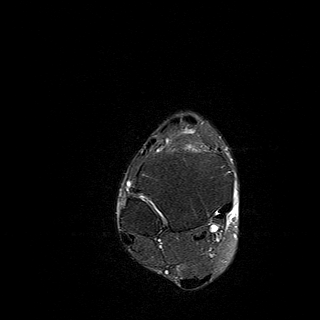
[im 32/32]
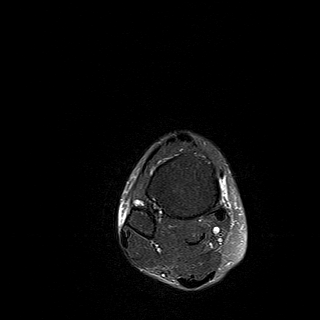

[Series 4: PD fat-sat · axial · 3.0mm · 0.42mm/px · z∈[-36,+85]mm · 7 of 32 slices shown]
[im 1/32]
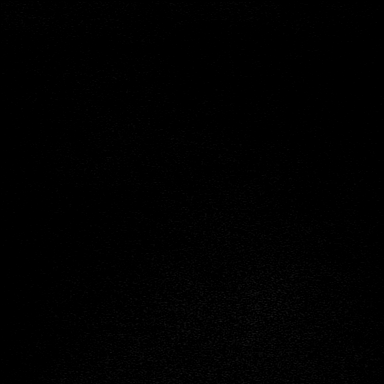
[im 6/32]
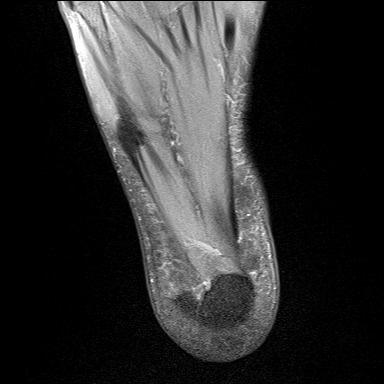
[im 11/32]
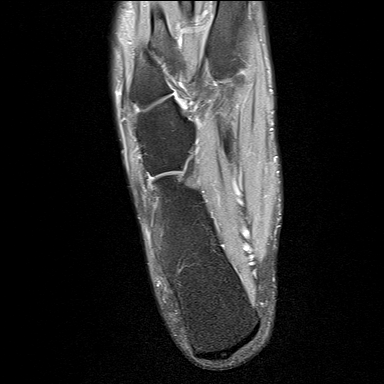
[im 16/32]
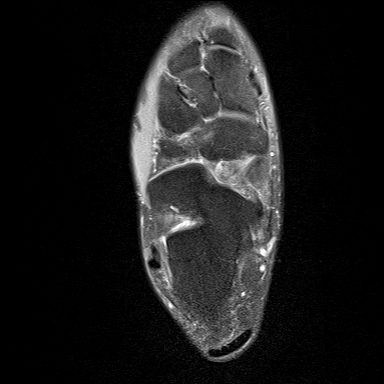
[im 21/32]
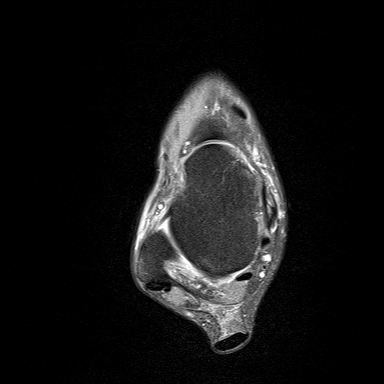
[im 26/32]
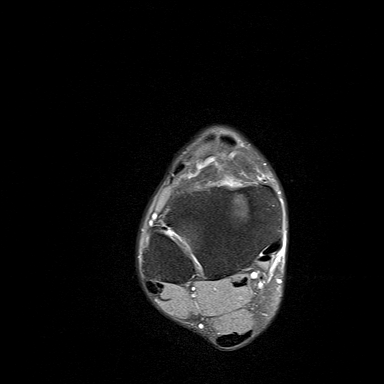
[im 32/32]
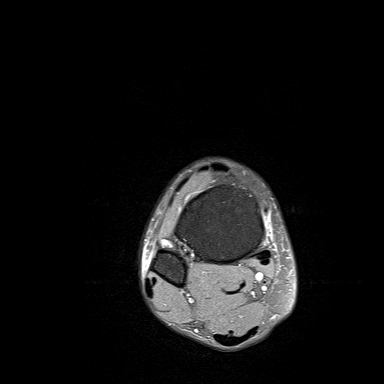

[Series 5: T1 · axial · 3.0mm · 0.50mm/px · z∈[-35,+86]mm · 7 of 32 slices shown (1 of 2)]
[im 1/32]
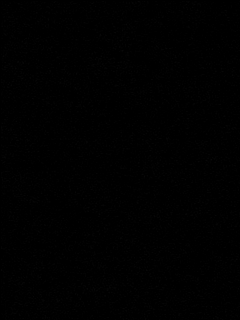
[im 6/32]
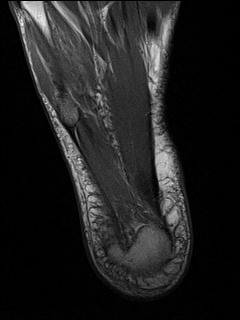
[im 11/32]
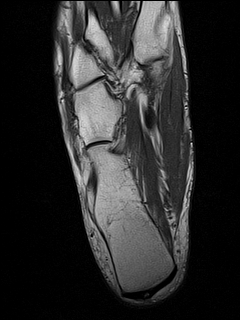
[im 16/32]
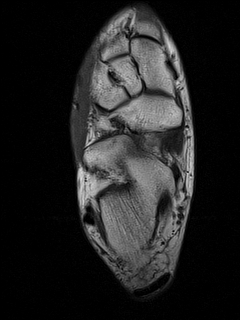
[im 21/32]
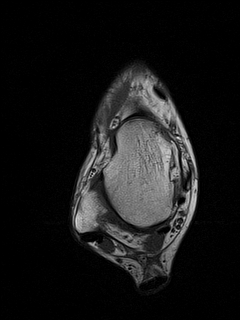
[im 26/32]
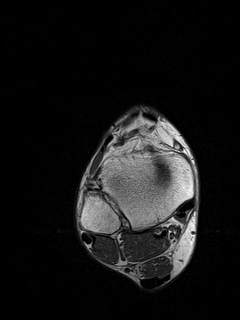
[im 32/32]
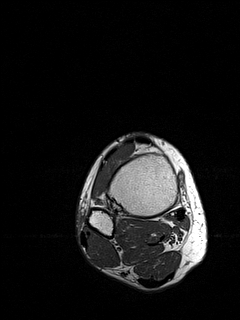

[Series 6: T1 · sagittal · 4.0mm · 0.56mm/px · 4 of 20 slices shown (2 of 2)]
[im 1/20]
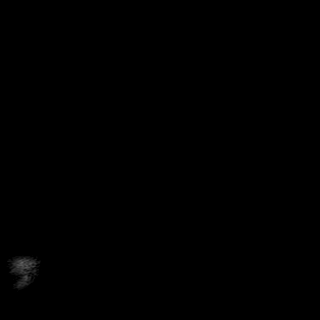
[im 7/20]
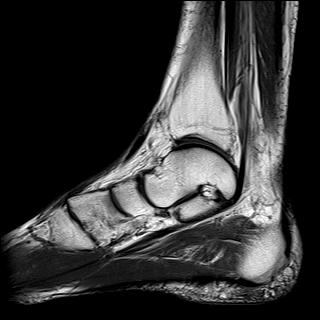
[im 13/20]
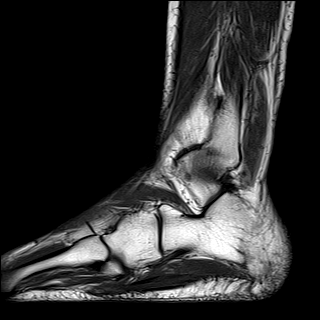
[im 20/20]
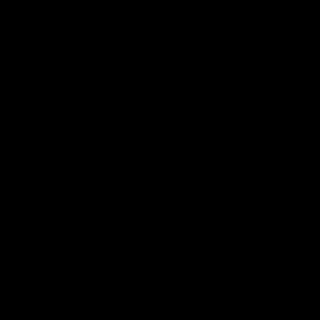

[Series 7: STIR · sagittal · 4.0mm · 0.35mm/px · 5 of 22 slices shown]
[im 1/22]
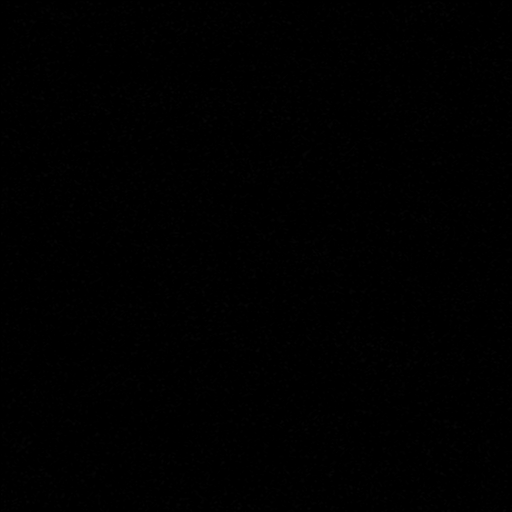
[im 6/22]
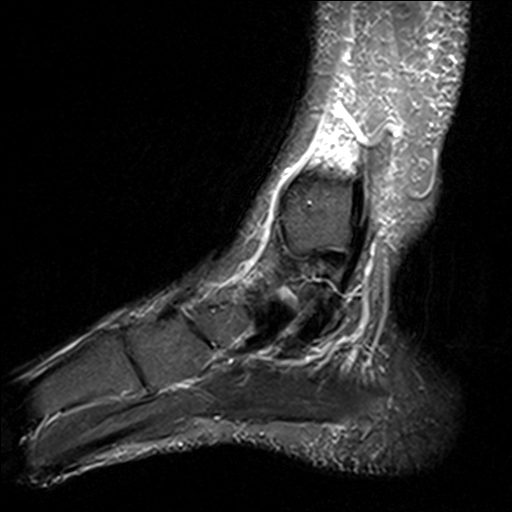
[im 11/22]
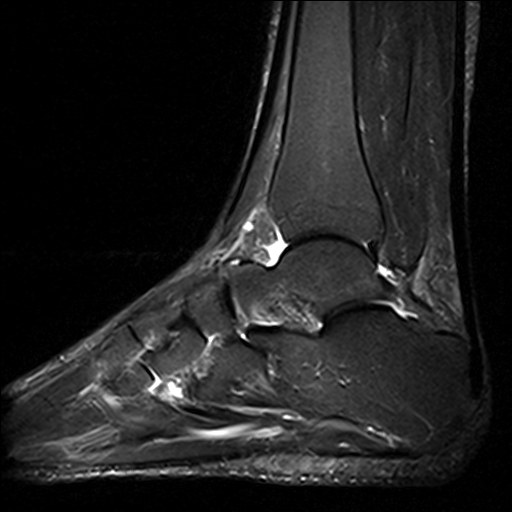
[im 16/22]
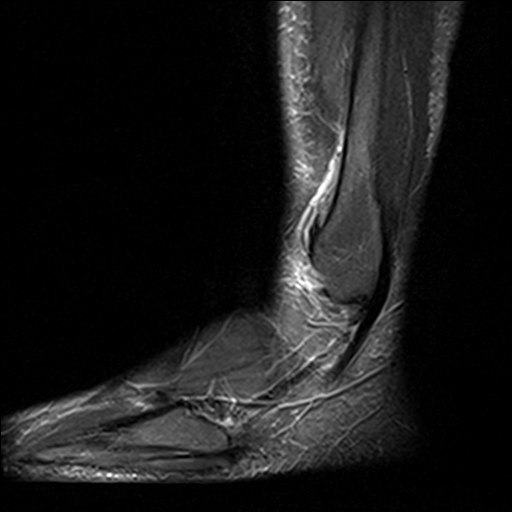
[im 22/22]
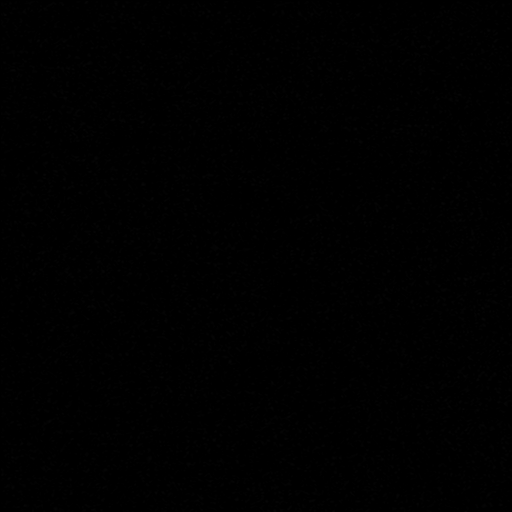

[Series 8: T2 fat-sat · coronal · 3.0mm · 0.50mm/px · 9 of 40 slices shown (2 of 2)]
[im 1/40]
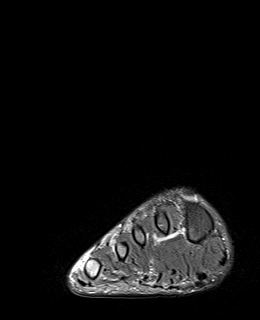
[im 5/40]
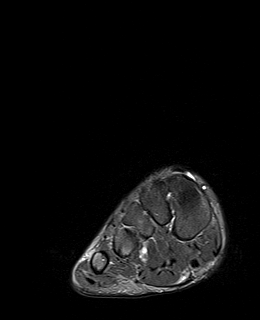
[im 10/40]
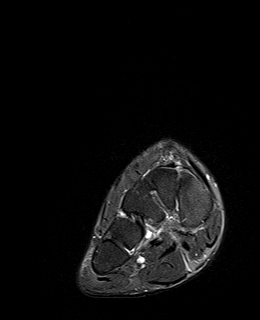
[im 15/40]
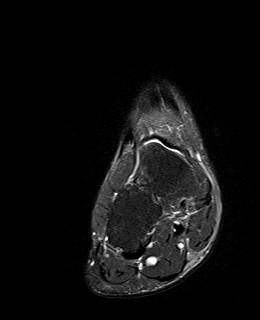
[im 20/40]
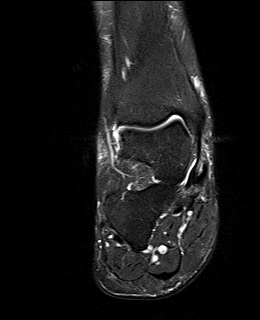
[im 25/40]
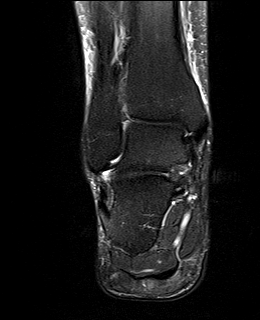
[im 30/40]
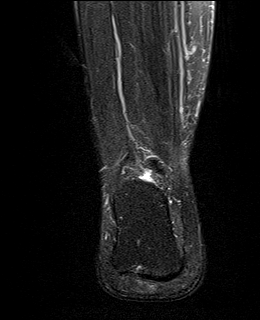
[im 35/40]
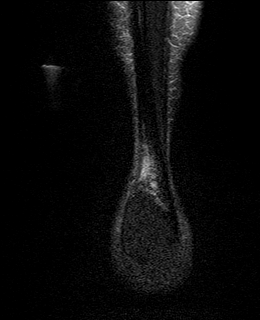
[im 40/40]
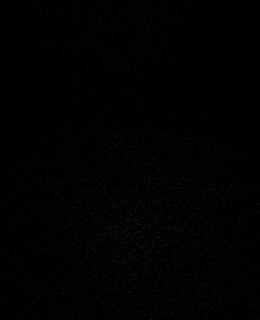

[40 of 40 positions shown; findings below may reference images not displayed]

FINDINGS: TENDONS

Peroneal: Intact.

Posteromedial: Intact.

Anterior: Intact.

Achilles: Intact.

Plantar Fascia: Intact.  Signal within the plantar fascia is normal.

LIGAMENTS

Lateral: There is a mid-substance tear of the anterior talofibular
ligament. The calcaneofibular ligament is also completely torn.
Lateral ligaments are otherwise intact.

Medial: Intact.

CARTILAGE

Ankle Joint: Small focus of cartilage thinning along the medial
talar dome. No underlying bony change.

Subtalar Joints/Sinus Tarsi: Normal.

Bones: Normal signal throughout.

Other: None.
IMPRESSION: Complete ATFL and calcaneofibular ligament tears appear late
subacute to chronic.

Normal appearing plantar fascia.

Small focus of mild cartilage thinning along the medial talar dome
without underlying bony change.

## 2020-06-06 NOTE — Telephone Encounter (Signed)
Pt reports she wants to know what to do with the calcium deposit, cyst? As it is causing more discomfort and she only has 1 prednisone dose left with no relief.Marland KitchenMarland KitchenMarland Kitchen

## 2020-06-07 NOTE — Telephone Encounter (Signed)
Xray was negative. She could get a cock up splint to see if that helps with her right wrist. We could have her see neurology for nerve conduction studies, further eval of carpal tunnel. She could also follow up with Dr. Patsy Lager re: wrist pain.

## 2020-06-12 ENCOUNTER — Telehealth: Payer: Self-pay | Admitting: *Deleted

## 2020-06-12 NOTE — Telephone Encounter (Signed)
Patient has completed her MRI and would like to know her next step,does she schedule F/U appt.? According to Dr Geryl Rankins notes, wants patient to schedule f/u appt. 3 weeks after MRI. Please schedule.

## 2020-06-14 NOTE — Telephone Encounter (Signed)
F/u appt., scheduled w/ Dr Al Corpus

## 2020-06-19 ENCOUNTER — Encounter: Payer: Self-pay | Admitting: Podiatry

## 2020-06-19 ENCOUNTER — Other Ambulatory Visit: Payer: Self-pay

## 2020-06-19 ENCOUNTER — Ambulatory Visit (INDEPENDENT_AMBULATORY_CARE_PROVIDER_SITE_OTHER): Payer: 59 | Admitting: Podiatry

## 2020-06-19 DIAGNOSIS — M722 Plantar fascial fibromatosis: Secondary | ICD-10-CM | POA: Diagnosis not present

## 2020-06-19 MED ORDER — OXAPROZIN 600 MG PO TABS: 600.0000 mg | ORAL_TABLET | Freq: Two times a day (BID) | ORAL | 3 refills | Status: DC

## 2020-06-19 NOTE — Progress Notes (Signed)
She presents today for follow-up of her MRI.  States that my feet are still giving me the same trouble that they were.  She is referring to posterior tibial tendinitis as well as plantar fasciitis and even radiating pain up her leg.  Objective: Vital signs stable alert oriented x3 still has severe pain on palpation medial calcaneal tubercles bilaterally she has pain on palpation of the posterior tibial tendon with inversion against resistance.  She also has a positive Tinel's sign on palpation of the tarsal tunnel.  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  Assessment: Chronic intractable plantar fasciitis not visualized on MRI posterior tibial tendinitis not visualized on MRI.  Plan: At this point I am going to send her to physical therapy and I am going to send the MRI out for another over read.

## 2020-06-20 ENCOUNTER — Encounter: Payer: Self-pay | Admitting: Podiatry

## 2020-06-21 ENCOUNTER — Telehealth: Payer: Self-pay | Admitting: *Deleted

## 2020-06-21 NOTE — Telephone Encounter (Signed)
Received MRI disc from [facility] - sent out in mail to [facility] today.  °

## 2020-07-03 NOTE — Telephone Encounter (Signed)
[facility] called requesting initial MRI report - sent over to Fax: [phone] °

## 2020-07-18 NOTE — Telephone Encounter (Signed)
Called pt-  Informed her of over read results. Recommended PT. Patient states she has already started PT with [facility] and still has 10 more visits left. Advised if no better after completion of PT, then to follow up for further options.

## 2020-07-18 NOTE — Telephone Encounter (Signed)
[facility] report received. Sent to Dr. Hyatt for review. 

## 2020-07-19 ENCOUNTER — Encounter: Payer: Self-pay | Admitting: *Deleted

## 2020-08-01 ENCOUNTER — Encounter: Payer: Self-pay | Admitting: Family Medicine

## 2020-08-01 ENCOUNTER — Telehealth (INDEPENDENT_AMBULATORY_CARE_PROVIDER_SITE_OTHER): Payer: 59 | Admitting: Family Medicine

## 2020-08-01 VITALS — Ht 68.5 in

## 2020-08-01 DIAGNOSIS — R531 Weakness: Secondary | ICD-10-CM

## 2020-08-01 DIAGNOSIS — U071 COVID-19: Secondary | ICD-10-CM

## 2020-08-01 DIAGNOSIS — R059 Cough, unspecified: Secondary | ICD-10-CM | POA: Diagnosis not present

## 2020-08-01 MED ORDER — DOXYCYCLINE HYCLATE 100 MG PO TABS: 100.0000 mg | ORAL_TABLET | Freq: Two times a day (BID) | ORAL | 0 refills | Status: AC

## 2020-08-01 MED ORDER — ALBUTEROL SULFATE HFA 108 (90 BASE) MCG/ACT IN AERS: 2.0000 | INHALATION_SPRAY | RESPIRATORY_TRACT | 0 refills | Status: DC | PRN

## 2020-08-01 NOTE — Progress Notes (Signed)
Kelly Tran T. Kelly Brutus, MD Primary Care and Sports Medicine [facility] [address] [place], [address] Phone: [phone]  FAX: [phone]  Kelly Tran - 55 y.o. female  MRN [id number]  Date of Birth: 02-01-1966  Visit Date: 08/01/2020  PCP: Lorre Munroe, NP  Referred by: Lorre Munroe, NP  Virtual Visit via Video Note:  I connected with  Kelly Tran on 08/01/2020  9:40 AM EST by a video enabled telemedicine application and verified that I am speaking with the correct person using two identifiers.   Location patient: home computer, tablet, or smartphone Location provider: work or home office Consent: Verbal consent directly obtained from [name]. Persons participating in the virtual visit: patient, provider  I discussed the limitations of evaluation and management by telemedicine and the availability of in person appointments. The patient expressed understanding and agreed to proceed.  Chief Complaint  Patient presents with  . Covid Positive    07/16/2020  . Nasal/Head Congestion    Can't get rid of it  . Fatigue  . Tingling    Lips, fingers, nose & toes    History of Present Illness:  Multiple ongoing symptoms. As above. Onset of sx on 07/14/2020.  Feeling tingling in toes, fingers, and all of fingers. That showed up in the last.  Feel No focal weakness. Has no energy. No balance.  No aphasia.   Cough and congestion. 10 -5 years No COPD  Has been taking Allegra D Had a script for cough meds, and this is helping at night.   Review of Systems as above: See pertinent positives and pertinent negatives per HPI No acute distress verbally   Observations/Objective/Exam:  An attempt was made to discern vital signs over the phone and per patient if applicable and possible.   General:    Alert, Oriented, appears well and in no acute distress  Pulmonary:     On inspection no signs of  respiratory distress.  Psych / Neurological:     Pleasant and cooperative.  Assessment and Plan:    ICD-10-CM   1. COVID-19  U07.1   2. Cough  R05.9   3. Weakness  R53.1    The patient had Covid greater than 2 weeks ago.  She has had a continuous cough, this is worsened recently.  Is productive of some sputum.  She denies any frank shortness of breath.  She was given some prednisone after onset of symptoms, and this did not help at all.  She also has some Promethazine DM, and she has an adequate supply at home.  This could be post Covid, however given the length of symptoms this could also be a secondary pneumonia and I think it is most appropriate to treat her with some antibiotics as well.  Albuterol as needed.  I discussed the assessment and treatment plan with the patient. The patient was provided an opportunity to ask questions and all were answered. The patient agreed with the plan and demonstrated an understanding of the instructions.   The patient was advised to call back or seek an in-person evaluation if the symptoms worsen or if the condition fails to improve as anticipated.  Follow-up: prn unless noted otherwise below No follow-ups on file.  Meds ordered this encounter  Medications  . doxycycline (VIBRA-TABS) 100 MG tablet    Sig: Take 1 tablet (100 mg total) by mouth 2 (two) times daily for 10 days.  Dispense:  20 tablet    Refill:  0  . albuterol (VENTOLIN HFA) 108 (90 Base) MCG/ACT inhaler    Sig: Inhale 2 puffs into the lungs every 4 (four) hours as needed for wheezing or shortness of breath.    Dispense:  8 g    Refill:  0   No orders of the defined types were placed in this encounter.   Signed,  Elpidio Galea. Chandrika Sandles, MD

## 2020-08-02 ENCOUNTER — Encounter: Payer: 59 | Admitting: Internal Medicine

## 2020-08-06 ENCOUNTER — Telehealth: Payer: 59 | Admitting: Family Medicine

## 2020-08-08 ENCOUNTER — Encounter: Payer: Self-pay | Admitting: Internal Medicine

## 2020-08-10 ENCOUNTER — Other Ambulatory Visit: Payer: Self-pay

## 2020-08-10 ENCOUNTER — Ambulatory Visit (INDEPENDENT_AMBULATORY_CARE_PROVIDER_SITE_OTHER)
Admission: RE | Admit: 2020-08-10 | Discharge: 2020-08-10 | Disposition: A | Discharge status: Home / Self Care | Payer: 59 | Source: Ambulatory Visit | Attending: Internal Medicine | Admitting: Internal Medicine

## 2020-08-10 ENCOUNTER — Ambulatory Visit (INDEPENDENT_AMBULATORY_CARE_PROVIDER_SITE_OTHER): Payer: 59 | Admitting: Internal Medicine

## 2020-08-10 VITALS — BP 118/76 | HR 81 | Temp 97.1°F | Wt 200.0 lb

## 2020-08-10 DIAGNOSIS — U071 COVID-19: Secondary | ICD-10-CM

## 2020-08-10 DIAGNOSIS — R0789 Other chest pain: Secondary | ICD-10-CM | POA: Diagnosis not present

## 2020-08-10 DIAGNOSIS — R059 Cough, unspecified: Secondary | ICD-10-CM

## 2020-08-10 DIAGNOSIS — R112 Nausea with vomiting, unspecified: Secondary | ICD-10-CM | POA: Diagnosis not present

## 2020-08-10 LAB — CBC
HCT: 38.4 % (ref 36.0–46.0)
Hemoglobin: 12.9 g/dL (ref 12.0–15.0)
MCHC: 33.7 g/dL (ref 30.0–36.0)
MCV: 88.1 fl (ref 78.0–100.0)
Platelets: 246 10*3/uL (ref 150.0–400.0)
RBC: 4.36 Mil/uL (ref 3.87–5.11)
RDW: 12.6 % (ref 11.5–15.5)
WBC: 10 10*3/uL (ref 4.0–10.5)

## 2020-08-10 LAB — COMPREHENSIVE METABOLIC PANEL
ALT: 19 U/L (ref 0–35)
AST: 16 U/L (ref 0–37)
Albumin: 4.1 g/dL (ref 3.5–5.2)
Alkaline Phosphatase: 91 U/L (ref 39–117)
BUN: 9 mg/dL (ref 6–23)
CO2: 30 mEq/L (ref 19–32)
Calcium: 10 mg/dL (ref 8.4–10.5)
Chloride: 103 mEq/L (ref 96–112)
Creatinine, Ser: 0.69 mg/dL (ref 0.40–1.20)
GFR: 96.79 mL/min (ref >60.00)
Glucose, Bld: 105 mg/dL — ABNORMAL HIGH (ref 70–99)
Potassium: 3.7 mEq/L (ref 3.5–5.1)
Sodium: 138 mEq/L (ref 135–145)
Total Bilirubin: 1.1 mg/dL (ref 0.2–1.2)
Total Protein: 7 g/dL (ref 6.0–8.3)

## 2020-08-10 IMAGING — DX DG CHEST 2V
2 series · 2 of 2 positions shown · non-contrast
Comparison: [DATE]

CLINICAL DATA: COVID [PHONE_NUMBER], follow-up

EXAM:
CHEST - 2 VIEW

[chest pa]
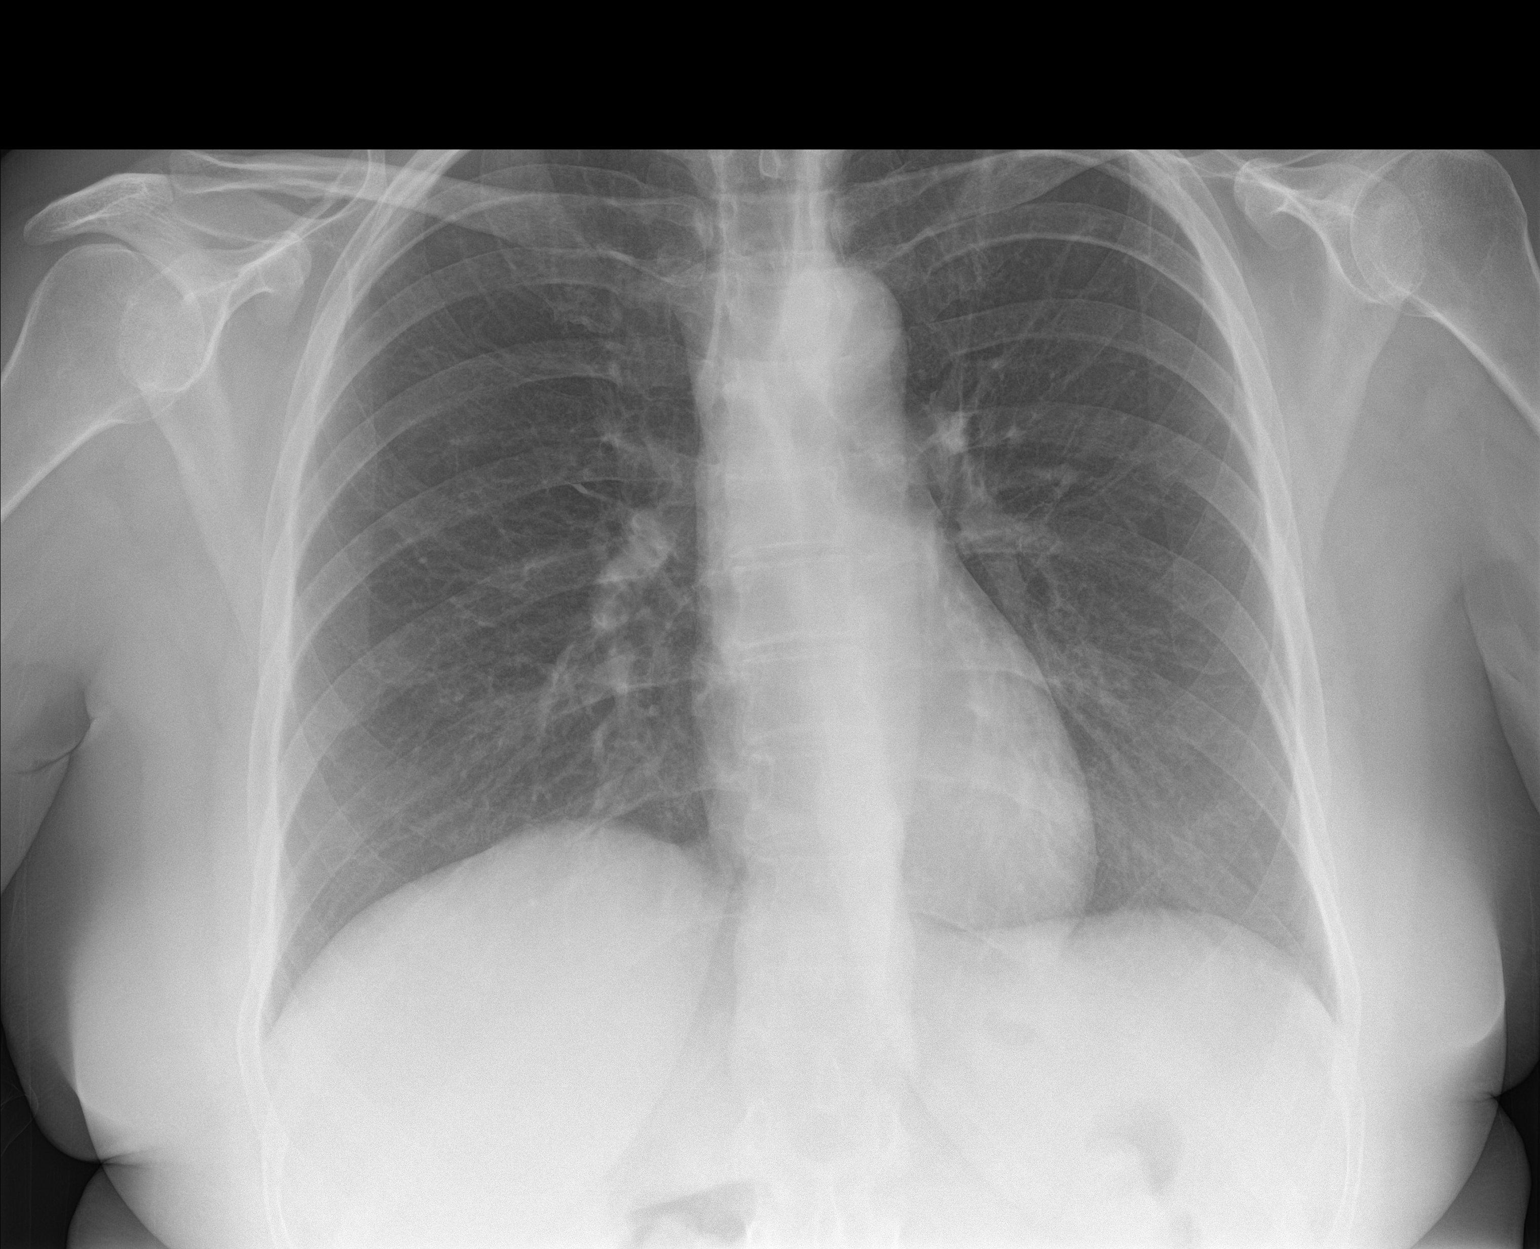

[chest lat]
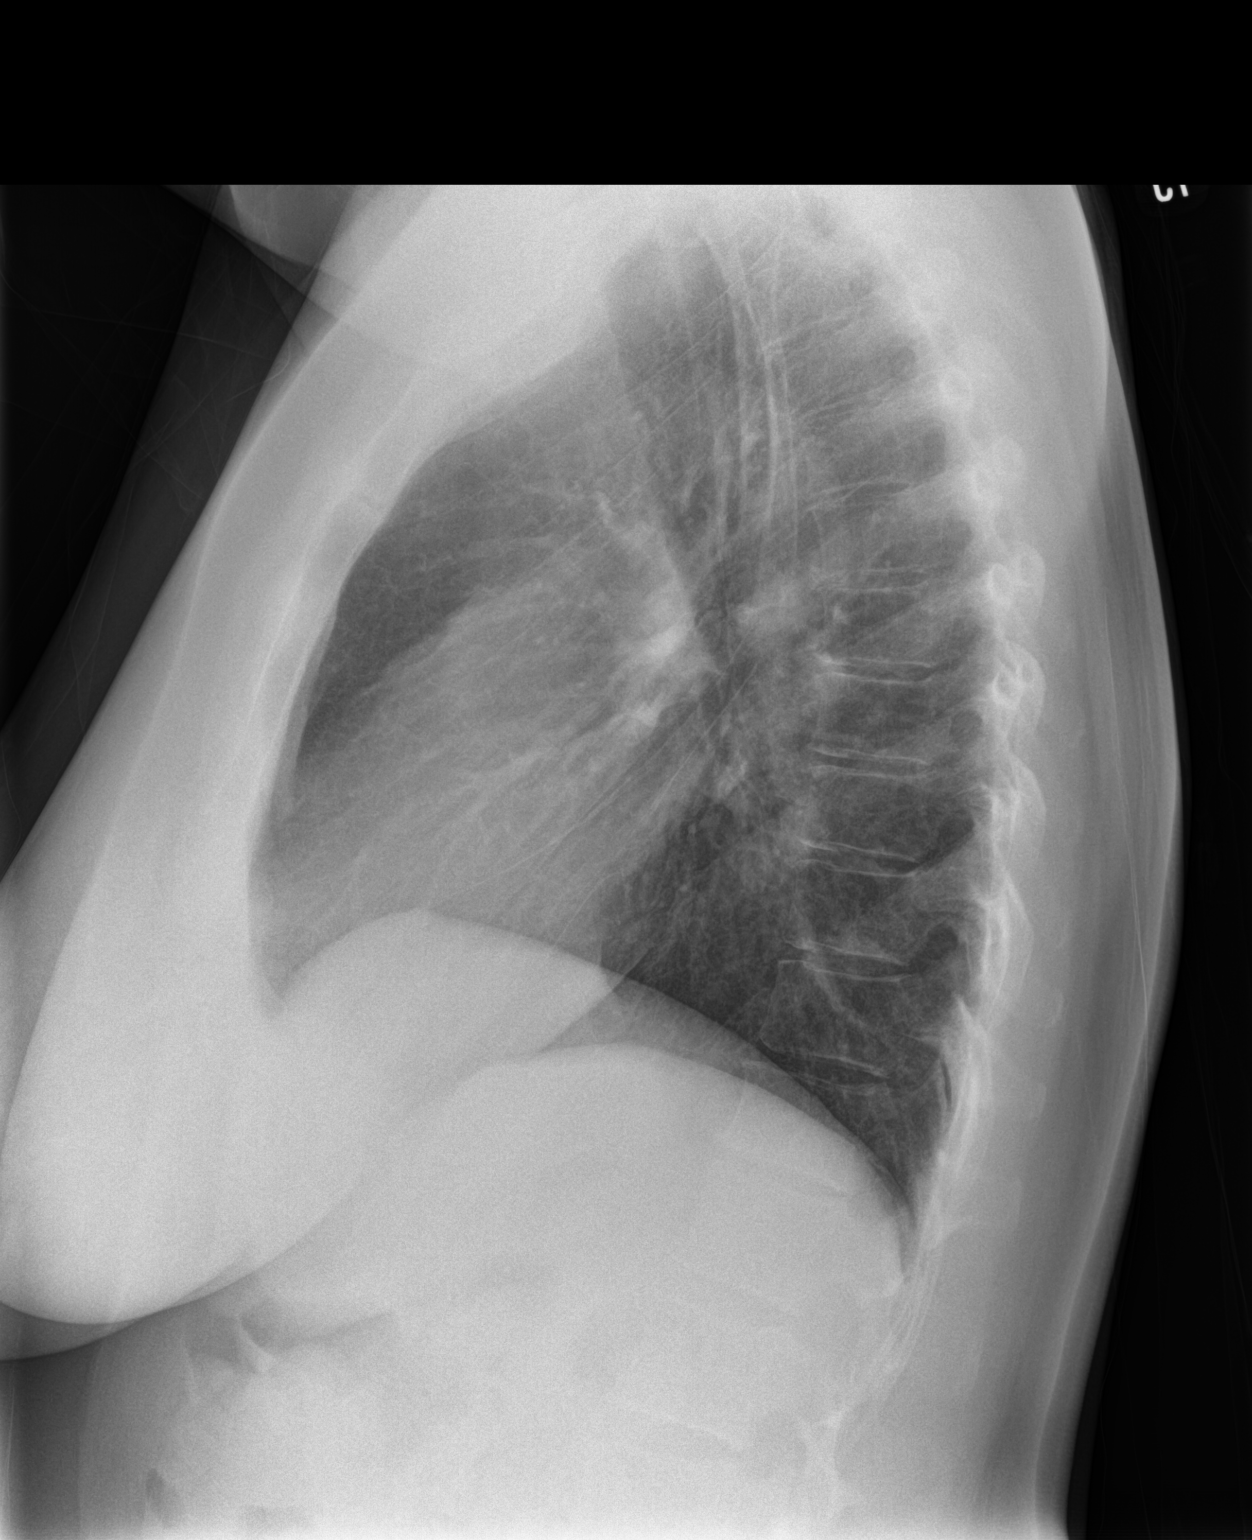

[2 of 2 positions shown; findings below may reference images not displayed]

FINDINGS: The heart size and mediastinal contours are within normal limits.
Both lungs are clear. The visualized skeletal structures are
unremarkable.
IMPRESSION: No active cardiopulmonary disease.

## 2020-08-10 NOTE — Progress Notes (Signed)
Subjective:    Patient ID: Kelly Tran, female    DOB: 05-18-1966, 55 y.o.   MRN: [id number]  HPI  Patient presents to the clinic today with complaint of cough and chest congestion.  She reports she was diagnosed with COVID on 1/11.  The cough is mostly nonproductive but she occasionally will get some yellow mucus up.  She reports some chest heaviness but denies chest pain or shortness of breath.  She has had some associated fevers, nausea and vomiting.  She denies headache, runny nose, nasal congestion, ear pain, sore throat, loss of taste/smell or vomiting.  She was seen 1/20 for the same, given an Rx for Doxycycline, steroids and Albuterol inhaler.  She is also tried Mucinex, Allegra-D without any relief of symptoms.  She has not had her COVID vaccine.   Review of Systems      Past Medical History:  Diagnosis Date  . Allergy   . G E R D 02/22/2007  . Genital warts   . Hemorrhoids, internal 01/31/2011  . Hypothyroidism   . Migraine     Current Outpatient Medications  Medication Sig Dispense Refill  . albuterol (VENTOLIN HFA) 108 (90 Base) MCG/ACT inhaler Inhale 2 puffs into the lungs every 4 (four) hours as needed for wheezing or shortness of breath. 8 g 0  . ARMOUR THYROID 90 MG tablet TAKE 1 TABLET BY MOUTH EVERY DAY 30 tablet 5  . aspirin EC 81 MG tablet Take 81 mg daily by mouth.    Marland Kitchen CALCIUM PO Take 1 tablet daily with lunch by mouth.    . Cholecalciferol (VITAMIN D PO) Take 500 Units daily by mouth.    . Cyanocobalamin (VITAMIN B-12 PO) Take 1 tablet daily with lunch by mouth.    . cyclobenzaprine (FLEXERIL) 10 MG tablet Take 1 tablet (10 mg total) by mouth 3 (three) times daily as needed for muscle spasms. 30 tablet 0  . diclofenac (VOLTAREN) 75 MG EC tablet Take 1 tablet (75 mg total) by mouth 2 (two) times daily. 60 tablet 1  . doxycycline (VIBRA-TABS) 100 MG tablet Take 1 tablet (100 mg total) by mouth 2 (two) times daily for 10 days. 20 tablet 0  . Fexofenadine HCl  (ALLEGRA PO) Take 1 tablet daily as needed by mouth (seasonal allergies).    . fluticasone (FLONASE) 50 MCG/ACT nasal spray Place 2 sprays into both nostrils daily. 16 g 0  . loratadine (CLARITIN) 10 MG tablet Take 10 mg by mouth daily.    . Multiple Vitamin (MULTIVITAMIN WITH MINERALS) TABS tablet Take 1 tablet daily with lunch by mouth.    Marland Kitchen oxaprozin (DAYPRO) 600 MG tablet Take 1 tablet (600 mg total) by mouth in the morning and at bedtime. 60 tablet 3  . promethazine-dextromethorphan (PROMETHAZINE-DM) 6.25-15 MG/5ML syrup Take by mouth.    . Pyridoxine HCl (VITAMIN B-6 PO) Take 1 tablet daily with lunch by mouth.     No current facility-administered medications for this visit.    No Active Allergies  Family History  Problem Relation Age of Onset  . Stroke Mother   . Arthritis Mother   . Hypertension Father   . Arthritis Father   . Hyperlipidemia Father   . Cancer Maternal Aunt        ovarian  . Arthritis Maternal Grandmother   . Arthritis Maternal Grandfather   . Arthritis Paternal Grandmother   . Arthritis Paternal Grandfather   . Cancer Maternal Aunt  Breast  . Cancer Maternal Aunt        Lung  . Diabetes Neg Hx        family  . Stomach cancer Neg Hx   . Colon cancer Neg Hx   . Pancreatic cancer Neg Hx   . Esophageal cancer Neg Hx   . Rectal cancer Neg Hx     Social History   Socioeconomic History  . Marital status: Married    Spouse name: Not on file  . Number of children: Not on file  . Years of education: Not on file  . Highest education level: Not on file  Occupational History  . Not on file  Tobacco Use  . Smoking status: Former Smoker    Quit date: 01/31/1996    Years since quitting: 24.5  . Smokeless tobacco: Never Used  Vaping Use  . Vaping Use: Never used  Substance and Sexual Activity  . Alcohol use: Yes    Alcohol/week: 0.0 standard drinks    Comment: occ  . Drug use: No  . Sexual activity: Yes    Partners: Male  Other Topics  Concern  . Not on file  Social History Narrative  . Not on file   Social Determinants of Health   Financial Resource Strain: Not on file  Food Insecurity: Not on file  Transportation Needs: Not on file  Physical Activity: Not on file  Stress: Not on file  Social Connections: Not on file  Intimate Partner Violence: Not on file     Constitutional: Denies fever, malaise, fatigue, headache or abrupt weight changes.  HEENT: Denies eye pain, eye redness, ear pain, ringing in the ears, wax buildup, runny nose, nasal congestion, bloody nose, or sore throat. Respiratory: Patient reports cough and chest congestion.  Denies difficulty breathing, shortness of breath..   Cardiovascular: Patient reports chest heaviness.  Denies chest pain, chest tightness, palpitations or swelling in the hands or feet.  Gastrointestinal: Patient reports nausea and vomiting.  Denies abdominal pain, bloating, constipation, diarrhea or blood in the stool.   No other specific complaints in a complete review of systems (except as listed in HPI above).  Objective:   Physical Exam  BP 118/76   Pulse 81   Temp (!) 97.1 F (36.2 C) (Temporal)   Wt 200 lb (90.7 kg)   LMP 03/11/2013   SpO2 97%   BMI 29.97 kg/m   Wt Readings from Last 3 Encounters:  06/05/20 202 lb (91.6 kg)  12/15/19 199 lb (90.3 kg)  10/06/19 205 lb 8 oz (93.2 kg)    General: Appears her stated age, in NAD. HEENT: Head: normal shape and size; Nose: Congestion noted; Throat/Mouth: Hoarseness noted.  Neck: No adenopathy noted Cardiovascular: Normal rate and rhythm. S1,S2 noted.  No murmur, rubs or gallops noted.  Pulmonary/Chest: Normal effort and positive vesicular breath sounds. No respiratory distress. No wheezes, rales or ronchi noted.  Neurological: Alert and oriented.    BMET    Component Value Date/Time   NA 139 05/21/2018 0907   K 4.3 05/21/2018 0907   CL 101 05/21/2018 0907   CO2 29 05/21/2018 0907   GLUCOSE 91 05/21/2018  0907   BUN 20 05/21/2018 0907   CREATININE 0.82 05/21/2018 0907   CALCIUM 10.1 05/21/2018 0907   GFRNONAA >60 05/23/2017 0222   GFRAA >60 05/23/2017 0222    Lipid Panel     Component Value Date/Time   CHOL 161 05/21/2018 0907   TRIG 44.0 05/21/2018 0907  HDL 90.50 05/21/2018 0907   CHOLHDL 2 05/21/2018 0907   VLDL 8.8 05/21/2018 0907   LDLCALC 61 05/21/2018 0907    CBC    Component Value Date/Time   WBC 5.9 05/21/2018 0907   RBC 4.72 05/21/2018 0907   HGB 14.4 05/21/2018 0907   HCT 41.9 05/21/2018 0907   PLT 303.0 05/21/2018 0907   MCV 88.6 05/21/2018 0907   MCH 29.7 05/22/2017 2125   MCHC 34.3 05/21/2018 0907   RDW 12.5 05/21/2018 0907   LYMPHSABS 3.2 05/03/2013 1127   MONOABS 0.7 05/03/2013 1127   EOSABS 0.2 05/03/2013 1127   BASOSABS 0.0 05/03/2013 1127    Hgb A1C No results found for: HGBA1C         Assessment & Plan:   Cough, Chest Heaviness, Nausea, Vomiting secondary to COVID-19:  We will check CBC, c-Met and D-dimer today We will obtain chest x-ray No indication based on exam for additional antibiotics, steroids, inhalers or cough syrups  ER precautions discussed, we will follow-up after labs and imaging   Webb Silversmith, NP This visit occurred during the SARS-CoV-2 public health emergency.  Safety protocols were in place, including screening questions prior to the visit, additional usage of staff PPE, and extensive cleaning of exam room while observing appropriate contact time as indicated for disinfecting solutions.

## 2020-08-11 LAB — D-DIMER, QUANTITATIVE: D-Dimer, Quant: 0.24 mcg/mL FEU (ref <0.50)

## 2020-08-13 ENCOUNTER — Encounter: Payer: Self-pay | Admitting: Internal Medicine

## 2020-08-13 NOTE — Patient Instructions (Signed)
COVID-19 Quarantine vs. Isolation QUARANTINE keeps someone who was in close contact with someone who has COVID-19 away from others. Quarantine if you have been in close contact with someone who has COVID-19, unless you have been fully vaccinated. If you are fully vaccinated  You do NOT need to quarantine unless they have symptoms  Get tested 3-5 days after your exposure, even if you don't have symptoms  Wear a mask indoors in public for 14 days following exposure or until your test result is negative If you are not fully vaccinated  Stay home for 14 days after your last contact with a person who has COVID-19  Watch for fever (100.4F), cough, shortness of breath, or other symptoms of COVID-19  If possible, stay away from people you live with, especially people who are at higher risk for getting very sick from COVID-19  Contact your local public health department for options in your area to possibly shorten your quarantine ISOLATION keeps someone who is sick or tested positive for COVID-19 without symptoms away from others, even in their own home. People who are in isolation should stay home and stay in a specific "sick room" or area and use a separate bathroom (if available). If you are sick and think or know you have COVID-19 Stay home until after  At least 10 days since symptoms first appeared and  At least 24 hours with no fever without the use of fever-reducing medications and  Symptoms have improved If you tested positive for COVID-19 but do not have symptoms  Stay home until after 10 days have passed since your positive viral test  If you develop symptoms after testing positive, follow the steps above for those who are sick cdc.gov/coronavirus 04/02/2020 This information is not intended to replace advice given to you by your health care provider. Make sure you discuss any questions you have with your health care provider. Document Revised: 05/07/2020 Document Reviewed:  05/07/2020 Elsevier Patient Education  2021 Elsevier Inc.  

## 2020-08-14 ENCOUNTER — Ambulatory Visit: Payer: 59 | Admitting: Internal Medicine

## 2020-08-14 ENCOUNTER — Encounter: Payer: 59 | Admitting: Internal Medicine

## 2020-08-23 ENCOUNTER — Other Ambulatory Visit: Payer: Self-pay | Admitting: Family Medicine

## 2020-09-04 ENCOUNTER — Other Ambulatory Visit: Payer: Self-pay | Admitting: Internal Medicine

## 2020-09-17 ENCOUNTER — Encounter: Payer: Self-pay | Admitting: Internal Medicine

## 2020-09-17 ENCOUNTER — Ambulatory Visit (INDEPENDENT_AMBULATORY_CARE_PROVIDER_SITE_OTHER): Payer: 59 | Admitting: Internal Medicine

## 2020-09-17 ENCOUNTER — Other Ambulatory Visit: Payer: Self-pay

## 2020-09-17 VITALS — BP 126/74 | HR 81 | Temp 97.8°F | Ht 69.0 in | Wt 205.0 lb

## 2020-09-17 DIAGNOSIS — G43C1 Periodic headache syndromes in child or adult, intractable: Secondary | ICD-10-CM

## 2020-09-17 DIAGNOSIS — Z0000 Encounter for general adult medical examination without abnormal findings: Secondary | ICD-10-CM | POA: Diagnosis not present

## 2020-09-17 DIAGNOSIS — E063 Autoimmune thyroiditis: Secondary | ICD-10-CM

## 2020-09-17 DIAGNOSIS — Z0001 Encounter for general adult medical examination with abnormal findings: Secondary | ICD-10-CM

## 2020-09-17 DIAGNOSIS — K219 Gastro-esophageal reflux disease without esophagitis: Secondary | ICD-10-CM

## 2020-09-17 DIAGNOSIS — E038 Other specified hypothyroidism: Secondary | ICD-10-CM | POA: Diagnosis not present

## 2020-09-17 NOTE — Progress Notes (Signed)
Subjective:    Patient ID: Kelly Tran, female    DOB: Jan 19, 1966, 55 y.o.   MRN: [id number]  HPI  Pt presents to the clinic today for her annual exam. She is also due to follow up chronic conditions.   Migraines: Currently not an issues. She is not following the neurology.  GERD: She takes OTC Prilosec as needed with good relief of symptoms. Upper GI from 08/2008 reviewed.  Hypothyroidism: Managed on Armour Thyroid. She does not follow with endocrinology.  Flu: never Tetanus: 04/2009 Zostovax: never Shingrix: never Covid: never Pap Smear: 06/2020 Mammogram: 08/2019 Bone Density: never Colon Screening: 06/2019 Vision Screening: annually Dentist: biannually  Diet: She does eat meat. She consumes fruits and veggies. She tries to avoid fried foods. She drinks mostly water. Exercise: None   Review of Systems  Past Medical History:  Diagnosis Date  . Allergy   . G E R D 02/22/2007  . Genital warts   . Hemorrhoids, internal 01/31/2011  . Hypothyroidism   . Migraine     Current Outpatient Medications  Medication Sig Dispense Refill  . albuterol (VENTOLIN HFA) 108 (90 Base) MCG/ACT inhaler Inhale 2 puffs into the lungs every 4 (four) hours as needed for wheezing or shortness of breath. 8 g 0  . ARMOUR THYROID 90 MG tablet TAKE 1 TABLET BY MOUTH EVERY DAY 90 tablet 1  . aspirin EC 81 MG tablet Take 81 mg daily by mouth.    Marland Kitchen CALCIUM PO Take 1 tablet daily with lunch by mouth.    . Cholecalciferol (VITAMIN D PO) Take 500 Units daily by mouth.    . Cyanocobalamin (VITAMIN B-12 PO) Take 1 tablet daily with lunch by mouth.    . cyclobenzaprine (FLEXERIL) 10 MG tablet Take 1 tablet (10 mg total) by mouth 3 (three) times daily as needed for muscle spasms. 30 tablet 0  . diclofenac (VOLTAREN) 75 MG EC tablet Take 1 tablet (75 mg total) by mouth 2 (two) times daily. 60 tablet 1  . Fexofenadine HCl (ALLEGRA PO) Take 1 tablet daily as needed by mouth (seasonal allergies).    .  fluticasone (FLONASE) 50 MCG/ACT nasal spray Place 2 sprays into both nostrils daily. 16 g 0  . loratadine (CLARITIN) 10 MG tablet Take 10 mg by mouth daily.    . Multiple Vitamin (MULTIVITAMIN WITH MINERALS) TABS tablet Take 1 tablet daily with lunch by mouth.    Marland Kitchen oxaprozin (DAYPRO) 600 MG tablet Take 1 tablet (600 mg total) by mouth in the morning and at bedtime. 60 tablet 3  . promethazine-dextromethorphan (PROMETHAZINE-DM) 6.25-15 MG/5ML syrup Take by mouth.    . Pyridoxine HCl (VITAMIN B-6 PO) Take 1 tablet daily with lunch by mouth.     No current facility-administered medications for this visit.    No Known Allergies  Family History  Problem Relation Age of Onset  . Stroke Mother   . Arthritis Mother   . Hypertension Father   . Arthritis Father   . Hyperlipidemia Father   . Cancer Maternal Aunt        ovarian  . Arthritis Maternal Grandmother   . Arthritis Maternal Grandfather   . Arthritis Paternal Grandmother   . Arthritis Paternal Grandfather   . Cancer Maternal Aunt        Breast  . Cancer Maternal Aunt        Lung  . Diabetes Neg Hx        family  . Stomach cancer Neg  Hx   . Colon cancer Neg Hx   . Pancreatic cancer Neg Hx   . Esophageal cancer Neg Hx   . Rectal cancer Neg Hx     Social History   Socioeconomic History  . Marital status: Married    Spouse name: Not on file  . Number of children: Not on file  . Years of education: Not on file  . Highest education level: Not on file  Occupational History  . Not on file  Tobacco Use  . Smoking status: Former Smoker    Quit date: 01/31/1996    Years since quitting: 24.6  . Smokeless tobacco: Never Used  Vaping Use  . Vaping Use: Never used  Substance and Sexual Activity  . Alcohol use: Yes    Alcohol/week: 0.0 standard drinks    Comment: occ  . Drug use: No  . Sexual activity: Yes    Partners: Male  Other Topics Concern  . Not on file  Social History Narrative  . Not on file   Social  Determinants of Health   Financial Resource Strain: Not on file  Food Insecurity: Not on file  Transportation Needs: Not on file  Physical Activity: Not on file  Stress: Not on file  Social Connections: Not on file  Intimate Partner Violence: Not on file     Constitutional:  Denies fever, malaise, fatigue, headache or abrupt weight changes.  HEENT: Denies eye pain, eye redness, ear pain, ringing in the ears, wax buildup, runny nose, nasal congestion, bloody nose, or sore throat. Respiratory: Denies difficulty breathing, shortness of breath, cough or sputum production.   Cardiovascular: Denies chest pain, chest tightness, palpitations or swelling in the hands or feet.  Gastrointestinal: Pt reports intermittent reflux. Denies abdominal pain, bloating, constipation, diarrhea or blood in the stool.  GU: Denies urgency, frequency, pain with urination, burning sensation, blood in urine, odor or discharge. Musculoskeletal: Denies decrease in range of motion, difficulty with gait, muscle pain or joint pain and swelling.  Skin: Denies redness, rashes, lesions or ulcercations.  Neurological: Denies dizziness, difficulty with memory, difficulty with speech or problems with balance and coordination.  Psych: Denies anxiety, depression, SI/HI.  No other specific complaints in a complete review of systems (except as listed in HPI above).     Objective:   Physical Exam  BP 126/74   Pulse 81   Temp 97.8 F (36.6 C) (Temporal)   Ht 5\' 9"  (1.753 m)   Wt 205 lb (93 kg)   LMP 03/11/2013   SpO2 98%   BMI 30.27 kg/m   Wt Readings from Last 3 Encounters:  08/10/20 200 lb (90.7 kg)  06/05/20 202 lb (91.6 kg)  12/15/19 199 lb (90.3 kg)    General: Appears her stated age, obese, in NAD. Skin: Warm, dry and intact. No rashes, lesions or ulcerations noted. HEENT: Head: normal shape and size; Eyes: sclera white, no icterus, conjunctiva pink, PERRLA and EOMs intact; Neck:  Neck supple, trachea  midline. No masses, lumps or thyromegaly present.  Cardiovascular: Normal rate and rhythm. S1,S2 noted.  No murmur, rubs or gallops noted. No JVD or BLE edema. No carotid bruits noted. Pulmonary/Chest: Normal effort and positive vesicular breath sounds. No respiratory distress. No wheezes, rales or ronchi noted.  Abdomen: Soft and nontender. Normal bowel sounds. No distention or masses noted. Liver, spleen and kidneys non palpable. Musculoskeletal: Strength 5/5 BUE/BLE. No difficulty with gait.  Neurological: Alert and oriented. Cranial nerves II-XII grossly intact. Coordination normal.  Psychiatric: Mood and affect normal. Behavior is normal. Judgment and thought content normal.    BMET    Component Value Date/Time   NA 138 08/10/2020 1330   K 3.7 08/10/2020 1330   CL 103 08/10/2020 1330   CO2 30 08/10/2020 1330   GLUCOSE 105 (H) 08/10/2020 1330   BUN 9 08/10/2020 1330   CREATININE 0.69 08/10/2020 1330   CALCIUM 10.0 08/10/2020 1330   GFRNONAA >60 05/23/2017 0222   GFRAA >60 05/23/2017 0222    Lipid Panel     Component Value Date/Time   CHOL 161 05/21/2018 0907   TRIG 44.0 05/21/2018 0907   HDL 90.50 05/21/2018 0907   CHOLHDL 2 05/21/2018 0907   VLDL 8.8 05/21/2018 0907   LDLCALC 61 05/21/2018 0907    CBC    Component Value Date/Time   WBC 10.0 08/10/2020 1330   RBC 4.36 08/10/2020 1330   HGB 12.9 08/10/2020 1330   HCT 38.4 08/10/2020 1330   PLT 246.0 08/10/2020 1330   MCV 88.1 08/10/2020 1330   MCH 29.7 05/22/2017 2125   MCHC 33.7 08/10/2020 1330   RDW 12.6 08/10/2020 1330   LYMPHSABS 3.2 05/03/2013 1127   MONOABS 0.7 05/03/2013 1127   EOSABS 0.2 05/03/2013 1127   BASOSABS 0.0 05/03/2013 1127    Hgb A1C No results found for: HGBA1C          Assessment & Plan:   Preventative Health Maintenance:  She declines flu shot today She declines tetanus booster oday Encouraged her to get a Shingrix vaccine Encouraged her get a Covid vaccine Pap smear UTD-  will request copy Mammogram UTD- will request copy Colon screening UTD Encouraged her to consume a balanced diet and exercise regimen Advised her to see an eye doctor and dentist annually Will check CBC, CMET, TSH, Free T4, lipid and A1C today  RTC in 1 year, sooner if needed Nicki Reaper, NP This visit occurred during the SARS-CoV-2 public health emergency.  Safety protocols were in place, including screening questions prior to the visit, additional usage of staff PPE, and extensive cleaning of exam room while observing appropriate contact time as indicated for disinfecting solutions.

## 2020-09-17 NOTE — Assessment & Plan Note (Signed)
Continue Prilosec OTC CBC and CMET today

## 2020-09-17 NOTE — Assessment & Plan Note (Signed)
TSH and free T4 today Continue Armour Thyroid

## 2020-09-17 NOTE — Patient Instructions (Signed)

## 2020-09-18 LAB — HEMOGLOBIN A1C: Hgb A1c MFr Bld: 5.4 % (ref 4.6–6.5)

## 2020-09-18 LAB — CBC
HCT: 37.8 % (ref 36.0–46.0)
Hemoglobin: 12.8 g/dL (ref 12.0–15.0)
MCHC: 33.8 g/dL (ref 30.0–36.0)
MCV: 87.8 fl (ref 78.0–100.0)
Platelets: 268 10*3/uL (ref 150.0–400.0)
RBC: 4.31 Mil/uL (ref 3.87–5.11)
RDW: 12.8 % (ref 11.5–15.5)
WBC: 7.2 10*3/uL (ref 4.0–10.5)

## 2020-09-18 LAB — COMPREHENSIVE METABOLIC PANEL
ALT: 25 U/L (ref 0–35)
AST: 19 U/L (ref 0–37)
Albumin: 4.1 g/dL (ref 3.5–5.2)
Alkaline Phosphatase: 82 U/L (ref 39–117)
BUN: 18 mg/dL (ref 6–23)
CO2: 28 mEq/L (ref 19–32)
Calcium: 9.4 mg/dL (ref 8.4–10.5)
Chloride: 105 mEq/L (ref 96–112)
Creatinine, Ser: 0.8 mg/dL (ref 0.40–1.20)
GFR: 82.11 mL/min (ref >60.00)
Glucose, Bld: 101 mg/dL — ABNORMAL HIGH (ref 70–99)
Potassium: 3.9 mEq/L (ref 3.5–5.1)
Sodium: 140 mEq/L (ref 135–145)
Total Bilirubin: 1 mg/dL (ref 0.2–1.2)
Total Protein: 6.2 g/dL (ref 6.0–8.3)

## 2020-09-18 LAB — TSH: TSH: 0.11 u[IU]/mL — ABNORMAL LOW (ref 0.35–4.50)

## 2020-09-18 LAB — LIPID PANEL
Cholesterol: 156 mg/dL (ref 0–200)
HDL: 80.4 mg/dL (ref >39.00)
LDL Cholesterol: 47 mg/dL (ref 0–99)
NonHDL: 75.56
Total CHOL/HDL Ratio: 2
Triglycerides: 145 mg/dL (ref 0.0–149.0)
VLDL: 29 mg/dL (ref 0.0–40.0)

## 2020-09-18 LAB — T4, FREE: Free T4: 0.66 ng/dL (ref 0.60–1.60)

## 2020-09-26 ENCOUNTER — Encounter: Payer: Self-pay | Admitting: Internal Medicine

## 2020-09-26 MED ORDER — ARMOUR THYROID 60 MG PO TABS: 60.0000 mg | ORAL_TABLET | Freq: Every day | ORAL | 0 refills | Status: DC

## 2020-10-18 ENCOUNTER — Other Ambulatory Visit: Payer: Self-pay | Admitting: Podiatry

## 2020-10-18 NOTE — Telephone Encounter (Signed)
Please advise 

## 2020-12-14 ENCOUNTER — Ambulatory Visit (INDEPENDENT_AMBULATORY_CARE_PROVIDER_SITE_OTHER): Payer: 59 | Admitting: Internal Medicine

## 2020-12-14 ENCOUNTER — Encounter: Payer: Self-pay | Admitting: Internal Medicine

## 2020-12-14 ENCOUNTER — Other Ambulatory Visit: Payer: Self-pay

## 2020-12-14 VITALS — BP 128/82 | HR 94 | Ht 69.0 in | Wt 182.0 lb

## 2020-12-14 DIAGNOSIS — E538 Deficiency of other specified B group vitamins: Secondary | ICD-10-CM | POA: Diagnosis not present

## 2020-12-14 DIAGNOSIS — E063 Autoimmune thyroiditis: Secondary | ICD-10-CM | POA: Diagnosis not present

## 2020-12-14 DIAGNOSIS — E038 Other specified hypothyroidism: Secondary | ICD-10-CM

## 2020-12-14 DIAGNOSIS — E559 Vitamin D deficiency, unspecified: Secondary | ICD-10-CM

## 2020-12-14 LAB — T4, FREE: Free T4: 0.82 ng/dL (ref 0.60–1.60)

## 2020-12-14 LAB — TSH: TSH: 0.26 u[IU]/mL — ABNORMAL LOW (ref 0.35–4.50)

## 2020-12-14 LAB — VITAMIN B12: Vitamin B-12: 350 pg/mL (ref 211–911)

## 2020-12-14 LAB — T3, FREE: T3, Free: 4.7 pg/mL — ABNORMAL HIGH (ref 2.3–4.2)

## 2020-12-14 NOTE — Progress Notes (Signed)
Patient ID: Kelly Tran, female   DOB: [birth year], 55 y.o.   MRN: [id number]   This visit occurred during the SARS-CoV-2 public health emergency.  Safety protocols were in place, including screening questions prior to the visit, additional usage of staff PPE, and extensive cleaning of exam room while observing appropriate contact time as indicated for disinfecting solutions.   HPI  Kelly Tran is a 55 y.o.-year-old female, initially referred by her PCP, Kelly Munroe, NP, returning for follow-up for Hashimoto's hypothyroidism.  He previously saw Dr. Lucianne Tran, last visit 01/2018.  I first saw the patient in 07/2018.  Last visit with me 1 year ago.  Interim history: She had COVID-19 in 08/2020.  She feels better now. She recently has more energy, no hair loss, lost 20 lbs in last 2-3 months. On Keto diet >> now more inclusive.  Reviewed history: Pt. has been dx with hypothyroidism in 11/2011.  She was initially started on levothyroxine, but she did not tolerate this well.  She then was changed to Armour in 07/2012 and 45 mg daily, then increase to 60 mg daily in 06/2015.  However, she developed palpitations (stopped after stopping amitriptyline).  Her medication was changed to levothyroxine 50 mcg + liothyronine 10 mg daily in 02/2017.  This was increased to 8 tablets of levothyroxine and 14 tablets of liothyronine a week in 05/2018.  At that time, TSH was 3.77.  However, she was complaining of fatigue, cold intolerance, hair loss, and constipation and we switched to Armour thyroid.  Her symptoms improved on this, with the exception of hair loss.  She continues on Armour 90 mg daily: - in am - fasting - at least 30 min from b'fast - no iron, PPIs - + calcium, multivitamins - at night - +on Biotin 10,000 mcg daily - off for 1 month On Ashwaghanda, Mg - for sleep.  Reviewed her TFTs: Lab Results  Component Value Date   TSH 0.11 (L) 09/17/2020   TSH 0.59 12/15/2019   TSH 0.27 (L)  06/20/2019   TSH 1.26 11/10/2018   TSH 6.91 (H) 09/21/2018   TSH 7.30 (H) 08/06/2018   TSH 0.84 06/22/2018   TSH 3.77 05/21/2018   TSH 2.41 02/02/2018   TSH 2.08 01/12/2018   FREET4 0.66 09/17/2020   FREET4 0.69 12/15/2019   FREET4 0.80 06/20/2019   FREET4 0.56 (L) 11/10/2018   FREET4 0.57 (L) 09/21/2018   FREET4 0.58 (L) 08/06/2018   FREET4 0.77 06/22/2018   FREET4 0.69 05/21/2018   FREET4 0.79 01/12/2018   FREET4 0.74 06/25/2017   T3FREE 4.4 (H) 12/15/2019   T3FREE 4.7 (H) 06/20/2019   T3FREE 2.8 11/10/2018   T3FREE 3.8 09/21/2018   T3FREE 3.3 08/06/2018   T3FREE 3.7 06/22/2018   T3FREE 3.3 01/12/2018   T3FREE 4.1 06/25/2017   T3FREE 3.0 03/25/2017   T3FREE 2.3 02/05/2017   Her TPO antibodies are elevated: Component     Latest Ref Rng & Units 06/22/2018 11/10/2018  Thyroglobulin Ab     < or = 1 IU/mL <1   Thyroperoxidase Ab SerPl-aCnc     <9 IU/mL 822 (H) 770 (H)   We started selenium 200 mcg daily in 07/2018.  She also started to improve her diet to reduce gluten, nuts, dairy. Now off Se for few days.  Pt denies: - feeling nodules in neck - hoarseness - dysphagia - choking - SOB with lying down  She has no FH of thyroid disorders.No FH of thyroid cancer.  No h/o radiation tx to head or neck.  No herbal supplements. No recent steroids use.   Pt. also has a history of GERD, multiple pregnancy losses, low vitamin B-12 (on liquid b12  -dose decreased from 10,000 to 5000 units daily in 12/2019) and D (on vitamin D 4000 units daily), and also low testosterone.  Lab Results  Component Value Date   VITAMINB12 >1526 (H) 12/15/2019   VITAMINB12 357 05/21/2018   VITAMINB12 220 02/02/2018   VITAMINB12 392 07/15/2016   VITAMINB12 452 06/28/2015   VITAMINB12 295 08/08/2008   Lab Results  Component Value Date   VD25OH 60.9 12/15/2019   VD25OH 32.55 05/21/2018   VD25OH 22.45 (L) 02/02/2018   VD25OH 25.22 (L) 07/15/2016   VD25OH 23.18 (L) 11/20/2015   VD25OH 38.23  09/26/2015   VD25OH 22.84 (L) 06/28/2015   ROS: Constitutional: no weight gain/+ weight loss (net 17 lbs since last visit), no fatigue, no subjective hyperthermia, no subjective hypothermia Eyes: no blurry vision, no xerophthalmia ENT: no sore throat, + see HPI Cardiovascular: no CP/no SOB/no palpitations/no leg swelling Respiratory: no cough/no SOB/no wheezing Gastrointestinal: no N/no V/no D/no C/no acid reflux Musculoskeletal: no muscle aches/no joint aches Skin: no rashes, no hair loss Neurological: no tremors/no numbness/no tingling/no dizziness  I reviewed pt's medications, allergies, PMH, social hx, family hx, and changes were documented in the history of present illness. Otherwise, unchanged from my initial visit note.  Past Medical History:  Diagnosis Date   Allergy    G E R D 02/22/2007   Genital warts    Hemorrhoids, internal 01/31/2011   Hypothyroidism    Migraine    Past Surgical History:  Procedure Laterality Date   APPENDECTOMY     DILATION AND CURETTAGE OF UTERUS     miscarriage     x3--required d and c   Social History   Socioeconomic History   Marital status: Married    Spouse name: Not on file   Number of children: Not on file   Years of education: Not on file   Highest education level: Not on file  Occupational History   Not on file  Tobacco Use   Smoking status: Former    Pack years: 0.00    Types: Cigarettes    Quit date: 01/31/1996    Years since quitting: 24.8   Smokeless tobacco: Never  Vaping Use   Vaping Use: Never used  Substance and Sexual Activity   Alcohol use: Yes    Alcohol/week: 0.0 standard drinks    Comment: occ   Drug use: No   Sexual activity: Yes    Partners: Male  Other Topics Concern   Not on file  Social History Narrative   Not on file   Social Determinants of Health   Financial Resource Strain: Not on file  Food Insecurity: Not on file  Transportation Needs: Not on file  Physical Activity: Not on file   Stress: Not on file  Social Connections: Not on file  Intimate Partner Violence: Not on file   Current Outpatient Medications on File Prior to Visit  Medication Sig Dispense Refill   ARMOUR THYROID 60 MG tablet Take 1 tablet (60 mg total) by mouth daily before breakfast. 30 tablet 0   ASHWAGANDHA PO Take by mouth.     aspirin EC 81 MG tablet Take 81 mg daily by mouth.     CALCIUM PO Take 1 tablet daily with lunch by mouth.     Cholecalciferol (VITAMIN D PO)  Take 500 Units daily by mouth.     Cyanocobalamin (VITAMIN B-12 PO) Take 1 tablet daily with lunch by mouth.     cyclobenzaprine (FLEXERIL) 10 MG tablet Take 1 tablet (10 mg total) by mouth 3 (three) times daily as needed for muscle spasms. 30 tablet 0   Fexofenadine HCl (ALLEGRA PO) Take 1 tablet daily as needed by mouth (seasonal allergies).     fluticasone (FLONASE) 50 MCG/ACT nasal spray Place 2 sprays into both nostrils daily. 16 g 0   loratadine (CLARITIN) 10 MG tablet Take 10 mg by mouth daily.     MAGNESIUM PO Take by mouth.     Multiple Vitamin (MULTIVITAMIN WITH MINERALS) TABS tablet Take 1 tablet daily with lunch by mouth.     oxaprozin (DAYPRO) 600 MG tablet TAKE 1 TABLET (600 MG TOTAL) BY MOUTH IN THE MORNING AND AT BEDTIME. 60 tablet 3   Pyridoxine HCl (VITAMIN B-6 PO) Take 1 tablet daily with lunch by mouth.     No current facility-administered medications on file prior to visit.   No Known Allergies Family History  Problem Relation Age of Onset   Stroke Mother    Arthritis Mother    Hypertension Father    Arthritis Father    Hyperlipidemia Father    Cancer Maternal Aunt        ovarian   Arthritis Maternal Grandmother    Arthritis Maternal Grandfather    Arthritis Paternal Grandmother    Arthritis Paternal Grandfather    Cancer Maternal Aunt        Breast   Cancer Maternal Aunt        Lung   Diabetes Neg Hx        family   Stomach cancer Neg Hx    Colon cancer Neg Hx    Pancreatic cancer Neg Hx     Esophageal cancer Neg Hx    Rectal cancer Neg Hx     PE: BP 128/82 (BP Location: Right Arm, Patient Position: Sitting, Cuff Size: Normal)   Pulse 94   Ht 5\' 9"  (1.753 m)   Wt 182 lb (82.6 kg)   LMP 03/11/2013   SpO2 97%   BMI 26.88 kg/m  Wt Readings from Last 3 Encounters:  12/14/20 182 lb (82.6 kg)  09/17/20 205 lb (93 kg)  08/10/20 200 lb (90.7 kg)   Constitutional: overweight, in NAD Eyes: PERRLA, EOMI, no exophthalmos ENT: moist mucous membranes, no thyromegaly, no cervical lymphadenopathy Cardiovascular: tachycardia, RR, No MRG Respiratory: CTA B Gastrointestinal: abdomen soft, NT, ND, BS+ Musculoskeletal: no deformities, strength intact in all 4 Skin: moist, warm, no rashes Neurological: no tremor with outstretched hands, DTR normal in all 4  ASSESSMENT: 1. Hypothyroidism -Due to Hashimoto's thyroiditis  2.  Vitamin D insufficiency  3.  History of low B12 vitamin  PLAN:  1. Patient with longstanding Hashimoto's hypothyroidism, previously on levothyroxine and liothyronine, currently on Armour.  Before switching to Armour, she had several complaints possibly related to hypothyroidism: Fatigue, constipation, cold intolerance.  We initially started 60 mg of Armour daily, however, we had to increase the dose to 90 mg daily 09/2018. - latest thyroid labs reviewed with pt. >> TSH was low, but at that time she was on biotin, which she forgot to stop before labs: Lab Results  Component Value Date   TSH 0.11 (L) 09/17/2020  - she continues on Armour 90 mg daily and selenium 200 mcg daily - pt feels great on this dose.  However,  at today's visit, she has a 17 pound weight loss since last visit and also tachycardia.  Despite her being on a keto diet, this may be indicative of overtreatment with Armour.  We discussed that if we need to decrease the dose, to decrease it slowly to a combination of 90 and 60 mg doses. - we discussed about taking the thyroid hormone every day, with  water, >30 minutes before breakfast, separated by >4 hours from acid reflux medications, calcium, iron, multivitamins. Pt. is taking it correctly. - will check thyroid tests today: TSH, fT3 and fT4 - If labs are abnormal, she will need to return for repeat TFTs in 1.5 months  2.   Vitamin D insufficiency -She is on ~4000 units vitamin D daily (10,000 units 3x a week), decreased from last OV when she was 7500 units daily -Latest level was normal at last visit: Lab Results  Component Value Date   VD25OH 60.9 12/15/2019  -We will repeat the level today  3.  History of low B12 vitamin -At last visit, she was taking liquid vitamin B12 at 10,000 units daily. -Vitamin B-12 level was high and she also had some dizziness so we discussed about reducing the dose to 5000 units daily. Dizziness resolved -We will recheck the level today  Needs refills.  Component     Latest Ref Rng & Units 12/14/2020  TSH     0.35 - 4.50 uIU/mL 0.26 (L)  T4,Free(Direct)     0.60 - 1.60 ng/dL 5.78  Triiodothyronine,Free,Serum     2.3 - 4.2 pg/mL 4.7 (H)  Vitamin B12     211 - 911 pg/mL 350  Thyroglobulin Ab     < or = 1 IU/mL <1  Thyroperoxidase Ab SerPl-aCnc     <9 IU/mL 481 (H)  Vitamin D, 25-Hydroxy     30.0 - 100.0 ng/mL 47.5   TSH slightly low >> we will try to decrease the Armour dose to 90 mg 5/7 days and 60 mg 2/7 days and recheck her TFTs again in 5 weeks. Vitamin D and B12  =  normal. TPO antibodies are still high, but improved.   Carlus Pavlov, MD PhD [facility] Endocrinology

## 2020-12-14 NOTE — Patient Instructions (Addendum)
Please continue Armour 90 mg daily.  Take the thyroid hormone every day, with water, at least 30 minutes before breakfast, separated by at least 4 hours from: - acid reflux medications - calcium - iron - multivitamins  Please stop at the lab.  Continue: - B12 5000 mcg daily - vitamin D 4000 units daily  Please come for another appointment in 1 year

## 2020-12-15 LAB — VITAMIN D 25 HYDROXY (VIT D DEFICIENCY, FRACTURES): Vit D, 25-Hydroxy: 47.5 ng/mL (ref 30.0–100.0)

## 2020-12-17 ENCOUNTER — Encounter: Payer: Self-pay | Admitting: Internal Medicine

## 2020-12-17 LAB — THYROID PEROXIDASE ANTIBODY: Thyroperoxidase Ab SerPl-aCnc: 481 IU/mL — ABNORMAL HIGH (ref <9)

## 2020-12-17 LAB — THYROGLOBULIN ANTIBODY: Thyroglobulin Ab: <1 IU/mL (ref <=1)

## 2020-12-20 ENCOUNTER — Other Ambulatory Visit: Payer: Self-pay

## 2020-12-20 DIAGNOSIS — E063 Autoimmune thyroiditis: Secondary | ICD-10-CM

## 2020-12-20 MED ORDER — ARMOUR THYROID 60 MG PO TABS: 60.0000 mg | ORAL_TABLET | Freq: Every day | ORAL | 3 refills | Status: DC

## 2021-03-05 ENCOUNTER — Other Ambulatory Visit: Payer: Self-pay

## 2021-03-05 ENCOUNTER — Ambulatory Visit (INDEPENDENT_AMBULATORY_CARE_PROVIDER_SITE_OTHER): Payer: 59 | Admitting: Podiatry

## 2021-03-05 DIAGNOSIS — M722 Plantar fascial fibromatosis: Secondary | ICD-10-CM

## 2021-03-05 DIAGNOSIS — M2170 Unequal limb length (acquired), unspecified site: Secondary | ICD-10-CM | POA: Diagnosis not present

## 2021-03-05 NOTE — Progress Notes (Signed)
She presents today states that she notices that she still has some tenderness in the heels and in the foot she would like to consider some new orthotics feels like the other ones are getting worn out just a little bit.  Objective: Vital signs are stable she alert oriented x3 she has some tenderness on palpation medial calcaneal tubercle and on range of motion of the midfoot and rear foot.  Short right leg by just under quarter of an inch  Assessment: Capsulitis tendinitis plan fasciitis right.  Short right leg  Plan: At this point I ordered her new sets of orthotics and I will follow-up with her once those come in.

## 2021-03-20 ENCOUNTER — Other Ambulatory Visit: Payer: Self-pay | Admitting: Internal Medicine

## 2021-03-21 ENCOUNTER — Telehealth: Payer: Self-pay | Admitting: Podiatry

## 2021-03-21 NOTE — Telephone Encounter (Signed)
Orthotics in... lvm for pt ok to pick up... 

## 2021-04-20 ENCOUNTER — Other Ambulatory Visit: Payer: Self-pay | Admitting: Internal Medicine

## 2021-04-20 DIAGNOSIS — E038 Other specified hypothyroidism: Secondary | ICD-10-CM

## 2021-05-15 ENCOUNTER — Other Ambulatory Visit: Payer: Self-pay | Admitting: Internal Medicine

## 2021-05-15 DIAGNOSIS — E063 Autoimmune thyroiditis: Secondary | ICD-10-CM

## 2021-05-15 DIAGNOSIS — E038 Other specified hypothyroidism: Secondary | ICD-10-CM

## 2021-05-16 ENCOUNTER — Other Ambulatory Visit: Payer: 59

## 2021-06-19 ENCOUNTER — Other Ambulatory Visit (INDEPENDENT_AMBULATORY_CARE_PROVIDER_SITE_OTHER): Payer: 59

## 2021-06-19 ENCOUNTER — Other Ambulatory Visit: Payer: Self-pay

## 2021-06-19 DIAGNOSIS — E063 Autoimmune thyroiditis: Secondary | ICD-10-CM | POA: Diagnosis not present

## 2021-06-19 DIAGNOSIS — E038 Other specified hypothyroidism: Secondary | ICD-10-CM

## 2021-06-19 LAB — T3, FREE: T3, Free: 4.1 pg/mL (ref 2.3–4.2)

## 2021-06-19 LAB — TSH: TSH: 2.34 u[IU]/mL (ref 0.35–5.50)

## 2021-06-19 LAB — T4, FREE: Free T4: 0.63 ng/dL (ref 0.60–1.60)

## 2021-07-19 ENCOUNTER — Other Ambulatory Visit: Payer: Self-pay | Admitting: Orthopedic Surgery

## 2021-07-19 DIAGNOSIS — M25539 Pain in unspecified wrist: Secondary | ICD-10-CM

## 2021-07-19 DIAGNOSIS — G8929 Other chronic pain: Secondary | ICD-10-CM

## 2021-08-05 ENCOUNTER — Ambulatory Visit
Admission: RE | Admit: 2021-08-05 | Discharge: 2021-08-05 | Disposition: A | Discharge status: Home / Self Care | Payer: 59 | Source: Ambulatory Visit | Attending: Orthopedic Surgery | Admitting: Orthopedic Surgery

## 2021-08-05 ENCOUNTER — Other Ambulatory Visit: Payer: Self-pay

## 2021-08-05 DIAGNOSIS — M25539 Pain in unspecified wrist: Secondary | ICD-10-CM

## 2021-08-05 DIAGNOSIS — G8929 Other chronic pain: Secondary | ICD-10-CM

## 2021-08-05 IMAGING — XA DG FLUORO GUIDE NDL PLC/BX
1 series · 1 of 1 positions shown · non-contrast
Comparison: none

CLINICAL DATA: Pain.  No inciting event or previous surgery.

EXAM:
EXAM
RIGHT WRIST INJECTION UNDER FLUOROSCOPY FOR MRI
FLUOROSCOPY TIME:  Radiation Exposure Index (as provided by the
fluoroscopic device): 0.5 mGy Kerma
TECHNIQUE: The procedure, risks (including but not limited to bleeding,
infection, organ damage ), benefits, and alternatives were explained
to the patient. Questions regarding the procedure were encouraged
and answered. The patient understands and consents to the procedure.

[Series 1: ortho standard · 1 of 1 slices shown]
[im 1/1]
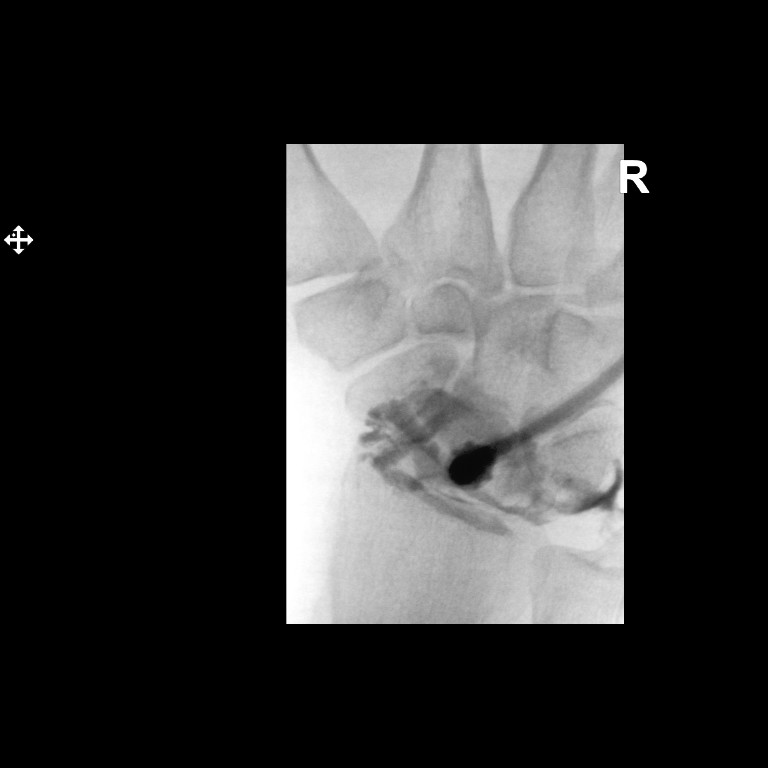

[1 of 1 positions shown; findings below may reference images not displayed]

An appropriate skin entry site was determined under fluoroscopy.
Skin site was marked, prepped with Betadine, and draped in usual
sterile fashion, and infiltrated locally with 1% lidocaine.

25-gauge spinal needle advanced into the radiocarpal joint under
intermittent fluoroscopy. 4ml of a mixture of 20 mL dilute iodinated
contrast with 0.1 mL Multihance contrast was injected into the
radiocarpal joint. Intraarticular flow was confirmed on fluoroscopy.
Patient transferred to MRI.

COMPLICATIONS:
COMPLICATIONS
none
IMPRESSION: 1. Technically successful right wrist injection for MRI

## 2021-08-05 IMAGING — MR MR WRIST*R* W/ CM
4 of 5 series · 27 of 40 positions shown · IV contrast (agent unspecified)
Comparison: Right wrist x-rays dated [DATE].

CONTRAST:  See injection documentation.

CLINICAL DATA: Chronic right wrist pain, weakness, and numbness for
the past year. No injury or prior surgery.

EXAM:
MR OF THE RIGHT WRIST WITH CONTRAST (MR ARTHROGRAM)
TECHNIQUE: Multiplanar, multisequence MR imaging of the right wrist was
performed following the administration of intra-articular contrast.

[Series 3: T1 fat-sat · axial · 3.0mm · 0.20mm/px · z∈[-70,-16]mm · 7 of 21 slices shown (1 of 2)]
[im 1/21]
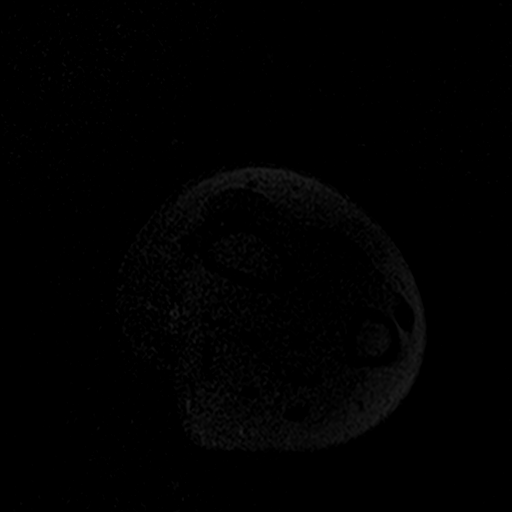
[im 3/21]
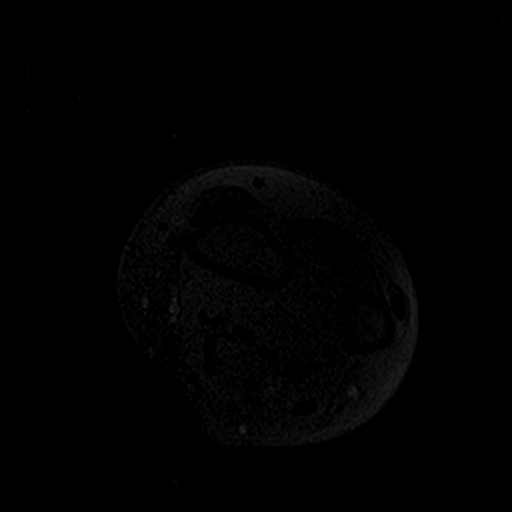
[im 6/21]
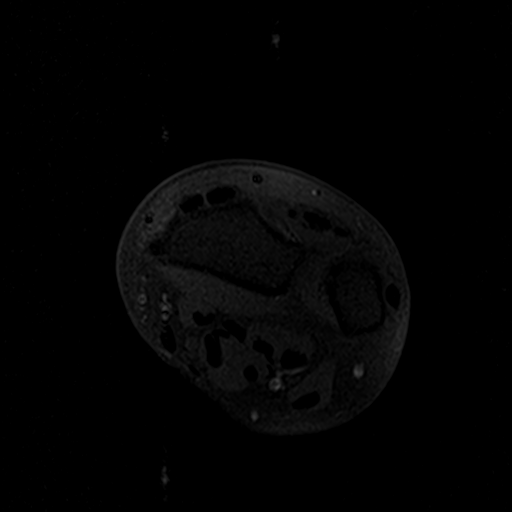
[im 8/21]
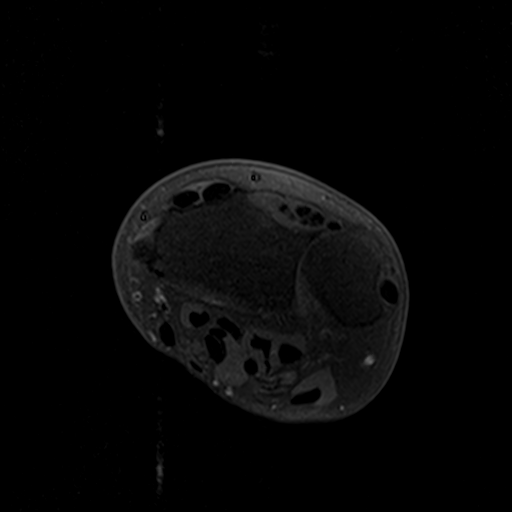
[im 11/21]
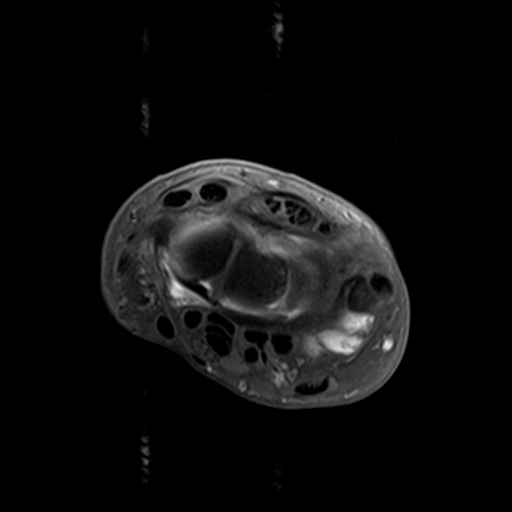
[im 13/21]
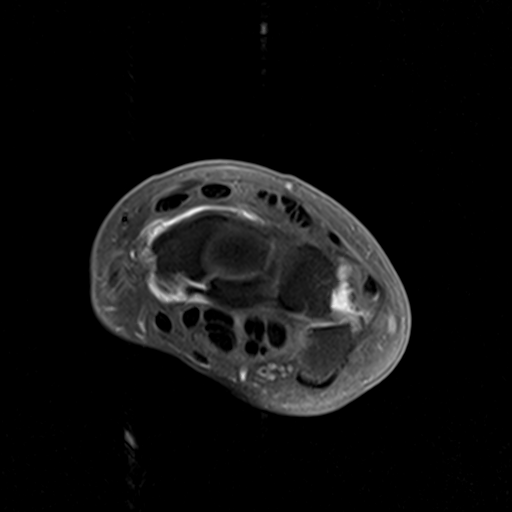
[im 18/21]
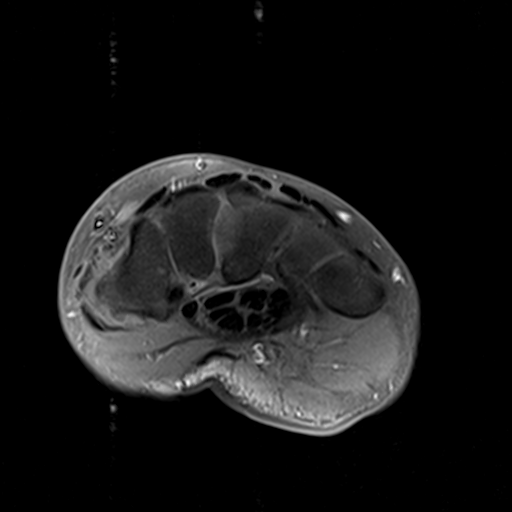

[Series 4: T2 fat-sat · axial · 3.0mm · 0.20mm/px · z∈[-69,-6]mm · 9 of 21 slices shown (1 of 2)]
[im 1/21]
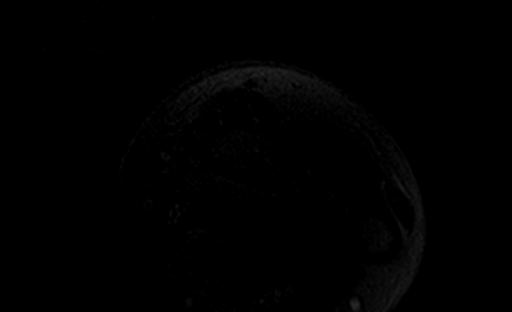
[im 3/21]
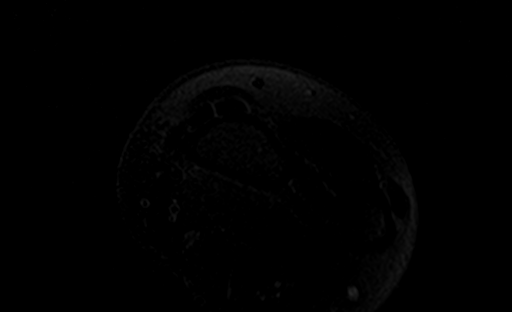
[im 6/21]
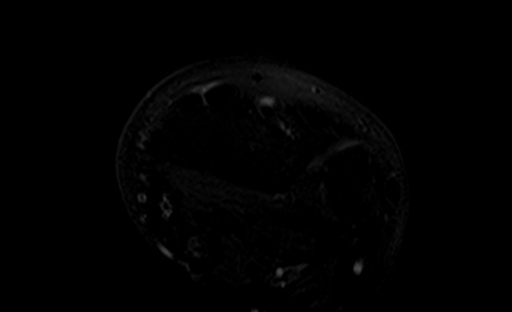
[im 8/21]
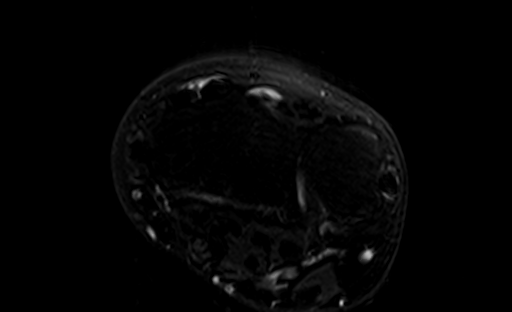
[im 11/21]
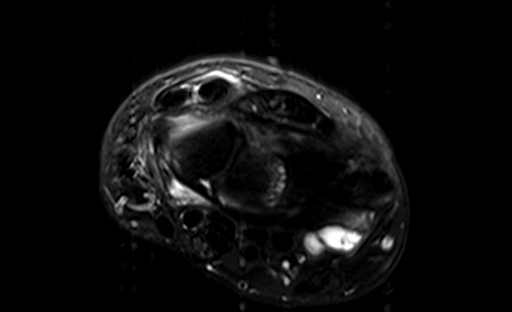
[im 13/21]
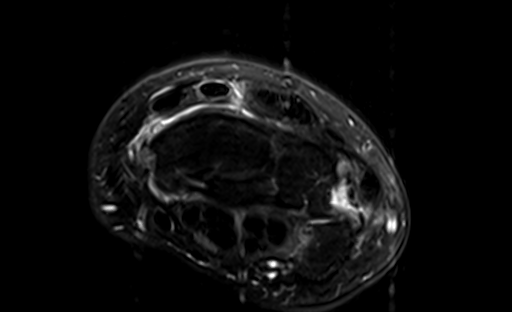
[im 16/21]
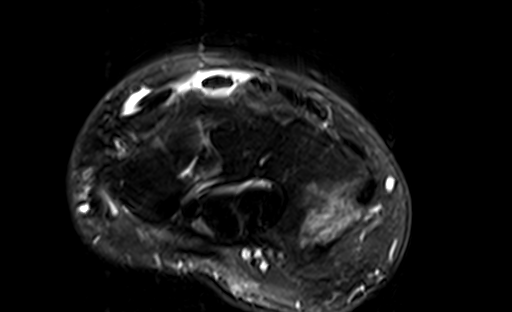
[im 18/21]
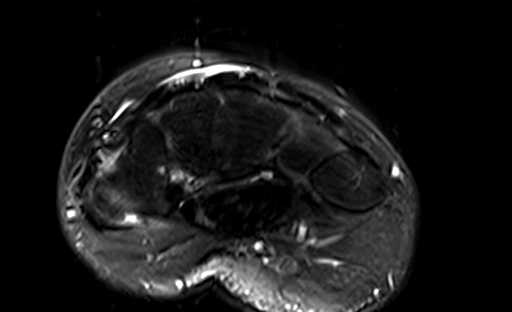
[im 21/21]
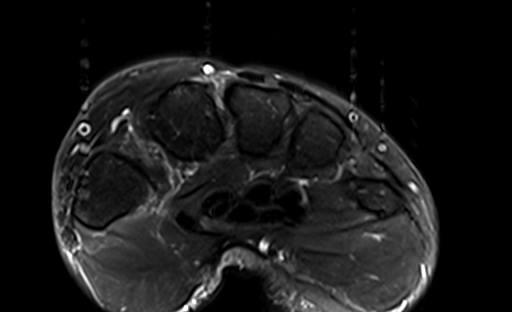

[Series 5: T1 fat-sat · coronal · 3.0mm · 0.39mm/px · 3 of 15 slices shown (2 of 2)]
[im 3/15]
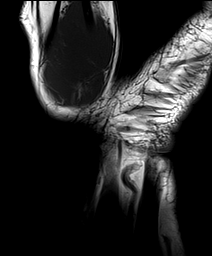
[im 8/15]
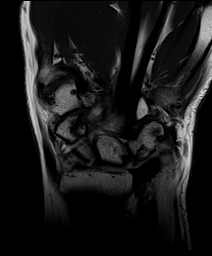
[im 12/15]
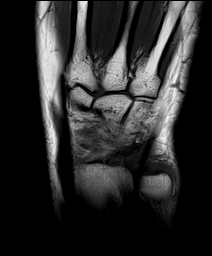

[Series 7: T2 fat-sat · sagittal · 3.0mm · 0.20mm/px · 8 of 19 slices shown (2 of 2)]
[im 1/19]
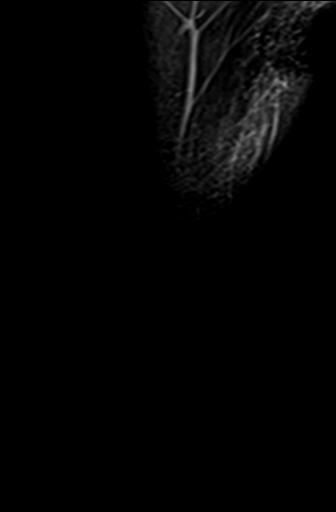
[im 3/19]
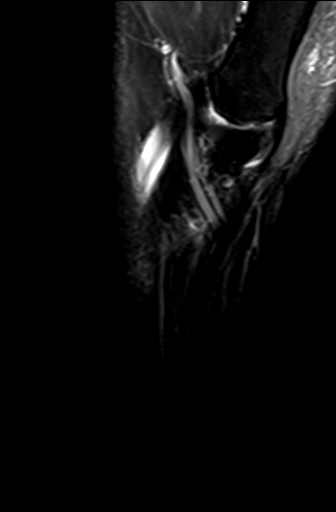
[im 6/19]
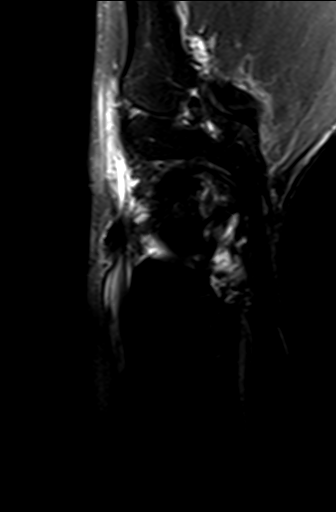
[im 8/19]
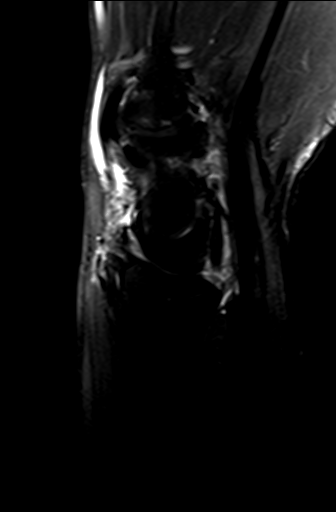
[im 11/19]
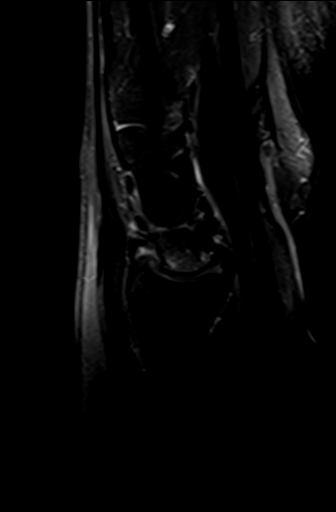
[im 13/19]
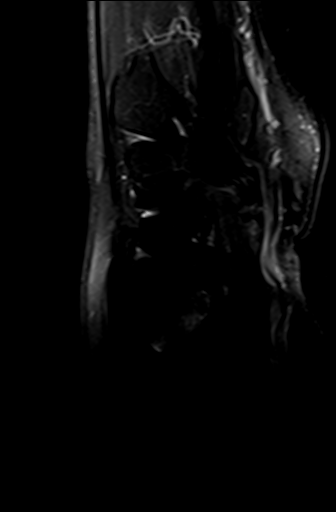
[im 16/19]
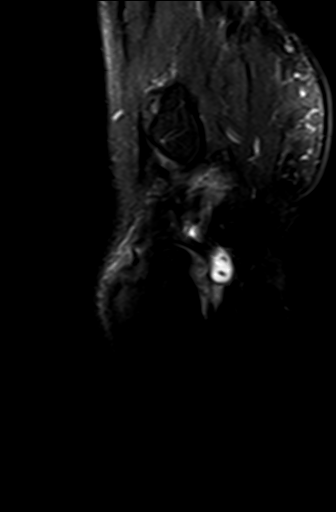
[im 19/19]
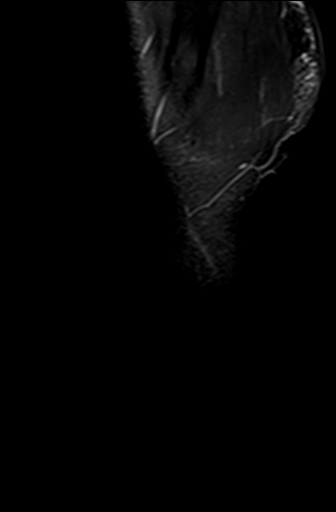

[27 of 40 positions shown; findings below may reference images not displayed]

FINDINGS: Ligaments: Intact scapholunate and lunotriquetral ligaments.

Triangular fibrocartilage: Intact TFCC. Degenerative signal within
the articular disc.

Tendons: Intact flexor and extensor compartment tendons. Small
amount of fluid surrounding the extensor carpi radialis brevis and
extensor pollicis longus tendons at their intersection.

Carpal tunnel/median nerve: Normal carpal tunnel. Normal median
nerve.

Guyon's canal: Normal.

Joint/cartilage: No joint effusion. Mild radiocarpal,
scaphotrapeziotrapezoid, and first CMC joint degenerative changes.
Small subchondral cyst in the ulnar aspect of the lunate.

Bones/carpal alignment: No acute fracture or dislocation. No
suspicious bone lesion.

Other: No fluid collection or hematoma.
IMPRESSION: 1. Mild tenosynovitis of the extensor carpi radialis brevis and
extensor pollicis longus tendons at their intersection, suggestive
of distal intersection syndrome.
2. Mild radiocarpal, scaphotrapeziotrapezoid, and first CMC joint
osteoarthritis.
3. TFCC degeneration without tear. Small subchondral cyst in the
ulnar aspect of the lunate. Findings suspicious for ulnar impaction
syndrome.

## 2021-08-05 MED ORDER — IOPAMIDOL (ISOVUE-M 200) INJECTION 41%
2.0000 mL | Freq: Once | INTRAMUSCULAR | Status: AC
  Administered 2021-08-05: 2 mL via INTRA_ARTICULAR

## 2021-08-06 IMAGING — MR MR WRIST*R* W/ CM
1 series · 15 of 15 positions shown · IV contrast (agent unspecified)
Comparison: Right wrist x-rays dated [DATE].

CONTRAST:  See injection documentation.

CLINICAL DATA: Chronic right wrist pain, weakness, and numbness for
the past year. No injury or prior surgery.

EXAM:
MR OF THE RIGHT WRIST WITH CONTRAST (MR ARTHROGRAM)
TECHNIQUE: Multiplanar, multisequence MR imaging of the right wrist was
performed following the administration of intra-articular contrast.

[Series 4: T2 fat-sat · coronal · left · 3.0mm · 0.31mm/px · 15 of 15 slices shown]
[im 1/15]
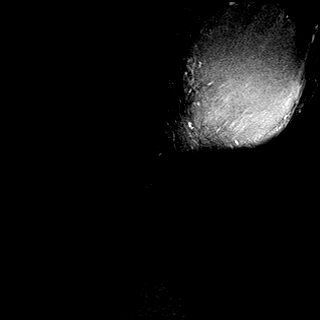
[im 2/15]
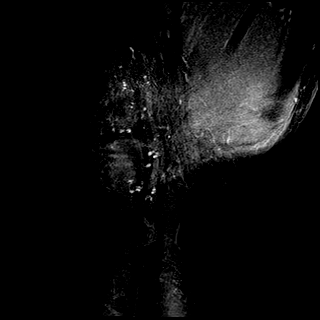
[im 3/15]
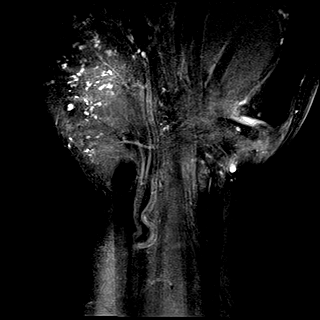
[im 4/15]
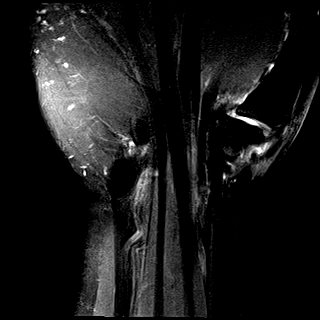
[im 5/15]
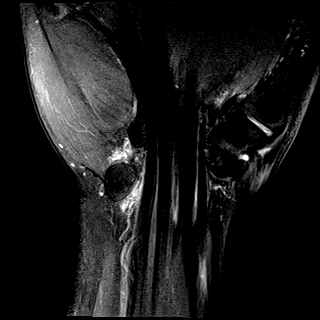
[im 6/15]
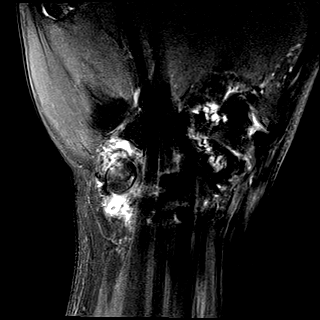
[im 7/15]
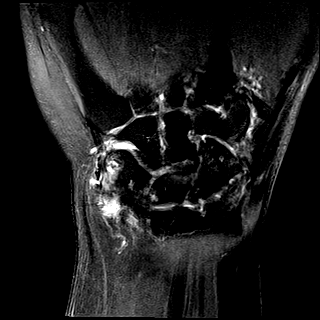
[im 8/15]
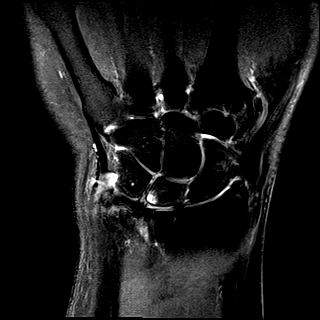
[im 9/15]
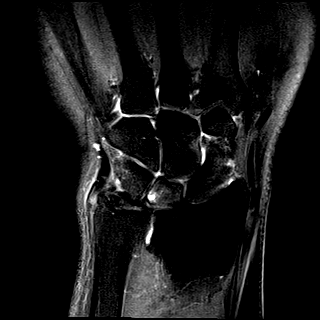
[im 10/15]
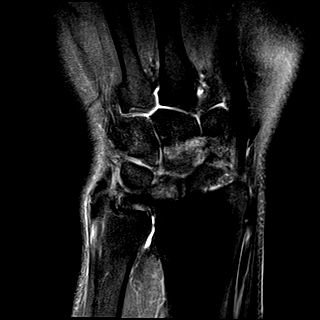
[im 11/15]
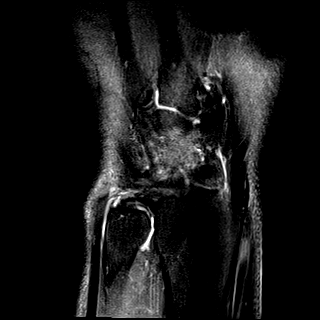
[im 12/15]
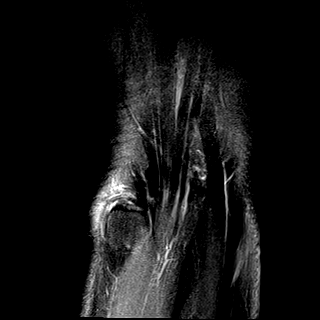
[im 13/15]
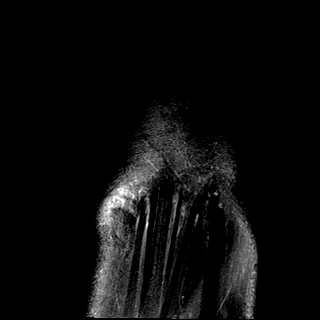
[im 14/15]
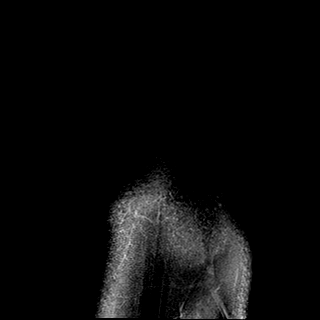
[im 15/15]
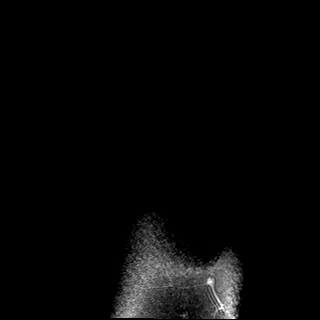

[15 of 15 positions shown; findings below may reference images not displayed]

FINDINGS: Ligaments: Intact scapholunate and lunotriquetral ligaments.

Triangular fibrocartilage: Intact TFCC. Degenerative signal within
the articular disc.

Tendons: Intact flexor and extensor compartment tendons. Small
amount of fluid surrounding the extensor carpi radialis brevis and
extensor pollicis longus tendons at their intersection.

Carpal tunnel/median nerve: Normal carpal tunnel. Normal median
nerve.

Guyon's canal: Normal.

Joint/cartilage: No joint effusion. Mild radiocarpal,
scaphotrapeziotrapezoid, and first CMC joint degenerative changes.
Small subchondral cyst in the ulnar aspect of the lunate.

Bones/carpal alignment: No acute fracture or dislocation. No
suspicious bone lesion.

Other: No fluid collection or hematoma.
IMPRESSION: 1. Mild tenosynovitis of the extensor carpi radialis brevis and
extensor pollicis longus tendons at their intersection, suggestive
of distal intersection syndrome.
2. Mild radiocarpal, scaphotrapeziotrapezoid, and first CMC joint
osteoarthritis.
3. TFCC degeneration without tear. Small subchondral cyst in the
ulnar aspect of the lunate. Findings suspicious for ulnar impaction
syndrome.

## 2021-10-07 ENCOUNTER — Other Ambulatory Visit: Payer: Self-pay | Admitting: Internal Medicine

## 2021-11-13 ENCOUNTER — Telehealth: Payer: Self-pay

## 2021-11-13 NOTE — Telephone Encounter (Signed)
I spoke with pt and she said her plans are to go to [facility] off [address] when pt gets off work at 5 PM. UC & ED precautions given and pt voiced understanding. Sending note to Hayden Pedro FNP who pt has appt with to [facility] 01/02/2022 and [facility].  ?

## 2021-11-13 NOTE — Telephone Encounter (Signed)
[facility] - Client ?TELEPHONE ADVICE RECORD ?[facility]? ?Patient ?Name: ?Kelly P ?Tran ?Gender: Female ?DOB: [birth year] ?Age: 56 Y 7 D ?Return ?Phone ?Number: ?[id number] ?(Primary) ?Address: ?City/ ?State/ ?Zip: [place] ? [address] ?Client [facility] - Client ?Client Site [place] - Day ?Provider AA - PHYSICIAN, NOT LISTED- MD ?Contact Type Call ?Who Is Calling Patient / Member / Family / Caregiver ?Call Type Triage / Clinical ?Relationship To Patient Self ?Return Phone Number [phone] (Primary) ?Chief Complaint Dehydration (greater than 41 year old) ?Reason for Call Symptomatic / Request for Health Information ?Initial Comment Caller states she has been experiencing nausea ?and fatigue since overheating out in the garden ?on Sunday (11/10/21). She is scheduled for an appt ?with Tabitha on 01/02/22. She is concerned of ?dehydration. Caller was transferred by the office. ?[facility] Listed [facility] next to [facility] ?Translation No ?Nurse Assessment ?Nurse: Velta Addison, RN, Crystal Date/Time (Eastern Time): 11/13/2021 1:23:42 PM ?Confirm and document reason for call. If ?symptomatic, describe symptoms. ?---Caller states she has been experiencing slight nausea ?and (no energy) fatigue since overheating out in the ?garden on Sunday (11/10/21). She is scheduled for an ?appt with Tabitha on 01/02/22. She is concerned of ?dehydration. Caller was transferred by the office. ?Generally does not feel well. ?Does the patient have any new or worsening ?symptoms? ---Yes ?Will a triage be completed? ---Yes ?Related visit to physician within the last 2 weeks? ---No ?Does the PT have any chronic conditions? (i.e. ?diabetes, asthma, this includes High risk factors for ?pregnancy, etc.) ?---Yes ?List chronic conditions. ---hoshimotos; thyroid ?Is this a behavioral health or substance abuse call? ---No ?Guidelines ?Guideline Title Affirmed Question  Affirmed Notes Nurse Date/Time (Eastern ?Time) ?Heat Exposure (Heat ?Exhaustion and Heat ?Stroke) ?[1] Dizziness or ?weakness AND [2] ?persists after 2 hours ?Parrott, RN, [place] 11/13/2021 1:25:35 ?PM ?PLEASE NOTE: All timestamps contained within this report are represented as [place] Standard Time. ?CONFIDENTIALTY NOTICE: This fax transmission is intended only for the addressee. It contains information that is legally privileged, confidential or ?otherwise protected from use or disclosure. If you are not the intended recipient, you are strictly prohibited from reviewing, disclosing, copying using ?or disseminating any of this information or taking any action in reliance on or regarding this information. If you have received this fax in error, please ?notify us immediately by telephone so that we can arrange for its return to [place]. Phone: [phone], Toll-Free: [phone], Fax: [phone] ?Page: 2 of 2 ?Call Id: [id number] ?Guidelines ?Guideline Title Affirmed Question Affirmed Notes Nurse Date/Time (Eastern ?Time) ?of rest and drinking ?liquids ?Disp. Time (Eastern ?Time) Disposition Final User ?11/13/2021 1:29:36 PM Go to ED Now (or PCP triage) Yes Parrott, RN, Crystal ?Caller Disagree/Comply Disagree ?Caller Understands Yes ?PreDisposition Call Doctor ?Care Advice Given Per Guideline ?GO TO ED NOW (OR PCP TRIAGE): * IF NO PCP (PRIMARY CARE PROVIDER) SECOND-LEVEL TRIAGE: You need to be ?seen within the next hour. Go to the [facility] at _____________ [place] as soon as you can. ANOTHER ADULT SHOULD ?DRIVE: * It is better and safer if another adult drives instead of you. CARE ADVICE given per Heat Exposure (Adult) guideline. ?Comments ?User: Hamilton Capri, RN Date/Time Eilene Ghazi Time): 11/13/2021 1:26:05 PM ?urine is cloudy today, no burning stinging. ?User: Hamilton Capri, RN Date/Time Eilene Ghazi Time): 11/13/2021 1:31:49 PM ?Caller states she is going to go after work. States she feels if her symptoms get  worse she will go sooner. States ?  her symptoms were worse on Sunday, are milder but are persisting. States she is drinking fluids, urinating like ?normal but did have the change in her color of urine this morning to cloudy with no other symptoms (pain/flank, ?abd pain, fever, burning, etc) ?Referrals ?GO TO FACILITY OTHER - SPECIF ?

## 2021-11-14 NOTE — Telephone Encounter (Signed)
Agree with precautions given to pt  Agree with nurse assessment in plan.  Thank you for speaking with them. 

## 2021-12-10 ENCOUNTER — Encounter: Payer: Self-pay | Admitting: Internal Medicine

## 2021-12-10 ENCOUNTER — Ambulatory Visit (INDEPENDENT_AMBULATORY_CARE_PROVIDER_SITE_OTHER): Payer: 59 | Admitting: Internal Medicine

## 2021-12-10 VITALS — BP 122/74 | HR 65 | Ht 69.0 in | Wt 197.8 lb

## 2021-12-10 DIAGNOSIS — E538 Deficiency of other specified B group vitamins: Secondary | ICD-10-CM | POA: Diagnosis not present

## 2021-12-10 DIAGNOSIS — E559 Vitamin D deficiency, unspecified: Secondary | ICD-10-CM | POA: Diagnosis not present

## 2021-12-10 DIAGNOSIS — E063 Autoimmune thyroiditis: Secondary | ICD-10-CM | POA: Diagnosis not present

## 2021-12-10 DIAGNOSIS — E038 Other specified hypothyroidism: Secondary | ICD-10-CM

## 2021-12-10 LAB — TSH: TSH: 2.51 u[IU]/mL (ref 0.35–5.50)

## 2021-12-10 LAB — T3, FREE: T3, Free: 4 pg/mL (ref 2.3–4.2)

## 2021-12-10 LAB — T4, FREE: Free T4: 0.68 ng/dL (ref 0.60–1.60)

## 2021-12-10 LAB — VITAMIN D 25 HYDROXY (VIT D DEFICIENCY, FRACTURES): VITD: 54.2 ng/mL (ref 30.00–100.00)

## 2021-12-10 LAB — VITAMIN B12: Vitamin B-12: 1134 pg/mL — ABNORMAL HIGH (ref 211–911)

## 2021-12-10 MED ORDER — ARMOUR THYROID 60 MG PO TABS: 60.0000 mg | ORAL_TABLET | Freq: Every day | ORAL | 3 refills | Status: DC

## 2021-12-10 MED ORDER — ARMOUR THYROID 90 MG PO TABS
90.0000 mg | ORAL_TABLET | Freq: Every day | ORAL | 3 refills | Status: DC
Start: 2021-12-10 — End: 2023-01-02

## 2021-12-10 NOTE — Progress Notes (Signed)
Patient ID: Kelly Tran, female   DOB: [birth year], 56 y.o.   MRN: [id number]    HPI  Kelly Tran is a 56 y.o.-year-old female, initially referred by her PCP, Kelly Munroe, Kelly Tran, returning for follow-up for Hashimoto's hypothyroidism.  He previously saw Dr. Lucianne Tran, last visit 01/2018.  I first saw the patient in 07/2018.  Last visit with me 1 year ago.  Interim history: Previous to last visit, she was on a keto diet and lost 20 pounds in 2 to 3 months.  She relaxed her diet right before the last visit and she started to feel better. She gained 15 lbs since last OV. She has acid reflux - drinks water + baking soda >> works well for her.  Reviewed history: Pt. has been dx with hypothyroidism in 11/2011.  She was initially started on levothyroxine, but she did not tolerate this well.  She then was changed to Armour in 07/2012 and 45 mg daily, then increase to 60 mg daily in 06/2015.  However, she developed palpitations (stopped after stopping amitriptyline).  Her medication was changed to levothyroxine 50 mcg + liothyronine 10 mg daily in 02/2017.  This was increased to 8 tablets of levothyroxine and 14 tablets of liothyronine a week in 05/2018.  At that time, TSH was 3.77.  However, she was complaining of fatigue, cold intolerance, hair loss, and constipation and we switched to Armour thyroid.  Her symptoms improved on this, with the exception of hair loss.  She is on Armour 90 mg 5/7 days and 60 mg 2/7 days, dose decreased at last visit.  She takes this: - in am - fasting - at least 30 min from b'fast - no iron, PPIs - + calcium, multivitamins - at night - + Biotin 10,000 mcg daily - off for 2 months On Ashwaghanda, Mg - for sleep.  Reviewed her TFTs: Lab Results  Component Value Date   TSH 2.34 06/19/2021   TSH 0.26 (L) 12/14/2020   TSH 0.11 (L) 09/17/2020   TSH 0.59 12/15/2019   TSH 0.27 (L) 06/20/2019   TSH 1.26 11/10/2018   TSH 6.91 (H) 09/21/2018   TSH 7.30 (H) 08/06/2018    TSH 0.84 06/22/2018   TSH 3.77 05/21/2018   FREET4 0.63 06/19/2021   FREET4 0.82 12/14/2020   FREET4 0.66 09/17/2020   FREET4 0.69 12/15/2019   FREET4 0.80 06/20/2019   FREET4 0.56 (L) 11/10/2018   FREET4 0.57 (L) 09/21/2018   FREET4 0.58 (L) 08/06/2018   FREET4 0.77 06/22/2018   FREET4 0.69 05/21/2018   T3FREE 4.1 06/19/2021   T3FREE 4.7 (H) 12/14/2020   T3FREE 4.4 (H) 12/15/2019   T3FREE 4.7 (H) 06/20/2019   T3FREE 2.8 11/10/2018   T3FREE 3.8 09/21/2018   T3FREE 3.3 08/06/2018   T3FREE 3.7 06/22/2018   T3FREE 3.3 01/12/2018   T3FREE 4.1 06/25/2017   Her TPO antibodies are elevated: Component     Latest Ref Rng & Units 12/14/2020  Thyroglobulin Ab     < or = 1 IU/mL <1  Thyroperoxidase Ab SerPl-aCnc     <9 IU/mL 481 (H)   Component     Latest Ref Rng & Units 06/22/2018 11/10/2018  Thyroglobulin Ab     < or = 1 IU/mL <1   Thyroperoxidase Ab SerPl-aCnc     <9 IU/mL 822 (H) 770 (H)   We started selenium 200 mcg daily in 07/2018.  She also started to improve her diet to reduce gluten, nuts, dairy.  Pt denies: - feeling nodules in neck - hoarseness - dysphagia - choking  She has no FH of thyroid disorders.No FH of thyroid cancer. No h/o radiation tx to head or neck. No herbal supplements. No recent steroids use.   Pt. also has a history of GERD, multiple pregnancy losses, low vitamin B-12 (on liquid b12  -dose decreased from 10,000 to 5000 units daily in 12/2019) and D (on vitamin D 4000 units daily), and also low testosterone.  Lab Results  Component Value Date   VITAMINB12 350 12/14/2020   VITAMINB12 >1526 (H) 12/15/2019   VITAMINB12 357 05/21/2018   VITAMINB12 220 02/02/2018   VITAMINB12 392 07/15/2016   VITAMINB12 452 06/28/2015   VITAMINB12 295 08/08/2008   Lab Results  Component Value Date   VD25OH 47.5 12/14/2020   VD25OH 60.9 12/15/2019   VD25OH 32.55 05/21/2018   VD25OH 22.45 (L) 02/02/2018   VD25OH 25.22 (L) 07/15/2016   VD25OH 23.18 (L)  11/20/2015   VD25OH 38.23 09/26/2015   VD25OH 22.84 (L) 06/28/2015   ROS: ENT: no sore throat, + see HPI  I reviewed pt's medications, allergies, PMH, social hx, family hx, and changes were documented in the history of present illness. Otherwise, unchanged from my initial visit note.  Past Medical History:  Diagnosis Date   Allergy    G E R D 02/22/2007   Genital warts    Hemorrhoids, internal 01/31/2011   Hypothyroidism    Migraine    Past Surgical History:  Procedure Laterality Date   APPENDECTOMY     DILATION AND CURETTAGE OF UTERUS     miscarriage     x3--required d and c   Social History   Socioeconomic History   Marital status: Married    Spouse name: Not on file   Number of children: Not on file   Years of education: Not on file   Highest education level: Not on file  Occupational History   Not on file  Tobacco Use   Smoking status: Former    Types: Cigarettes    Quit date: 01/31/1996    Years since quitting: 25.8   Smokeless tobacco: Never  Vaping Use   Vaping Use: Never used  Substance and Sexual Activity   Alcohol use: Yes    Alcohol/week: 0.0 standard drinks    Comment: occ   Drug use: No   Sexual activity: Yes    Partners: Male  Other Topics Concern   Not on file  Social History Narrative   Not on file   Social Determinants of Health   Financial Resource Strain: Not on file  Food Insecurity: Not on file  Transportation Needs: Not on file  Physical Activity: Not on file  Stress: Not on file  Social Connections: Not on file  Intimate Partner Violence: Not on file   Current Outpatient Medications on File Prior to Visit  Medication Sig Dispense Refill   ARMOUR THYROID 60 MG tablet TAKE 1 TABLET (60 MG TOTAL) BY MOUTH DAILY BEFORE BREAKFAST. 30 tablet 3   ARMOUR THYROID 90 MG tablet TAKE 1 TABLET BY MOUTH EVERY DAY 30 tablet 2   ASHWAGANDHA PO Take by mouth.     aspirin EC 81 MG tablet Take 81 mg daily by mouth.     CALCIUM PO Take 1 tablet  daily with lunch by mouth.     Cholecalciferol (VITAMIN D PO) Take 500 Units daily by mouth.     Cyanocobalamin (VITAMIN B-12 PO) Take 1 tablet daily with  lunch by mouth.     cyclobenzaprine (FLEXERIL) 10 MG tablet Take 1 tablet (10 mg total) by mouth 3 (three) times daily as needed for muscle spasms. 30 tablet 0   Fexofenadine HCl (ALLEGRA PO) Take 1 tablet daily as needed by mouth (seasonal allergies).     fluticasone (FLONASE) 50 MCG/ACT nasal spray Place 2 sprays into both nostrils daily. 16 g 0   loratadine (CLARITIN) 10 MG tablet Take 10 mg by mouth daily.     MAGNESIUM PO Take by mouth.     Multiple Vitamin (MULTIVITAMIN WITH MINERALS) TABS tablet Take 1 tablet daily with lunch by mouth.     oxaprozin (DAYPRO) 600 MG tablet TAKE 1 TABLET (600 MG TOTAL) BY MOUTH IN THE MORNING AND AT BEDTIME. 60 tablet 3   Pyridoxine HCl (VITAMIN B-6 PO) Take 1 tablet daily with lunch by mouth.     No current facility-administered medications on file prior to visit.   No Known Allergies Family History  Problem Relation Age of Onset   Stroke Mother    Arthritis Mother    Hypertension Father    Arthritis Father    Hyperlipidemia Father    Cancer Maternal Aunt        ovarian   Arthritis Maternal Grandmother    Arthritis Maternal Grandfather    Arthritis Paternal Grandmother    Arthritis Paternal Grandfather    Cancer Maternal Aunt        Breast   Cancer Maternal Aunt        Lung   Diabetes Neg Hx        family   Stomach cancer Neg Hx    Colon cancer Neg Hx    Pancreatic cancer Neg Hx    Esophageal cancer Neg Hx    Rectal cancer Neg Hx    PE: BP 122/74 (BP Location: Right Arm, Patient Position: Sitting, Cuff Size: Normal)   Pulse 65   Ht 5\' 9"  (1.753 m)   Wt 197 lb 12.8 oz (89.7 kg)   LMP 03/11/2013   SpO2 97%   BMI 29.21 kg/m  Wt Readings from Last 3 Encounters:  12/10/21 197 lb 12.8 oz (89.7 kg)  12/14/20 182 lb (82.6 kg)  09/17/20 205 lb (93 kg)   Constitutional: overweight,  in NAD Eyes: EOMI, no exophthalmos ENT: moist mucous membranes, no neck masses palpated, no cervical lymphadenopathy Cardiovascular: RRR, No MRG Respiratory: CTA B Musculoskeletal: no deformities Skin: moist, warm, no rashes Neurological: no tremor with outstretched hands  ASSESSMENT: 1. Hypothyroidism -Due to Hashimoto's thyroiditis  2.  Vitamin D insufficiency  3.  History of low B12 vitamin  PLAN:  1. Patient with longstanding Hashimoto's hypothyroidism, previously on levothyroxine and liothyronine, currently on Armour, on which she feels better.  Before switching to Armour, she had several complaints possibly related to hypothyroidism: Fatigue, constipation, cold intolerance.  We initially started 60 mg of Armour daily, however, we had to increase the dose to 90 mg daily 09/2018.  At last visit, we reduced the dose. - latest thyroid labs reviewed with pt. >> normal: Lab Results  Component Value Date   TSH 2.34 06/19/2021  - she continues on Armour 90 mg 5/7 days and 60 mg 2/7 days, and also on selenium 200 mcg daily - pt feels good on this dose. She gained 15 lbs since last OV - relaxed diet. - we discussed about taking the thyroid hormone every day, with water, >30 minutes before breakfast, separated by >4 hours from  acid reflux medications, calcium, iron, multivitamins. Pt. is taking it correctly. - will check thyroid tests today: TSH, fT3, and fT4 - If labs are abnormal, she will need to return for repeat TFTs in 1.5 months - OTW, I will see her back in a year  2.   Vitamin D insufficiency -She is on ~4000 units vitamin D daily (10,000 units 3x a week) - missed doses -Latest level was normal at last visit: Lab Results  Component Value Date   VD25OH 47.5 12/14/2020  -We will repeat the level today  3.  History of low B12 vitamin -Previously on 10,000 units vitamin B12, currently on 5000 units daily.  She had dizziness on the higher dose. -At last visit, B12 level was  normal, at 350 -We will repeat the level today  Needs refills.  Component     Latest Ref Rng 12/10/2021  T4,Free(Direct)     0.60 - 1.60 ng/dL 7.67   Triiodothyronine,Free,Serum     2.3 - 4.2 pg/mL 4.0   TSH     0.35 - 5.50 uIU/mL 2.51   VITD     30.00 - 100.00 ng/mL 54.20   Vitamin B12     211 - 911 pg/mL 1,134 (H)     Vitamin B12 is only slightly high.  For now, we can continue the same dose of Armour and vitamins.  Carlus Pavlov, MD PhD [facility] Endocr[facility]

## 2021-12-10 NOTE — Patient Instructions (Signed)
Please continue Armour 90 mg 5/7 days, 60 mg 2/7 days..  Take the thyroid hormone every day, with water, at least 30 minutes before breakfast, separated by at least 4 hours from: - acid reflux medications - calcium - iron - multivitamins  Please stop at the lab.  Continue: - B12 5000 mcg daily - vitamin D 4000 units daily  Please come for another appointment in 1 year

## 2021-12-16 ENCOUNTER — Ambulatory Visit: Payer: 59 | Admitting: Internal Medicine

## 2022-01-02 ENCOUNTER — Ambulatory Visit (INDEPENDENT_AMBULATORY_CARE_PROVIDER_SITE_OTHER): Payer: 59 | Admitting: Family

## 2022-01-02 ENCOUNTER — Encounter: Payer: Self-pay | Admitting: Family

## 2022-01-02 VITALS — BP 122/70 | HR 68 | Temp 98.2°F | Ht 67.75 in | Wt 192.2 lb

## 2022-01-02 DIAGNOSIS — N951 Menopausal and female climacteric states: Secondary | ICD-10-CM | POA: Insufficient documentation

## 2022-01-02 DIAGNOSIS — E038 Other specified hypothyroidism: Secondary | ICD-10-CM

## 2022-01-02 DIAGNOSIS — J302 Other seasonal allergic rhinitis: Secondary | ICD-10-CM

## 2022-01-02 DIAGNOSIS — E063 Autoimmune thyroiditis: Secondary | ICD-10-CM

## 2022-01-02 DIAGNOSIS — N6012 Diffuse cystic mastopathy of left breast: Secondary | ICD-10-CM

## 2022-01-02 DIAGNOSIS — N6011 Diffuse cystic mastopathy of right breast: Secondary | ICD-10-CM

## 2022-01-02 DIAGNOSIS — T7840XA Allergy, unspecified, initial encounter: Secondary | ICD-10-CM | POA: Insufficient documentation

## 2022-01-02 DIAGNOSIS — Z780 Asymptomatic menopausal state: Secondary | ICD-10-CM

## 2022-01-02 DIAGNOSIS — Z1231 Encounter for screening mammogram for malignant neoplasm of breast: Secondary | ICD-10-CM | POA: Diagnosis not present

## 2022-01-02 DIAGNOSIS — E039 Hypothyroidism, unspecified: Secondary | ICD-10-CM | POA: Insufficient documentation

## 2022-01-02 DIAGNOSIS — Z803 Family history of malignant neoplasm of breast: Secondary | ICD-10-CM

## 2022-01-02 HISTORY — DX: Family history of malignant neoplasm of breast: Z80.3

## 2022-01-02 NOTE — Assessment & Plan Note (Signed)
Bone density ordered pending results.   

## 2022-01-02 NOTE — Assessment & Plan Note (Signed)
Hormonal workup  tsh reviewed, desired range b12 folate ordered pending results.

## 2022-01-02 NOTE — Patient Instructions (Addendum)
Schedule pap for 2024   You have an appointment scheduled for: []   2D Mammogram  [x]   3D Mammogram  [x]   Bone Density     Your appointment will at the following location  []   [facility]  [address] [place] [facility] [place] [facility]  [phone]  []   [facility] at [facility])   [address]  [place], [facility]  [phone]  [x]   [facility]      [address] [place], [place]        [address]         []   [facility]  [address] [place], [place]  [place]  []  [facility]   [address]   [place], [facility] [address]  []  [facility]  [address] [place], [address] [phone]    Make sure to wear two piece  clothing  No lotions powders or deodorants the day of the appointment Make sure to bring picture ID and insurance card.  Bring list of medications you are currently taking including any supplements.     Let me know dosage of b12 you are taking.   Welcome to our clinic, I am happy to have you as my new patient. I am excited to continue on this healthcare journey with you.  Stop by the lab prior to leaving today. I will notify you of your results once received.   Please keep in mind Any my chart messages you send have up to a three business day turnaround for a response.  Phone calls may take up to a one full business day turnaround for a  response.   If you need a medication refill I recommend you request it through the pharmacy as this is easiest for [place] rather than sending a message and or phone call.   Due to recent changes in healthcare laws, you may see results of your imaging and/or laboratory studies on MyChart before I have had a chance to review them.  I understand that in some cases there may be  results that are confusing or concerning to you. Please understand that not all results are received at the same time and often I may need to interpret multiple results in order to provide you with the best plan of care or course of treatment. Therefore, I ask that you please give me 2 business days to thoroughly review all your results before contacting my office for clarification. Should we see a critical lab result, you will be contacted sooner.   It was a pleasure seeing you today! Please do not hesitate to reach out with any questions and or concerns.  Regards,   [id number] FNP-C

## 2022-01-02 NOTE — Assessment & Plan Note (Signed)
Continue f/u with endo  Consider armour as possible contribution to brain fog/

## 2022-01-02 NOTE — Progress Notes (Signed)
Established Patient Office Visit  Subjective:  Patient ID: Kelly Tran, female    DOB: 1965-12-22  Age: 56 y.o. MRN: [id number]  CC:  Chief Complaint  Patient presents with   New Patient (Initial Visit)    HPI Kelly Tran is here for a transition of care visit.  Prior provider was: Nicki Reaper, FNP  Pt is without acute concerns.   Colonoscopy 06/09/2019 negative, every ten years Mammogram 2019  Dexa 2019? Pap: 2021 - gyn pap negative   Wants hormone levels- menopausal symptoms not as much hot flashes these are tolerable, but with brain fog, words not coming to her as they should, fatigue all of the time. And also with bouts of anxiety. Wants cortisol levels checked.      01/02/2022    2:32 PM  GAD 7 : Generalized Anxiety Score  Nervous, Anxious, on Edge 1  Control/stop worrying 0  Worry too much - different things 0  Trouble relaxing 0  Restless 0  Easily annoyed or irritable 1  Afraid - awful might happen 0  Total GAD 7 Score 2  Anxiety Difficulty Somewhat difficult       01/02/2022    2:32 PM 09/17/2020    3:54 PM 08/13/2020   12:03 PM  PHQ9 SCORE ONLY  PHQ-9 Total Score 4 0 0     Wt Readings from Last 3 Encounters:  01/02/22 192 lb 4 oz (87.2 kg)  12/10/21 197 lb 12.8 oz (89.7 kg)  12/14/20 182 lb (82.6 kg)   chronic concerns:  Hypothyroid: seeing endo, alternating on armour 60 and 90 mg every other day.   Allergic rhinitis: claritin and flonase prn.   Muscle spasms prn: flexeril as needed.     Past Medical History:  Diagnosis Date   Allergy    External hemorrhoid 04/26/2013   G E R D 02/22/2007   Genital warts    GERD (gastroesophageal reflux disease) 11/20/2015   Hemorrhoids, internal 01/31/2011   Hypothyroidism    Migraine    Migraine 03/23/2018    Past Surgical History:  Procedure Laterality Date   APPENDECTOMY     DILATION AND CURETTAGE OF UTERUS     miscarriage     x6--required d and c    Family History  Problem  Relation Age of Onset   Stroke Mother    Arthritis Mother    Hypertension Father    Arthritis Father    Hyperlipidemia Father    Other Brother        car accident   Arthritis Maternal Grandmother    Arthritis Maternal Grandfather    Arthritis Paternal Grandmother    Arthritis Paternal Grandfather    Ovarian cancer Maternal Aunt    Breast cancer Maternal Aunt    Lung cancer Maternal Aunt    Diabetes Neg Hx        family   Stomach cancer Neg Hx    Colon cancer Neg Hx    Pancreatic cancer Neg Hx    Esophageal cancer Neg Hx    Rectal cancer Neg Hx     Social History   Socioeconomic History   Marital status: Married    Spouse name: Not on file   Number of children: 0   Years of education: Not on file   Highest education level: Not on file  Occupational History    Employer: [employer]    Comment: Arboriculturist  Tobacco Use   Smoking status: Former  Years: 30.00    Types: Cigarettes    Quit date: 01/31/1996    Years since quitting: 25.9   Smokeless tobacco: Never  Vaping Use   Vaping Use: Never used  Substance and Sexual Activity   Alcohol use: Yes    Alcohol/week: 0.0 standard drinks of alcohol    Comment: occ   Drug use: No   Sexual activity: Yes    Partners: Male    Birth control/protection: Post-menopausal  Other Topics Concern   Not on file  Social History Narrative   Not on file   Social Determinants of Health   Financial Resource Strain: Not on file  Food Insecurity: Not on file  Transportation Needs: Not on file  Physical Activity: Not on file  Stress: Not on file  Social Connections: Not on file  Intimate Partner Violence: Not on file    Outpatient Medications Prior to Visit  Medication Sig Dispense Refill   ARMOUR THYROID 60 MG tablet Take 1 tablet (60 mg total) by mouth daily before breakfast. Take before breakfast 2 out of 7 days. 30 tablet 3   ARMOUR THYROID 90 MG tablet Take 1 tablet (90 mg total) by mouth daily. Take  before breakfast 5 out of 7 days. 70 tablet 3   ASHWAGANDHA PO Take by mouth.     CALCIUM PO Take 1 tablet daily with lunch by mouth.     Cholecalciferol (VITAMIN D PO) Take 500 Units daily by mouth.     Cyanocobalamin (VITAMIN B-12 PO) Take 1 tablet daily with lunch by mouth.     cyclobenzaprine (FLEXERIL) 10 MG tablet Take 1 tablet (10 mg total) by mouth 3 (three) times daily as needed for muscle spasms. 30 tablet 0   fluticasone (FLONASE) 50 MCG/ACT nasal spray Place 2 sprays into both nostrils daily. 16 g 0   loratadine (CLARITIN) 10 MG tablet Take 10 mg by mouth daily.     MAGNESIUM PO Take by mouth.     Multiple Vitamin (MULTIVITAMIN WITH MINERALS) TABS tablet Take 1 tablet daily with lunch by mouth.     Pyridoxine HCl (VITAMIN B-6 PO) Take 1 tablet daily with lunch by mouth.     Fexofenadine HCl (ALLEGRA PO) Take 1 tablet daily as needed by mouth (seasonal allergies).     aspirin EC 81 MG tablet Take 81 mg daily by mouth.     oxaprozin (DAYPRO) 600 MG tablet TAKE 1 TABLET (600 MG TOTAL) BY MOUTH IN THE MORNING AND AT BEDTIME. 60 tablet 3   No facility-administered medications prior to visit.    No Known Allergies      Objective:    Physical Exam  Gen: NAD, resting comfortably CV: RRR with no murmurs appreciated Pulm: NWOB, CTAB with no crackles, wheezes, or rhonchi Skin: warm, dry Psych: Normal affect and thought content  BP 122/70   Pulse 68   Temp 98.2 F (36.8 C) (Temporal)   Ht 5' 7.75" (1.721 m)   Wt 192 lb 4 oz (87.2 kg)   LMP 03/11/2013   SpO2 98%   BMI 29.45 kg/m  Wt Readings from Last 3 Encounters:  01/02/22 192 lb 4 oz (87.2 kg)  12/10/21 197 lb 12.8 oz (89.7 kg)  12/14/20 182 lb (82.6 kg)     Health Maintenance Due  Topic Date Due   COVID-19 Vaccine (1) Never done   Zoster Vaccines- Shingrix (1 of 2) Never done   TETANUS/TDAP  04/13/2019   PAP SMEAR-Modifier  06/26/2019  MAMMOGRAM  08/26/2019    There are no preventive care reminders to  display for this patient.  Lab Results  Component Value Date   TSH 2.51 12/10/2021   Lab Results  Component Value Date   WBC 7.2 09/17/2020   HGB 12.8 09/17/2020   HCT 37.8 09/17/2020   MCV 87.8 09/17/2020   PLT 268.0 09/17/2020   Lab Results  Component Value Date   NA 140 09/17/2020   K 3.9 09/17/2020   CO2 28 09/17/2020   GLUCOSE 101 (H) 09/17/2020   BUN 18 09/17/2020   CREATININE 0.80 09/17/2020   BILITOT 1.0 09/17/2020   ALKPHOS 82 09/17/2020   AST 19 09/17/2020   ALT 25 09/17/2020   PROT 6.2 09/17/2020   ALBUMIN 4.1 09/17/2020   CALCIUM 9.4 09/17/2020   ANIONGAP 7 05/23/2017   GFR 82.11 09/17/2020   Lab Results  Component Value Date   CHOL 156 09/17/2020   Lab Results  Component Value Date   HDL 80.40 09/17/2020   Lab Results  Component Value Date   LDLCALC 47 09/17/2020   Lab Results  Component Value Date   TRIG 145.0 09/17/2020   Lab Results  Component Value Date   CHOLHDL 2 09/17/2020   Lab Results  Component Value Date   HGBA1C 5.4 09/17/2020      Assessment & Plan:   Problem List Items Addressed This Visit       Endocrine   Hypothyroidism due to Hashimoto's thyroiditis    Continue f/u with endo  Consider armour as possible contribution to brain fog/        Other   Seasonal allergies    Claritin, flonase prn       Encounter for screening mammogram for malignant neoplasm of breast    Mammogram ordered. Pending results.       Relevant Orders   MM 3D SCREEN BREAST BILATERAL   Post-menopausal    Bone density ordered pending results.        Relevant Orders   Luteinizing hormone   Follicle stimulating hormone   Estradiol   Testosterone   CBC   DG Bone Density   Symptoms, such as flushing, sleeplessness, headache, lack of concentration, associated with the menopause - Primary    Hormonal workup  tsh reviewed, desired range b12 folate ordered pending results.       Relevant Orders   Luteinizing hormone   Follicle  stimulating hormone   Estradiol   Testosterone   CBC   Comprehensive metabolic panel   Family history of breast cancer    Mammogram ordered. Pending results.       Relevant Orders   MM 3D SCREEN BREAST BILATERAL   Fibrocystic breast changes of both breasts   Relevant Orders   MM 3D SCREEN BREAST BILATERAL    No orders of the defined types were placed in this encounter.   Follow-up: Return in about 6 weeks (around 02/13/2022) for regular follow up on concerns.    Mort Sawyers, FNP

## 2022-01-02 NOTE — Assessment & Plan Note (Signed)
Mammogram ordered. Pending results. 

## 2022-01-02 NOTE — Assessment & Plan Note (Signed)
Claritin, flonase prn

## 2022-01-03 LAB — LUTEINIZING HORMONE: LH: 26.39 m[IU]/mL

## 2022-01-03 LAB — CBC
HCT: 40.1 % (ref 36.0–46.0)
Hemoglobin: 13.6 g/dL (ref 12.0–15.0)
MCHC: 33.8 g/dL (ref 30.0–36.0)
MCV: 89 fl (ref 78.0–100.0)
Platelets: 266 10*3/uL (ref 150.0–400.0)
RBC: 4.51 Mil/uL (ref 3.87–5.11)
RDW: 12.6 % (ref 11.5–15.5)
WBC: 7.3 10*3/uL (ref 4.0–10.5)

## 2022-01-03 LAB — COMPREHENSIVE METABOLIC PANEL
ALT: 15 U/L (ref 0–35)
AST: 13 U/L (ref 0–37)
Albumin: 4.6 g/dL (ref 3.5–5.2)
Alkaline Phosphatase: 77 U/L (ref 39–117)
BUN: 26 mg/dL — ABNORMAL HIGH (ref 6–23)
CO2: 27 mEq/L (ref 19–32)
Calcium: 9.8 mg/dL (ref 8.4–10.5)
Chloride: 102 mEq/L (ref 96–112)
Creatinine, Ser: 0.64 mg/dL (ref 0.40–1.20)
GFR: 97.59 mL/min (ref >60.00)
Glucose, Bld: 94 mg/dL (ref 70–99)
Potassium: 4 mEq/L (ref 3.5–5.1)
Sodium: 138 mEq/L (ref 135–145)
Total Bilirubin: 0.8 mg/dL (ref 0.2–1.2)
Total Protein: 6.8 g/dL (ref 6.0–8.3)

## 2022-01-03 LAB — ESTRADIOL: Estradiol: 16 pg/mL

## 2022-01-03 LAB — FOLLICLE STIMULATING HORMONE: FSH: 73 m[IU]/mL

## 2022-01-03 LAB — TESTOSTERONE: Testosterone: 6.5 ng/dL — ABNORMAL LOW (ref 15.00–40.00)

## 2022-01-28 ENCOUNTER — Ambulatory Visit
Admission: RE | Admit: 2022-01-28 | Discharge: 2022-01-28 | Disposition: A | Discharge status: Home / Self Care | Payer: 59 | Source: Ambulatory Visit | Attending: Family | Admitting: Family

## 2022-01-28 DIAGNOSIS — Z803 Family history of malignant neoplasm of breast: Secondary | ICD-10-CM

## 2022-01-28 DIAGNOSIS — Z1231 Encounter for screening mammogram for malignant neoplasm of breast: Secondary | ICD-10-CM

## 2022-01-28 DIAGNOSIS — N6011 Diffuse cystic mastopathy of right breast: Secondary | ICD-10-CM

## 2022-02-13 ENCOUNTER — Ambulatory Visit (INDEPENDENT_AMBULATORY_CARE_PROVIDER_SITE_OTHER): Payer: 59 | Admitting: Family

## 2022-02-13 ENCOUNTER — Encounter: Payer: Self-pay | Admitting: Family

## 2022-02-13 VITALS — BP 106/70 | HR 64 | Temp 98.6°F | Resp 16 | Ht 67.75 in | Wt 195.0 lb

## 2022-02-13 DIAGNOSIS — E038 Other specified hypothyroidism: Secondary | ICD-10-CM | POA: Diagnosis not present

## 2022-02-13 DIAGNOSIS — R5383 Other fatigue: Secondary | ICD-10-CM | POA: Diagnosis not present

## 2022-02-13 DIAGNOSIS — F411 Generalized anxiety disorder: Secondary | ICD-10-CM

## 2022-02-13 DIAGNOSIS — E063 Autoimmune thyroiditis: Secondary | ICD-10-CM

## 2022-02-13 NOTE — Patient Instructions (Signed)
Consider lexapro we can start at a low dose.   Due to recent changes in healthcare laws, you may see results of your imaging and/or laboratory studies on MyChart before I have had a chance to review them.  I understand that in some cases there may be results that are confusing or concerning to you. Please understand that not all results are received at the same time and often I may need to interpret multiple results in order to provide you with the best plan of care or course of treatment. Therefore, I ask that you please give me 2 business days to thoroughly review all your results before contacting my office for clarification. Should we see a critical lab result, you will be contacted sooner.   It was a pleasure seeing you today! Please do not hesitate to reach out with any questions and or concerns.  Regards,   Mort Sawyers FNP-C

## 2022-02-13 NOTE — Progress Notes (Signed)
Established Patient Office Visit  Subjective:  Patient ID: Kelly Tran, female    DOB: 1965-12-26  Age: 56 y.o. MRN: [id number]  CC:  Chief Complaint  Patient presents with   Headache    HPI Kelly Tran is here today for follow up on concerns.   Mammogram negative for malignancy repeat in one year, 01/28/22.  Dexa: 07/10/22 scheduled.   Excess B12 last lab: twice weekly of b12 5000 IU has decreased since last lab work.   Still with fatigue, memory concerns, and brain fog.  She does have hypothyroid, she is on armour with endocrinologist.  Lab Results  Component Value Date   TSH 2.51 12/10/2021    She is still a bit concerned because she will know what she is trying to say, but then another word will come out in its place. Does has increased stress, job is stressful.  No dementia to her knowledge in the family.     Mini-Cog - 02/13/22 1410     Normal clock drawing test? yes    How many words correct? 3               02/13/2022    2:11 PM  MMSE - Mini Mental State Exam  Orientation to time 5  Orientation to Place 5  Registration 3  Attention/ Calculation 5  Recall 3  Language- name 2 objects 2  Language- repeat 1  Language- follow 3 step command 3  Language- read & follow direction 1  Write a sentence 1  Copy design 1  Total score 30        Past Medical History:  Diagnosis Date   Allergy    External hemorrhoid 04/26/2013   Family history of breast cancer 01/02/2022   G E R D 02/22/2007   Genital warts    GERD (gastroesophageal reflux disease) 11/20/2015   Hemorrhoids, internal 01/31/2011   Hypothyroidism    Migraine    Migraine 03/23/2018    Past Surgical History:  Procedure Laterality Date   APPENDECTOMY     DILATION AND CURETTAGE OF UTERUS     miscarriage     x6--required d and c    Family History  Problem Relation Age of Onset   Stroke Mother    Arthritis Mother    Hypertension Father    Arthritis Father    Hyperlipidemia  Father    Other Brother        car accident   Arthritis Maternal Grandmother    Arthritis Maternal Grandfather    Arthritis Paternal Grandmother    Arthritis Paternal Grandfather    Ovarian cancer Maternal Aunt    Breast cancer Maternal Aunt    Lung cancer Maternal Aunt    Diabetes Neg Hx        family   Stomach cancer Neg Hx    Colon cancer Neg Hx    Pancreatic cancer Neg Hx    Esophageal cancer Neg Hx    Rectal cancer Neg Hx     Social History   Socioeconomic History   Marital status: Married    Spouse name: Not on file   Number of children: 0   Years of education: Not on file   Highest education level: Not on file  Occupational History    Employer: [employer]    Comment: Arboriculturist  Tobacco Use   Smoking status: Former    Years: 30.00    Types: Cigarettes    Quit date: 01/31/1996  Years since quitting: 26.0   Smokeless tobacco: Never  Vaping Use   Vaping Use: Never used  Substance and Sexual Activity   Alcohol use: Yes    Alcohol/week: 0.0 standard drinks of alcohol    Comment: occ   Drug use: No   Sexual activity: Yes    Partners: Male    Birth control/protection: Post-menopausal  Other Topics Concern   Not on file  Social History Narrative   Not on file   Social Determinants of Health   Financial Resource Strain: Not on file  Food Insecurity: Not on file  Transportation Needs: Not on file  Physical Activity: Not on file  Stress: Not on file  Social Connections: Not on file  Intimate Partner Violence: Not on file    Outpatient Medications Prior to Visit  Medication Sig Dispense Refill   ARMOUR THYROID 60 MG tablet Take 1 tablet (60 mg total) by mouth daily before breakfast. Take before breakfast 2 out of 7 days. 30 tablet 3   ARMOUR THYROID 90 MG tablet Take 1 tablet (90 mg total) by mouth daily. Take before breakfast 5 out of 7 days. 70 tablet 3   ASHWAGANDHA PO Take by mouth.     CALCIUM PO Take 1 tablet daily with lunch  by mouth.     Cholecalciferol (VITAMIN D PO) Take 500 Units daily by mouth.     Cyanocobalamin (VITAMIN B-12 PO) Take 1 tablet daily with lunch by mouth.     cyclobenzaprine (FLEXERIL) 10 MG tablet Take 1 tablet (10 mg total) by mouth 3 (three) times daily as needed for muscle spasms. 30 tablet 0   fluticasone (FLONASE) 50 MCG/ACT nasal spray Place 2 sprays into both nostrils daily. 16 g 0   loratadine (CLARITIN) 10 MG tablet Take 10 mg by mouth daily.     MAGNESIUM PO Take by mouth.     Multiple Vitamin (MULTIVITAMIN WITH MINERALS) TABS tablet Take 1 tablet daily with lunch by mouth.     Pyridoxine HCl (VITAMIN B-6 PO) Take 1 tablet daily with lunch by mouth.     No facility-administered medications prior to visit.    No Known Allergies        Objective:    Physical Exam Constitutional:      General: She is not in acute distress.    Appearance: Normal appearance. She is obese. She is not ill-appearing, toxic-appearing or diaphoretic.  Cardiovascular:     Rate and Rhythm: Normal rate and regular rhythm.  Pulmonary:     Effort: Pulmonary effort is normal.  Neurological:     General: No focal deficit present.     Mental Status: She is alert and oriented to person, place, and time. Mental status is at baseline.  Psychiatric:        Mood and Affect: Mood normal.        Behavior: Behavior normal.        Thought Content: Thought content normal.        Judgment: Judgment normal.      BP 106/70   Pulse 64   Temp 98.6 F (37 C)   Resp 16   Ht 5' 7.75" (1.721 m)   Wt 195 lb (88.5 kg)   LMP 03/11/2013   SpO2 96%   BMI 29.87 kg/m  Wt Readings from Last 3 Encounters:  02/13/22 195 lb (88.5 kg)  01/02/22 192 lb 4 oz (87.2 kg)  12/10/21 197 lb 12.8 oz (89.7 kg)  Health Maintenance Due  Topic Date Due   COVID-19 Vaccine (1) Never done   Zoster Vaccines- Shingrix (1 of 2) Never done   TETANUS/TDAP  04/13/2019   PAP SMEAR-Modifier  06/26/2019   INFLUENZA VACCINE   02/04/2022    There are no preventive care reminders to display for this patient.  Lab Results  Component Value Date   TSH 2.51 12/10/2021   Lab Results  Component Value Date   WBC 7.3 01/02/2022   HGB 13.6 01/02/2022   HCT 40.1 01/02/2022   MCV 89.0 01/02/2022   PLT 266.0 01/02/2022   Lab Results  Component Value Date   NA 138 01/02/2022   K 4.0 01/02/2022   CO2 27 01/02/2022   GLUCOSE 94 01/02/2022   BUN 26 (H) 01/02/2022   CREATININE 0.64 01/02/2022   BILITOT 0.8 01/02/2022   ALKPHOS 77 01/02/2022   AST 13 01/02/2022   ALT 15 01/02/2022   PROT 6.8 01/02/2022   ALBUMIN 4.6 01/02/2022   CALCIUM 9.8 01/02/2022   ANIONGAP 7 05/23/2017   GFR 97.59 01/02/2022   Lab Results  Component Value Date   CHOL 156 09/17/2020   Lab Results  Component Value Date   HDL 80.40 09/17/2020   Lab Results  Component Value Date   LDLCALC 47 09/17/2020   Lab Results  Component Value Date   TRIG 145.0 09/17/2020   Lab Results  Component Value Date   CHOLHDL 2 09/17/2020   Lab Results  Component Value Date   HGBA1C 5.4 09/17/2020      Assessment & Plan:   Problem List Items Addressed This Visit       Endocrine   Hypothyroidism due to Hashimoto's thyroiditis    Reviewed tsh  F/u with endo as scheduled Continue medication as prescribed.         Other   GAD (generalized anxiety disorder) - Primary    Long talk with patient on options. Have discussed lexapro, pt would like to consider further.  Also recommended therapy, handout given for behavioral care.  I think this may interplay with decreased concentration and focus.       Other fatigue    Consider MMSE and mini cog in the future.  However I feel more likely due to increased stress lately.  Decrease b12 intake, too high.  Consider discussing armour with endo, maybe this is contributing? Exercise as tolerated as this is helpful.  Goal for sleep is 7-8 hours nightly        No orders of the defined  types were placed in this encounter.   Follow-up: Return in about 6 months (around 08/16/2022) for follow up .    Mort Sawyers, FNP

## 2022-02-21 DIAGNOSIS — F411 Generalized anxiety disorder: Secondary | ICD-10-CM | POA: Insufficient documentation

## 2022-02-21 DIAGNOSIS — R5383 Other fatigue: Secondary | ICD-10-CM | POA: Insufficient documentation

## 2022-02-21 NOTE — Assessment & Plan Note (Signed)
Consider MMSE and mini cog in the future.  However I feel more likely due to increased stress lately.  Decrease b12 intake, too high.  Consider discussing armour with endo, maybe this is contributing? Exercise as tolerated as this is helpful.  Goal for sleep is 7-8 hours nightly

## 2022-02-21 NOTE — Assessment & Plan Note (Addendum)
Reviewed tsh  F/u with endo as scheduled Continue medication as prescribed.

## 2022-02-21 NOTE — Assessment & Plan Note (Addendum)
Long talk with patient on options. Have discussed lexapro, pt would like to consider further.  Also recommended therapy, handout given for behavioral care.  I think this may interplay with decreased concentration and focus.

## 2022-03-26 ENCOUNTER — Encounter: Payer: Self-pay | Admitting: Family Medicine

## 2022-03-26 ENCOUNTER — Ambulatory Visit (INDEPENDENT_AMBULATORY_CARE_PROVIDER_SITE_OTHER): Payer: 59 | Admitting: Family Medicine

## 2022-03-26 VITALS — BP 128/78 | HR 75 | Temp 97.3°F | Ht 67.75 in | Wt 196.4 lb

## 2022-03-26 DIAGNOSIS — S29012A Strain of muscle and tendon of back wall of thorax, initial encounter: Secondary | ICD-10-CM | POA: Insufficient documentation

## 2022-03-26 MED ORDER — CYCLOBENZAPRINE HCL 10 MG PO TABS: 10.0000 mg | ORAL_TABLET | Freq: Three times a day (TID) | ORAL | 0 refills | Status: DC | PRN

## 2022-03-26 MED ORDER — MELOXICAM 15 MG PO TABS: ORAL_TABLET | ORAL | 0 refills | Status: DC

## 2022-03-26 NOTE — Assessment & Plan Note (Addendum)
Exam most consistent with right lat dorsi strain, not consistent with herniated disc.  Discussed with patient, rec continued conservative management including NSAID, MR, ice/heat, gentle stretching. She may also use her TENS unit.  Update if not improving with treatment, sooner if any worsening symptoms.

## 2022-03-26 NOTE — Patient Instructions (Signed)
I think you have muscle strain/tear of right mid back.  Continue care as up to now - TENS unit as needed, ice/heat, continue flexeril muscle relaxant and start meloxicam anti inflammatory as discussed.  Gentle stretching exercises.  Avoid exacerbating movements. This can take 4 weeks to fully heal.  Let us know if not improving with this treatment or any worsening.

## 2022-03-26 NOTE — Progress Notes (Signed)
Patient ID: Kelly Tran, female    DOB: 1965-10-24, 56 y.o.   MRN: [id number]  This visit was conducted in person.  BP 128/78   Pulse 75   Temp (!) 97.3 F (36.3 C) (Temporal)   Ht 5' 7.75" (1.721 m)   Wt 196 lb 6 oz (89.1 kg)   LMP 03/11/2013   SpO2 98%   BMI 30.08 kg/m    CC: "I think I pulled a muscle" Subjective:   HPI: Kelly Tran is a 56 y.o. female presenting on 03/26/2022 for Back Pain (C/o pulled muscle moving an end table while vacuuming.  Happened 03/16/22. Pain mostly occurs going to standing position. )   DOI: 03/16/2022 While moving end table to vacuum felt sharp pain to right lower back.   Notes pain from R mid/lower back with radiation down lateral hip to anterior proximal hip, worse with standing - pain overlying R lateral hip. This feels more painful than a muscle strain. No bruising to the skin or other skin changes.   Treating with ice, ibuprofen, tylenol, flexeril with benefit and deep blue topically. She also has been using TENS unit with benefit.  Symptoms overall improving - feels better in the morning, but then pain recurs as day goes on.      Relevant past medical, surgical, family and social history reviewed and updated as indicated. Interim medical history since our last visit reviewed. Allergies and medications reviewed and updated. Outpatient Medications Prior to Visit  Medication Sig Dispense Refill   ARMOUR THYROID 60 MG tablet Take 1 tablet (60 mg total) by mouth daily before breakfast. Take before breakfast 2 out of 7 days. 30 tablet 3   ARMOUR THYROID 90 MG tablet Take 1 tablet (90 mg total) by mouth daily. Take before breakfast 5 out of 7 days. 70 tablet 3   ASHWAGANDHA PO Take by mouth.     CALCIUM PO Take 1 tablet daily with lunch by mouth.     Cholecalciferol (VITAMIN D PO) Take 500 Units daily by mouth.     Cyanocobalamin (VITAMIN B-12 PO) Take 1 tablet daily with lunch by mouth.     fluticasone (FLONASE) 50 MCG/ACT nasal  spray Place 2 sprays into both nostrils daily. 16 g 0   loratadine (CLARITIN) 10 MG tablet Take 10 mg by mouth daily.     MAGNESIUM PO Take by mouth.     Multiple Vitamin (MULTIVITAMIN WITH MINERALS) TABS tablet Take 1 tablet daily with lunch by mouth.     Pyridoxine HCl (VITAMIN B-6 PO) Take 1 tablet daily with lunch by mouth.     cyclobenzaprine (FLEXERIL) 10 MG tablet Take 1 tablet (10 mg total) by mouth 3 (three) times daily as needed for muscle spasms. 30 tablet 0   No facility-administered medications prior to visit.     Per HPI unless specifically indicated in ROS section below Review of Systems  Objective:  BP 128/78   Pulse 75   Temp (!) 97.3 F (36.3 C) (Temporal)   Ht 5' 7.75" (1.721 m)   Wt 196 lb 6 oz (89.1 kg)   LMP 03/11/2013   SpO2 98%   BMI 30.08 kg/m   Wt Readings from Last 3 Encounters:  03/26/22 196 lb 6 oz (89.1 kg)  02/13/22 195 lb (88.5 kg)  01/02/22 192 lb 4 oz (87.2 kg)      Physical Exam Vitals and nursing note reviewed.  Constitutional:      Appearance: Normal appearance.  She is not ill-appearing.  Musculoskeletal:        General: Tenderness present. Normal range of motion.       Arms:     Right lower leg: No edema.     Left lower leg: No edema.     Comments:  Mild discomfort mid lumbar midline spine ++ paraspinous mm tenderness upper lumbar and R lat dorsi tenderness to palpation Neg SLR bilaterally. No pain with int/ext rotation at hip. Neg FABER. No pain at GTB or sciatic notch bilaterally. + pain at R SIJ Uncomfortable with transition from sitting to standing or with laying back supine on exam table No pain with testing groin muscles (hip flexors, abductors, adductors) against resistance. R back pain reproduced with testing L knee flexion against resistance  Skin:    General: Skin is warm and dry.     Findings: No rash.  Neurological:     Mental Status: She is alert.     Comments:  5/5 strength BLE Sensation intact  Psychiatric:         Mood and Affect: Mood normal.        Behavior: Behavior normal.       Lab Results  Component Value Date   CREATININE 0.64 01/02/2022   BUN 26 (H) 01/02/2022   NA 138 01/02/2022   K 4.0 01/02/2022   CL 102 01/02/2022   CO2 27 01/02/2022     Assessment & Plan:   Problem List Items Addressed This Visit     Strain of latissimus dorsi muscle - Primary    Exam most consistent with right lat dorsi strain, not consistent with herniated disc.  Discussed with patient, rec continued conservative management including NSAID, MR, ice/heat, gentle stretching. She may also use her TENS unit.  Update if not improving with treatment, sooner if any worsening symptoms.         Meds ordered this encounter  Medications   cyclobenzaprine (FLEXERIL) 10 MG tablet    Sig: Take 1 tablet (10 mg total) by mouth 3 (three) times daily as needed for muscle spasms.    Dispense:  30 tablet    Refill:  0   meloxicam (MOBIC) 15 MG tablet    Sig: Take one oral daily for 1 wk with food then as needed pain    Dispense:  30 tablet    Refill:  0   No orders of the defined types were placed in this encounter.    Patient Instructions  I think you have muscle strain/tear of right mid back.  Continue care as up to now - TENS unit as needed, ice/heat, continue flexeril muscle relaxant and start meloxicam anti inflammatory as discussed.  Gentle stretching exercises.  Avoid exacerbating movements. This can take 4 weeks to fully heal.  Let us know if not improving with this treatment or any worsening.   Follow up plan: Return if symptoms worsen or fail to improve.  Ria Bush, MD

## 2022-04-22 ENCOUNTER — Other Ambulatory Visit: Payer: Self-pay | Admitting: Family Medicine

## 2022-04-23 ENCOUNTER — Other Ambulatory Visit: Payer: Self-pay

## 2022-04-23 NOTE — Telephone Encounter (Signed)
C we ask pt how often they take meloxicam?

## 2022-07-10 ENCOUNTER — Inpatient Hospital Stay: Admission: RE | Admit: 2022-07-10 | Payer: 59 | Source: Ambulatory Visit

## 2022-07-18 ENCOUNTER — Other Ambulatory Visit: Payer: 59

## 2022-09-15 ENCOUNTER — Encounter: Payer: Self-pay | Admitting: Nurse Practitioner

## 2022-09-15 ENCOUNTER — Ambulatory Visit (INDEPENDENT_AMBULATORY_CARE_PROVIDER_SITE_OTHER): Payer: 59 | Admitting: Nurse Practitioner

## 2022-09-15 VITALS — BP 122/76 | HR 70 | Temp 98.3°F | Resp 16 | Ht 67.75 in | Wt 199.5 lb

## 2022-09-15 DIAGNOSIS — B029 Zoster without complications: Secondary | ICD-10-CM

## 2022-09-15 NOTE — Assessment & Plan Note (Signed)
Patient has a history of the same.  Same presentation being on the hard palate of mouth.  No cutaneous outside skin eruptions as of yet.  She does have some tingling sensation to her left mid facial nerve branch told patient if rash develops on face she will need to see ophthalmology to make sure he does not have any eye involvement.  Patient does wear glasses.  Patient was seen by telehealth visit and started on valacyclovir 1 g 3 times daily.  Continue medication.  Patient not having any overt pain will hold off on sending any gabapentin at this juncture.  Patient is able to eat and drink fine.  Follow-up if no improvement

## 2022-09-15 NOTE — Progress Notes (Signed)
Acute Office Visit  Subjective:     Patient ID: Kelly Tran, female    DOB: [birth year], 57 y.o.   MRN: [id number]  Chief Complaint  Patient presents with   Herpes Zoster    In mouth X yesterday first sign of the rash    HPI Patient is in today for rash with a history of GERDm hypothyroidism, herpes zoster.  Symptoms started  States on Wednesday night she got a blister in the roof of her mouth that peeled and was gone.  States that she has a rash  on the roof of the mouth left sided.that started yesterday morning . States that she is having a tingling/numbness sensatoin on the left  States that she was seen by tele doc yesterday and she was written valtrex. States that she has had 3 doses. Review of Systems  Constitutional:  Negative for chills and fever.  Respiratory:  Negative for shortness of breath.   Cardiovascular:  Negative for chest pain.  Skin:  Positive for rash.  Neurological:  Positive for tingling. Negative for headaches.        Objective:    BP 122/76   Pulse 70   Temp 98.3 F (36.8 C)   Resp 16   Ht 5' 7.75" (1.721 m)   Wt 199 lb 8 oz (90.5 kg)   LMP 03/11/2013   SpO2 99%   BMI 30.56 kg/m    Physical Exam Vitals and nursing note reviewed.  Constitutional:      Appearance: Normal appearance.  HENT:     Right Ear: Tympanic membrane, ear canal and external ear normal.     Left Ear: Tympanic membrane, ear canal and external ear normal.     Mouth/Throat:     Mouth: Mucous membranes are moist.      Comments: Papular style rash on the left lateral anterior hard palate Lymphadenopathy:     Cervical: No cervical adenopathy.  Neurological:     General: No focal deficit present.     Mental Status: She is alert.     Cranial Nerves: Cranial nerves 2-12 are intact.     Sensory: Sensory deficit present.     Motor: Motor function is intact.     Gait: Gait is intact.     Deep Tendon Reflexes:     Reflex Scores:      Bicep reflexes are 2+ on the  right side and 2+ on the left side.      Patellar reflexes are 2+ on the right side and 2+ on the left side.    Comments: Bilateral upper and lower extremity strength 5/5     No results found for any visits on 09/15/22.      Assessment & Plan:   Problem List Items Addressed This Visit       Other   Herpes zoster - Primary    Patient has a history of the same.  Same presentation being on the hard palate of mouth.  No cutaneous outside skin eruptions as of yet.  She does have some tingling sensation to her left mid facial nerve branch told patient if rash develops on face she will need to see ophthalmology to make sure he does not have any eye involvement.  Patient does wear glasses.  Patient was seen by telehealth visit and started on valacyclovir 1 g 3 times daily.  Continue medication.  Patient not having any overt pain will hold off on sending any gabapentin at this juncture.  Patient is able to eat and drink fine.  Follow-up if no improvement      Relevant Medications   valACYclovir (VALTREX) 1000 MG tablet    No orders of the defined types were placed in this encounter.   Return if symptoms worsen or fail to improve.  Romilda Garret, NP

## 2022-09-15 NOTE — Patient Instructions (Signed)
Nice to see you today Take the Valtrex as prescribed Let me know if you develop lots of discomfort outside of eating If the rash appears on the face let your eye doctor know and get an eye exam done

## 2022-12-07 ENCOUNTER — Other Ambulatory Visit: Payer: Self-pay | Admitting: Internal Medicine

## 2022-12-07 DIAGNOSIS — E038 Other specified hypothyroidism: Secondary | ICD-10-CM

## 2022-12-24 ENCOUNTER — Ambulatory Visit: Payer: 59 | Admitting: Internal Medicine

## 2022-12-25 ENCOUNTER — Other Ambulatory Visit: Payer: 59

## 2023-01-01 ENCOUNTER — Ambulatory Visit (INDEPENDENT_AMBULATORY_CARE_PROVIDER_SITE_OTHER): Payer: 59 | Admitting: Internal Medicine

## 2023-01-01 ENCOUNTER — Encounter: Payer: Self-pay | Admitting: Internal Medicine

## 2023-01-01 VITALS — BP 118/70 | HR 87 | Ht 67.75 in | Wt 196.6 lb

## 2023-01-01 DIAGNOSIS — E063 Autoimmune thyroiditis: Secondary | ICD-10-CM | POA: Diagnosis not present

## 2023-01-01 DIAGNOSIS — E038 Other specified hypothyroidism: Secondary | ICD-10-CM

## 2023-01-01 DIAGNOSIS — E538 Deficiency of other specified B group vitamins: Secondary | ICD-10-CM | POA: Diagnosis not present

## 2023-01-01 DIAGNOSIS — E559 Vitamin D deficiency, unspecified: Secondary | ICD-10-CM | POA: Diagnosis not present

## 2023-01-01 NOTE — Progress Notes (Addendum)
Patient ID: [name], female   DOB: 12-22-1965, 57 y.o.   MRN: [id number]    HPI  Kelly Tran is a 57 y.o.-year-old female, initially referred by her PCP, Kelly Sawyers, FNP, returning for follow-up for Hashimoto's hypothyroidism.  He previously saw Dr. Lucianne Tran, last visit 01/2018.  I first saw the patient in 07/2018.  Last visit with me 1 year ago.  Interim history: She gained 15 lbs before last visit, but weight is stable since then.  She was previously on a keto diet and lost 20 pounds in 2 to 3 months.  She did not feel well and relaxed her diet afterwards. She had a recent URI when she returned from a cruise in [place].  She still feels tired and not back to baseline.  Reviewed history: Pt. has been dx with hypothyroidism in 11/2011.  She was initially started on levothyroxine, but she did not tolerate this well.  She then was changed to Armour in 07/2012 and 45 mg daily, then increase to 60 mg daily in 06/2015.  However, she developed palpitations (stopped after stopping amitriptyline).  Her medication was changed to levothyroxine 50 mcg + liothyronine 10 mg daily in 02/2017.  This was increased to 8 tablets of levothyroxine and 14 tablets of liothyronine a week in 05/2018.  At that time, TSH was 3.77.  However, she was complaining of fatigue, cold intolerance, hair loss, and constipation and we switched to Armour thyroid.  Her symptoms improved on this, with the exception of hair loss.  She is on Armour 90 mg 5/7 days and 60 mg 2/7 days.  She takes this: - in am - fasting - at least 30 min from b'fast - no iron, PPIs - + calcium, multivitamins - at night - + Biotin 10,000 mcg daily - off, but plans to restart Off Ashwaghanda, on Mg - for sleep.  Reviewed her TFTs: Lab Results  Component Value Date   TSH 2.51 12/10/2021   TSH 2.34 06/19/2021   TSH 0.26 (L) 12/14/2020   TSH 0.11 (L) 09/17/2020   TSH 0.59 12/15/2019   TSH 0.27 (L) 06/20/2019   TSH 1.26 11/10/2018   TSH  6.91 (H) 09/21/2018   TSH 7.30 (H) 08/06/2018   TSH 0.84 06/22/2018   FREET4 0.68 12/10/2021   FREET4 0.63 06/19/2021   FREET4 0.82 12/14/2020   FREET4 0.66 09/17/2020   FREET4 0.69 12/15/2019   FREET4 0.80 06/20/2019   FREET4 0.56 (L) 11/10/2018   FREET4 0.57 (L) 09/21/2018   FREET4 0.58 (L) 08/06/2018   FREET4 0.77 06/22/2018   T3FREE 4.0 12/10/2021   T3FREE 4.1 06/19/2021   T3FREE 4.7 (H) 12/14/2020   T3FREE 4.4 (H) 12/15/2019   T3FREE 4.7 (H) 06/20/2019   T3FREE 2.8 11/10/2018   T3FREE 3.8 09/21/2018   T3FREE 3.3 08/06/2018   T3FREE 3.7 06/22/2018   T3FREE 3.3 01/12/2018   Her TPO antibodies are elevated: Component     Latest Ref Rng & Units 12/14/2020  Thyroglobulin Ab     < or = 1 IU/mL <1  Thyroperoxidase Ab SerPl-aCnc     <9 IU/mL 481 (H)   Component     Latest Ref Rng & Units 06/22/2018 11/10/2018  Thyroglobulin Ab     < or = 1 IU/mL <1   Thyroperoxidase Ab SerPl-aCnc     <9 IU/mL 822 (H) 770 (H)   We started selenium 200 mcg daily in 07/2018.  She also started to improve her diet to reduce gluten,  nuts, dairy. Now off, but will restart.  Pt denies: - feeling nodules in neck - hoarseness - dysphagia - choking  She has no FH of thyroid disorders.No FH of thyroid cancer. No h/o radiation tx to head or neck. No herbal supplements. No recent steroids use.   Pt. also has a history of GERD (drinks water + baking soda), multiple pregnancy losses. She has a history of: -low vitamin B-12 (on liquid b12  -dose decreased from 10,000 to 5000 units daily in 12/2019 -she was off the supplement and only restarted it 3 days ago)  -Vitamin D deficiency (on vitamin D 4000 units daily - not every day), and also low testosterone.  Lab Results  Component Value Date   VITAMINB12 1,134 (H) 12/10/2021   VITAMINB12 350 12/14/2020   VITAMINB12 >1526 (H) 12/15/2019   VITAMINB12 357 05/21/2018   VITAMINB12 220 02/02/2018   VITAMINB12 392 07/15/2016   VITAMINB12 452 06/28/2015    VITAMINB12 295 08/08/2008   Lab Results  Component Value Date   VD25OH 54.20 12/10/2021   VD25OH 47.5 12/14/2020   VD25OH 60.9 12/15/2019   VD25OH 32.55 05/21/2018   VD25OH 22.45 (L) 02/02/2018   VD25OH 25.22 (L) 07/15/2016   VD25OH 23.18 (L) 11/20/2015   VD25OH 38.23 09/26/2015   VD25OH 22.84 (L) 06/28/2015   ROS: + see HPI  I reviewed pt's medications, allergies, PMH, social hx, family hx, and changes were documented in the history of present illness. Otherwise, unchanged from my initial visit note.  Past Medical History:  Diagnosis Date   Allergy    External hemorrhoid 04/26/2013   Family history of breast cancer 01/02/2022   G E R D 02/22/2007   Genital warts    GERD (gastroesophageal reflux disease) 11/20/2015   Hemorrhoids, internal 01/31/2011   Hypothyroidism    Migraine    Migraine 03/23/2018   Past Surgical History:  Procedure Laterality Date   APPENDECTOMY     DILATION AND CURETTAGE OF UTERUS     miscarriage     x6--required d and c   Social History   Socioeconomic History   Marital status: Married    Spouse name: Not on file   Number of children: 0   Years of education: Not on file   Highest education level: Not on file  Occupational History    Employer: [employer]    Comment: Arboriculturist  Tobacco Use   Smoking status: Former    Years: 30    Types: Cigarettes    Quit date: 01/31/1996    Years since quitting: 26.9   Smokeless tobacco: Never  Vaping Use   Vaping Use: Never used  Substance and Sexual Activity   Alcohol use: Yes    Alcohol/week: 0.0 standard drinks of alcohol    Comment: occ   Drug use: No   Sexual activity: Yes    Partners: Male    Birth control/protection: Post-menopausal  Other Topics Concern   Not on file  Social History Narrative   Not on file   Social Determinants of Health   Financial Resource Strain: Not on file  Food Insecurity: Not on file  Transportation Needs: Not on file  Physical  Activity: Not on file  Stress: Not on file  Social Connections: Not on file  Intimate Partner Violence: Not on file   Current Outpatient Medications on File Prior to Visit  Medication Sig Dispense Refill   ARMOUR THYROID 60 MG tablet TAKE 1 TABLET (60 MG TOTAL) BY MOUTH  DAILY BEFORE BREAKFAST. TAKE BEFORE BREAKFAST 2 OUT OF 7 DAYS. 10 tablet 3   ARMOUR THYROID 90 MG tablet Take 1 tablet (90 mg total) by mouth daily. Take before breakfast 5 out of 7 days. 70 tablet 3   ASHWAGANDHA PO Take by mouth.     CALCIUM PO Take 1 tablet daily with lunch by mouth.     Cholecalciferol (VITAMIN D PO) Take 500 Units daily by mouth.     Cyanocobalamin (VITAMIN B-12 PO) Take 1 tablet daily with lunch by mouth.     cyclobenzaprine (FLEXERIL) 10 MG tablet Take 1 tablet (10 mg total) by mouth 3 (three) times daily as needed for muscle spasms. 30 tablet 0   fluticasone (FLONASE) 50 MCG/ACT nasal spray Place 2 sprays into both nostrils daily. 16 g 0   loratadine (CLARITIN) 10 MG tablet Take 10 mg by mouth daily.     MAGNESIUM PO Take by mouth.     meloxicam (MOBIC) 15 MG tablet Take one oral daily for 1 wk with food then as needed pain 30 tablet 0   Multiple Vitamin (MULTIVITAMIN WITH MINERALS) TABS tablet Take 1 tablet daily with lunch by mouth.     Pyridoxine HCl (VITAMIN B-6 PO) Take 1 tablet daily with lunch by mouth.     valACYclovir (VALTREX) 1000 MG tablet Take 1,000 mg by mouth 3 (three) times daily.     No current facility-administered medications on file prior to visit.   No Known Allergies Family History  Problem Relation Age of Onset   Stroke Mother    Arthritis Mother    Hypertension Father    Arthritis Father    Hyperlipidemia Father    Other Brother        car accident   Arthritis Maternal Grandmother    Arthritis Maternal Grandfather    Arthritis Paternal Grandmother    Arthritis Paternal Grandfather    Ovarian cancer Maternal Aunt    Breast cancer Maternal Aunt    Lung cancer  Maternal Aunt    Diabetes Neg Hx        family   Stomach cancer Neg Hx    Colon cancer Neg Hx    Pancreatic cancer Neg Hx    Esophageal cancer Neg Hx    Rectal cancer Neg Hx    PE: BP 118/70   Pulse 87   Ht 5' 7.75" (1.721 m)   Wt 196 lb 9.6 oz (89.2 kg)   LMP 03/11/2013   SpO2 97%   BMI 30.11 kg/m  Wt Readings from Last 3 Encounters:  01/01/23 196 lb 9.6 oz (89.2 kg)  09/15/22 199 lb 8 oz (90.5 kg)  03/26/22 196 lb 6 oz (89.1 kg)   Constitutional: overweight, in NAD Eyes: EOMI, no exophthalmos ENT: no neck masses palpated, no cervical lymphadenopathy Cardiovascular: RRR, No MRG Respiratory: CTA B Musculoskeletal: no deformities Skin: no rashes Neurological: no tremor with outstretched hands  ASSESSMENT: 1. Hypothyroidism -Due to Hashimoto's thyroiditis  2.  Vitamin D insufficiency  3.  History of low B12 vitamin  PLAN:  1. Patient with longstanding Hashimoto's hypothyroidism, previously on levothyroxine and liothyronine, currently on Armour, on which she feels better.  Before switching to Armour, she had several complaints possibly related to hypothyroidism: Fatigue, constipation, cold intolerance.  We initially started 60 mg of Armour daily, however, we had to increase the dose to 90 mg daily 09/2018.  We then reduced the dose. - latest thyroid labs reviewed with pt. >> normal: Lab  Results  Component Value Date   TSH 2.51 12/10/2021  - she continues on Armour 90 mg 5/7 days and 50 mg 2/7 days and also selenium 200 mcg daily. - pt feels good on this dose. - we discussed about taking the thyroid hormone every day, with water, >30 minutes before breakfast, separated by >4 hours from acid reflux medications, calcium, iron, multivitamins. Pt. is taking it correctly. - will check thyroid tests today: TSH, free T3 and free T4 - If labs are abnormal, she will need to return for repeat TFTs in 1.5 months  2.   Vitamin D insufficiency -She is on ~4000 units vitamin D  daily (10,000 units 3x a week) -inconsistently -Latest level was normal at last visit: Lab Results  Component Value Date   VD25OH 54.20 12/10/2021  -We can recheck the level today  3.  History of low B12 vitamin -Previously on 10,000 units vitamin B12, currently on 5000 units daily -but she only restarted this 3 days ago.  She had dizziness on the higher dose. -At last visit, the level was only slightly high, at 1134 -We can recheck the level today  Needs refills of Armour.  Component     Latest Ref Rng 01/01/2023  Triiodothyronine,Free,Serum     2.3 - 4.2 pg/mL 2.8   T4,Free(Direct)     0.60 - 1.60 ng/dL 7.82 (L)   TSH     9.56 - 5.50 uIU/mL 4.37   VITD     30.00 - 100.00 ng/mL 34.17   Vitamin B12     211 - 911 pg/mL 1,126 (H)   Labs at goal with the exception of a slightly high B12 level. Will advise her to take B12 but reduce the dose to 1000 mcg daily.  Carlus Pavlov, MD PhD [facility]

## 2023-01-01 NOTE — Patient Instructions (Signed)
Please continue Armour 90 mg 5/7 days, 60 mg 2/7 days..  Take the thyroid hormone every day, with water, at least 30 minutes before breakfast, separated by at least 4 hours from: - acid reflux medications - calcium - iron - multivitamins  Please stop at the lab.  Continue: - B12 5000 mcg daily - vitamin D 4000 units daily  Please come for another appointment in 1 year 

## 2023-01-02 LAB — T4, FREE: Free T4: 0.59 ng/dL — ABNORMAL LOW (ref 0.60–1.60)

## 2023-01-02 LAB — VITAMIN D 25 HYDROXY (VIT D DEFICIENCY, FRACTURES): VITD: 34.17 ng/mL (ref 30.00–100.00)

## 2023-01-02 LAB — TSH: TSH: 4.37 u[IU]/mL (ref 0.35–5.50)

## 2023-01-02 LAB — T3, FREE: T3, Free: 2.8 pg/mL (ref 2.3–4.2)

## 2023-01-02 LAB — VITAMIN B12: Vitamin B-12: 1126 pg/mL — ABNORMAL HIGH (ref 211–911)

## 2023-01-02 MED ORDER — ARMOUR THYROID 90 MG PO TABS: 90.0000 mg | ORAL_TABLET | Freq: Every day | ORAL | 3 refills | Status: DC

## 2023-01-02 MED ORDER — ARMOUR THYROID 60 MG PO TABS: 60.0000 mg | ORAL_TABLET | Freq: Every day | ORAL | 3 refills | Status: DC

## 2023-01-02 NOTE — Addendum Note (Signed)
Addended by: Carlus Pavlov on: 01/02/2023 12:20 PM   Modules accepted: Orders

## 2023-09-16 ENCOUNTER — Other Ambulatory Visit: Payer: Self-pay | Admitting: Internal Medicine

## 2023-09-16 DIAGNOSIS — E063 Autoimmune thyroiditis: Secondary | ICD-10-CM

## 2023-10-15 ENCOUNTER — Other Ambulatory Visit: Payer: Self-pay | Admitting: Internal Medicine

## 2023-10-15 DIAGNOSIS — E063 Autoimmune thyroiditis: Secondary | ICD-10-CM

## 2024-01-14 ENCOUNTER — Ambulatory Visit (INDEPENDENT_AMBULATORY_CARE_PROVIDER_SITE_OTHER): Payer: 59 | Admitting: Internal Medicine

## 2024-01-14 ENCOUNTER — Encounter: Payer: Self-pay | Admitting: Internal Medicine

## 2024-01-14 VITALS — BP 120/70 | HR 72 | Ht 67.75 in | Wt 198.4 lb

## 2024-01-14 DIAGNOSIS — E063 Autoimmune thyroiditis: Secondary | ICD-10-CM

## 2024-01-14 DIAGNOSIS — E559 Vitamin D deficiency, unspecified: Secondary | ICD-10-CM

## 2024-01-14 DIAGNOSIS — E538 Deficiency of other specified B group vitamins: Secondary | ICD-10-CM | POA: Diagnosis not present

## 2024-01-14 MED ORDER — ARMOUR THYROID 90 MG PO TABS: 90.0000 mg | ORAL_TABLET | Freq: Every day | ORAL | 3 refills | Status: DC

## 2024-01-14 MED ORDER — ARMOUR THYROID 60 MG PO TABS: 60.0000 mg | ORAL_TABLET | Freq: Every day | ORAL | 3 refills | Status: DC

## 2024-01-14 NOTE — Patient Instructions (Addendum)
 Please continue Armour 90 mg 5/7 days, 60 mg 2/7 days..  Take the thyroid  hormone every day, with water, at least 30 minutes before breakfast, separated by at least 4 hours from: - acid reflux medications - calcium  - iron - multivitamins  Please stop at the lab.  Continue: - B12 5000 mcg every 2 weeks - vitamin D  4000 units daily  Please come for another appointment in 1 year

## 2024-01-14 NOTE — Progress Notes (Signed)
 Patient ID: Kelly Tran, female   DOB: 03/13/1966, 58 y.o.   MRN: [id number]    HPI  Kelly Tran is a 58 y.o.-year-old female, initially referred by her PCP, Kelly Antu, FNP, returning for follow-up for Hashimoto's hypothyroidism.  He previously saw Dr. Von, last visit 01/2018.  I first saw the patient in 07/2018.  Last visit with me 1 year ago.  Interim history: She gained 15 lbs before last visit, but weight is stable since then.  She was previously on a keto diet and lost 20 pounds in 2 to 3 months.  She did not feel well and relaxed her diet afterwards.  Weight is approximately stable since last visit. She continues to have constipation, which is longstanding for her.  Also, she has hair loss for which she restarted biotin.  Reviewed history: Pt. has been dx with hypothyroidism in 11/2011.  She was initially started on levothyroxine , but she did not tolerate this well.  She then was changed to Armour in 07/2012 and 45 mg daily, then increase to 60 mg daily in 06/2015.  However, she developed palpitations (stopped after stopping amitriptyline ).  Her medication was changed to levothyroxine  50 mcg + liothyronine  10 mg daily in 02/2017.  This was increased to 8 tablets of levothyroxine  and 14 tablets of liothyronine  a week in 05/2018.  At that time, TSH was 3.77.  However, she was complaining of fatigue, cold intolerance, hair loss, and constipation and we switched to Armour thyroid .  Her symptoms improved on this, with the exception of hair loss.  She is on Armour 90 mg 5/7 days and 60 mg 2/7 days.  She takes this: - in am - fasting - at least 30 min from b'fast - no iron, PPIs - + calcium , multivitamins - at night - on Biotin 10,000 mcg daily in [employer] supplement  - off for 3 days Occasional Ashwaghanda, on Mg - for sleep.  Reviewed her TFTs: Lab Results  Component Value Date   TSH 4.37 01/01/2023   TSH 2.51 12/10/2021   TSH 2.34 06/19/2021   TSH 0.26 (L)  12/14/2020   TSH 0.11 (L) 09/17/2020   TSH 0.59 12/15/2019   TSH 0.27 (L) 06/20/2019   TSH 1.26 11/10/2018   TSH 6.91 (H) 09/21/2018   TSH 7.30 (H) 08/06/2018   FREET4 0.59 (L) 01/01/2023   FREET4 0.68 12/10/2021   FREET4 0.63 06/19/2021   FREET4 0.82 12/14/2020   FREET4 0.66 09/17/2020   FREET4 0.69 12/15/2019   FREET4 0.80 06/20/2019   FREET4 0.56 (L) 11/10/2018   FREET4 0.57 (L) 09/21/2018   FREET4 0.58 (L) 08/06/2018   T3FREE 2.8 01/01/2023   T3FREE 4.0 12/10/2021   T3FREE 4.1 06/19/2021   T3FREE 4.7 (H) 12/14/2020   T3FREE 4.4 (H) 12/15/2019   T3FREE 4.7 (H) 06/20/2019   T3FREE 2.8 11/10/2018   T3FREE 3.8 09/21/2018   T3FREE 3.3 08/06/2018   T3FREE 3.7 06/22/2018   Her TPO antibodies were elevated: Component     Latest Ref Rng & Units 12/14/2020  Thyroglobulin Ab     < or = 1 IU/mL <1  Thyroperoxidase Ab SerPl-aCnc     <9 IU/mL 481 (H)   Component     Latest Ref Rng & Units 06/22/2018 11/10/2018  Thyroglobulin Ab     < or = 1 IU/mL <1   Thyroperoxidase Ab SerPl-aCnc     <9 IU/mL 822 (H) 770 (H)   We started selenium 200 mcg daily  in 07/2018.  She also started to improve her diet to reduce gluten, nuts, dairy.   Pt denies: - feeling nodules in neck - hoarseness - dysphagia - choking  She has no FH of thyroid  disorders.No FH of thyroid  cancer. No h/o radiation tx to head or neck. No herbal supplements. No recent steroids use.   She has a history of: -low vitamin B-12 (on liquid b12  -dose decreased from 10,000 to 5000 units daily in 12/2019 >> at last visit I advised her to reduce the dose to 1000 mcg daily, but takes 5000 mcg every 2 weeks -Vitamin D  deficiency (on vitamin D  4000 units daily - not every day)  Lab Results  Component Value Date   VITAMINB12 1,126 (H) 01/01/2023   VITAMINB12 1,134 (H) 12/10/2021   VITAMINB12 350 12/14/2020   VITAMINB12 >1526 (H) 12/15/2019   VITAMINB12 357 05/21/2018   VITAMINB12 220 02/02/2018   VITAMINB12 392  07/15/2016   VITAMINB12 452 06/28/2015   VITAMINB12 295 08/08/2008   Lab Results  Component Value Date   VD25OH 34.17 01/01/2023   VD25OH 54.20 12/10/2021   VD25OH 47.5 12/14/2020   VD25OH 60.9 12/15/2019   VD25OH 32.55 05/21/2018   VD25OH 22.45 (L) 02/02/2018   VD25OH 25.22 (L) 07/15/2016   VD25OH 23.18 (L) 11/20/2015   VD25OH 38.23 09/26/2015   VD25OH 22.84 (L) 06/28/2015   Pt. also has a history of GERD (drinks water + baking soda), multiple pregnancy losses.  ROS: + see HPI  I reviewed pt's medications, allergies, PMH, social hx, family hx, and changes were documented in the history of present illness. Otherwise, unchanged from my initial visit note.  Past Medical History:  Diagnosis Date   Allergy    External hemorrhoid 04/26/2013   Family history of breast cancer 01/02/2022   G E R D 02/22/2007   Genital warts    GERD (gastroesophageal reflux disease) 11/20/2015   Hemorrhoids, internal 01/31/2011   Hypothyroidism    Migraine    Migraine 03/23/2018   Past Surgical History:  Procedure Laterality Date   APPENDECTOMY     DILATION AND CURETTAGE OF UTERUS     miscarriage     x6--required d and c   Social History   Socioeconomic History   Marital status: Married    Spouse name: Not on file   Number of children: 0   Years of education: Not on file   Highest education level: Not on file  Occupational History    Employer: [employer]    Comment: Arboriculturist  Tobacco Use   Smoking status: Former    Current packs/day: 0.00    Types: Cigarettes    Start date: 01/30/1966    Quit date: 01/31/1996    Years since quitting: 27.9   Smokeless tobacco: Never  Vaping Use   Vaping status: Never Used  Substance and Sexual Activity   Alcohol use: Yes    Alcohol/week: 0.0 standard drinks of alcohol    Comment: occ   Drug use: No   Sexual activity: Yes    Partners: Male    Birth control/protection: Post-menopausal  Other Topics Concern   Not on file   Social History Narrative   Not on file   Social Drivers of Health   Financial Resource Strain: Not on file  Food Insecurity: Not on file  Transportation Needs: Not on file  Physical Activity: Not on file  Stress: Not on file  Social Connections: Not on file  Intimate Partner Violence:  Not on file   Current Outpatient Medications on File Prior to Visit  Medication Sig Dispense Refill   ARMOUR THYROID  60 MG tablet TAKE 1 TABLET (60 MG TOTAL) BY MOUTH DAILY BEFORE BREAKFAST. TAKE BEFORE BREAKFAST 2 OUT OF 7 DAYS. 8 tablet 4   ARMOUR THYROID  90 MG tablet Take 1 tablet (90 mg total) by mouth daily. Take before breakfast 5 out of 7 days. 70 tablet 3   ASHWAGANDHA PO Take by mouth. (Patient not taking: Reported on 01/01/2023)     CALCIUM  PO Take 1 tablet daily with lunch by mouth.     Cholecalciferol (VITAMIN D  PO) Take 500 Units daily by mouth.     Cyanocobalamin  (VITAMIN B-12 PO) Take 1 tablet daily with lunch by mouth.     cyclobenzaprine  (FLEXERIL ) 10 MG tablet Take 1 tablet (10 mg total) by mouth 3 (three) times daily as needed for muscle spasms. 30 tablet 0   fluticasone  (FLONASE ) 50 MCG/ACT nasal spray Place 2 sprays into both nostrils daily. 16 g 0   loratadine  (CLARITIN ) 10 MG tablet Take 10 mg by mouth daily.     MAGNESIUM PO Take by mouth.     meloxicam  (MOBIC ) 15 MG tablet Take one oral daily for 1 wk with food then as needed pain (Patient not taking: Reported on 01/01/2023) 30 tablet 0   Multiple Vitamin (MULTIVITAMIN WITH MINERALS) TABS tablet Take 1 tablet daily with lunch by mouth.     Pyridoxine  HCl (VITAMIN B-6 PO) Take 1 tablet daily with lunch by mouth. (Patient not taking: Reported on 01/01/2023)     valACYclovir  (VALTREX ) 1000 MG tablet Take 1,000 mg by mouth 3 (three) times daily. (Patient not taking: Reported on 01/01/2023)     No current facility-administered medications on file prior to visit.   No Known Allergies Family History  Problem Relation Age of Onset    Stroke Mother    Arthritis Mother    Hypertension Father    Arthritis Father    Hyperlipidemia Father    Other Brother        car accident   Arthritis Maternal Grandmother    Arthritis Maternal Grandfather    Arthritis Paternal Grandmother    Arthritis Paternal Grandfather    Ovarian cancer Maternal Aunt    Breast cancer Maternal Aunt    Lung cancer Maternal Aunt    Diabetes Neg Hx        family   Stomach cancer Neg Hx    Colon cancer Neg Hx    Pancreatic cancer Neg Hx    Esophageal cancer Neg Hx    Rectal cancer Neg Hx    PE: BP 120/70   Pulse 72   Ht 5' 7.75 (1.721 m)   Wt 198 lb 6.4 oz (90 kg)   LMP 03/11/2013   SpO2 97%   BMI 30.39 kg/m  Wt Readings from Last 3 Encounters:  01/14/24 198 lb 6.4 oz (90 kg)  01/01/23 196 lb 9.6 oz (89.2 kg)  09/15/22 199 lb 8 oz (90.5 kg)   Constitutional: overweight, in NAD Eyes: EOMI, no exophthalmos ENT: no neck masses palpated, no cervical lymphadenopathy Cardiovascular: RRR, No MRG Respiratory: CTA B Musculoskeletal: no deformities Skin: no rashes Neurological: no tremor with outstretched hands  ASSESSMENT: 1. Hypothyroidism -Due to Hashimoto's thyroiditis  2.  Vitamin D  insufficiency  3.  History of low B12 vitamin  PLAN:  1. Patient with longstanding Hashimoto's hypothyroidism, previously on levothyroxine  and liothyronine , currently on Armour, on which  she feels better.  Before switching to Armour, she had several complaints possibly related to hypothyroidism: Fatigue, constipation, cold intolerance.  We initially started 60 mg of Armour daily, however, we had to increase the dose to 90 mg daily 09/2018.  We then reduced the dose. - latest thyroid  labs reviewed with pt. >> normal: Lab Results  Component Value Date   TSH 4.37 01/01/2023  - she continues on Armour 90 mg 5/7 days and 60 mg 2/7 days - pt feels good on this dose. - we discussed about taking the thyroid  hormone every day, with water, >30 minutes before  breakfast, separated by >4 hours from acid reflux medications, calcium , iron, multivitamins. Pt. is taking it correctly. - will check thyroid  tests in approximately 10 days, since she is on high-dose biotin and she only stopped it 3 days ago: TSH and fT4 - If labs are abnormal, she will need to return for repeat TFTs in 1.5 months - OTW, I will see her back in a year  2.   Vitamin D  insufficiency - She takes 10,000 units 3 times a week (approximately 4000 units daily), but inconsistently  - Vitamin D  level was normal: Lab Results  Component Value Date   VD25OH 34.17 01/01/2023  - Will recheck the level with the next lab draw  3.  History of low B12 vitamin - Previously on 10,000 units vitamin B12, on 5000 units daily started 3 days prior to last visit.  She had dizziness on the higher dose. - At last visit, vitamin B12 was only slightly high, at 1126 so I advised her to reduce the dose to 1000 mcg daily.  She is not taking a 5000 mcg supplement approximately every 2 weeks, which we will continue.   - Will recheck the level today  Orders Placed This Encounter  Procedures   TSH   T4, free   VITAMIN D  25 Hydroxy (Vit-D Deficiency, Fractures)   Vitamin B12   Since she is running out of Armour, I did refill this for her today.  Lela Fendt, MD PhD [facility]

## 2024-01-22 ENCOUNTER — Other Ambulatory Visit: Payer: Self-pay | Admitting: Internal Medicine

## 2024-01-25 ENCOUNTER — Other Ambulatory Visit

## 2024-01-26 ENCOUNTER — Other Ambulatory Visit

## 2024-01-26 LAB — VITAMIN B12: Vitamin B-12: 458 pg/mL (ref 200–1100)

## 2024-01-26 LAB — TSH: TSH: 3.48 m[IU]/L (ref 0.40–4.50)

## 2024-01-26 LAB — VITAMIN D 25 HYDROXY (VIT D DEFICIENCY, FRACTURES): Vit D, 25-Hydroxy: 36 ng/mL (ref 30–100)

## 2024-01-26 LAB — T4, FREE: Free T4: 0.9 ng/dL (ref 0.8–1.8)

## 2024-01-27 ENCOUNTER — Ambulatory Visit: Payer: Self-pay | Admitting: Internal Medicine

## 2024-03-12 ENCOUNTER — Other Ambulatory Visit: Payer: Self-pay | Admitting: Internal Medicine

## 2024-03-12 DIAGNOSIS — E063 Autoimmune thyroiditis: Secondary | ICD-10-CM

## 2024-03-30 ENCOUNTER — Ambulatory Visit: Payer: Self-pay

## 2024-03-30 NOTE — Telephone Encounter (Signed)
 FYI Only or Action Required?: FYI only for provider.  Patient was last seen in primary care on 09/15/2022 by Wendee Lynwood HERO, NP.  Called Nurse Triage reporting Shoulder Pain.  Symptoms began several weeks ago.  Interventions attempted: OTC medications: ibuprofen, Prescription medications: flexeril , Ice/heat application, and Other: tens unit.  Symptoms are: gradually worsening.  Triage Disposition: See Physician Within 24 Hours  Patient/caregiver understands and will follow disposition?: Yes    Copied from CRM #[id number]. Topic: Clinical - Red Word Triage >> Mar 30, 2024  9:03 AM Carlyon D wrote: Red Word that prompted transfer to Nurse Triage: pinched nerve on right shoulder, pain in shoulder area,pain in the  forearm feels like its going to bust. Tingling and numbness finger all on the right side Reason for Disposition  Numbness (i.e., loss of sensation) in hand or fingers  Answer Assessment - Initial Assessment Questions Patient says the pain started between shoulder blades on the Thursday while on vacation. She says she was moving around heavy luggage and felt the discomfort the next day. She says she went to the chiropractor last Monday and Tuesday, which helped with the pain between shoulder blades. She says her middle and index finger are feeling tingling/numb and it was her thumb, but that is feeling a little better. She says her forearm feels like it's getting ready to bust wide open, not swollen, but very painful. She says the only relief if to hold her arm up in the air. While she was on vacation, she says she took muscle relaxers from a few years ago, flexeril , didn't help the discomfort. She says she's been icing it, using a tens unit that she has and taking ibuprofen consistently, nothing is helping. Advised Same Day Ortho at [facility] today, but couldn't get the appointment scheduled. She says to go ahead and schedule tomorrow with Ginger and if she gets worse she will just go  to that UC.    1. ONSET: When did the pain start?     Yesterday (down from neck across the shoulder 2. LOCATION: Where is the pain located?     Below neck across right shoulder blade 3. PAIN: How bad is the pain? (Scale 1-10; or mild, moderate, severe)     8-9 4. WORK OR EXERCISE: Has there been any recent work or exercise that involved this part of the body?     Only thing is last week moving around heavy luggage on vacation 5. CAUSE: What do you think is causing the shoulder pain?     Possible moving heavy luggage 6. OTHER SYMPTOMS: Do you have any other symptoms? (e.g., neck pain, swelling, rash, fever, numbness, weakness)     Lymph node under right arm swollen, numbness down arm, very little strength in right hand  Protocols used: Shoulder Pain-A-AH

## 2024-03-30 NOTE — Telephone Encounter (Signed)
 NOTED

## 2024-03-31 ENCOUNTER — Ambulatory Visit (INDEPENDENT_AMBULATORY_CARE_PROVIDER_SITE_OTHER): Admitting: Family

## 2024-03-31 ENCOUNTER — Ambulatory Visit: Admitting: Family

## 2024-03-31 ENCOUNTER — Encounter: Payer: Self-pay | Admitting: Family

## 2024-03-31 VITALS — BP 126/64 | HR 87 | Temp 98.6°F | Ht 67.75 in

## 2024-03-31 DIAGNOSIS — M62838 Other muscle spasm: Secondary | ICD-10-CM | POA: Diagnosis not present

## 2024-03-31 DIAGNOSIS — M25511 Pain in right shoulder: Secondary | ICD-10-CM | POA: Diagnosis not present

## 2024-03-31 DIAGNOSIS — M5412 Radiculopathy, cervical region: Secondary | ICD-10-CM

## 2024-03-31 DIAGNOSIS — R590 Localized enlarged lymph nodes: Secondary | ICD-10-CM | POA: Insufficient documentation

## 2024-03-31 MED ORDER — AZITHROMYCIN 250 MG PO TABS: ORAL_TABLET | ORAL | 0 refills | Status: AC

## 2024-03-31 MED ORDER — TIZANIDINE HCL 4 MG PO TABS: ORAL_TABLET | ORAL | 0 refills | Status: DC

## 2024-03-31 MED ORDER — PREDNISONE 10 MG (21) PO TBPK: ORAL_TABLET | ORAL | 0 refills | Status: DC

## 2024-03-31 NOTE — Progress Notes (Signed)
 Established Patient Office Visit  Subjective:      CC:  Chief Complaint  Patient presents with   Shoulder Pain    Right shoulder pain 12 days. Denies any injury. Pain increased over time. Pain level right now 8/10    HPI: NAYSA PUSKAS is a 58 y.o. female presenting on 03/31/2024 for Shoulder Pain (Right shoulder pain 12 days. Denies any injury. Pain increased over time. Pain level right now 8/10) .  Discussed the use of AI scribe software for clinical note transcription with the patient, who gave verbal consent to proceed.  History of Present Illness ELOISA CHOKSHI is a 58 year old female who presents with right shoulder and arm pain.  She woke up with right shoulder pain a week ago after lifting luggage for a vacation. The pain started in the shoulder and radiated down the arm, with significant discomfort in the forearm, described as feeling like it is going to 'explode'.  She has been using icy hot packs and Flexeril , but these have not provided significant relief. She also takes ibuprofen and Tylenol , which help her sleep but do not alleviate the pain when standing. The pain is most severe when she is upright and improves when lying flat or with her arm over her head.  She describes numbness and tingling initially in her thumb and index finger, which has progressed to her index and middle fingers, accompanied by a weak grip. She has been seeing a chiropractor for neck decompression and laser therapy, but no x-rays have been performed.  She notes tenderness in the forearm and describes the pain as 'electric', starting from the neck and radiating to the fingers. No elbow involvement is reported, but there is tenderness in the forearm muscles.  Additionally, she noticed an enlarged and tender lymph node under her right arm about a week ago, which was initially uncomfortable but has since improved. She had COVID-19 approximately four to five weeks ago but denies any current  systemic symptoms such as fever or malaise.  She reports some congestion, which she attributes to allergy season. She has been taking zinc and vitamin C supplements to support her immune system.         Social history:  Relevant past medical, surgical, family and social history reviewed and updated as indicated. Interim medical history since our last visit reviewed.  Allergies and medications reviewed and updated.  DATA REVIEWED: CHART IN EPIC     ROS: Negative unless specifically indicated above in HPI.    Current Outpatient Medications:    ARMOUR THYROID  60 MG tablet, TAKE 1 TABLET (60 MG TOTAL) BY MOUTH DAILY BEFORE BREAKFAST. TAKE BEFORE BREAKFAST 2 OUT OF 7 DAYS., Disp: 8 tablet, Rfl: 4   ARMOUR THYROID  90 MG tablet, TAKE 1 TABLET BY MOUTH DAILY. TAKE BEFORE BREAKFAST 5 OUT OF 7 DAYS. (NOT CONTRACTED W/[facility]), Disp: 20 tablet, Rfl: 13   ascorbic acid (VITAMIN C) 100 MG tablet, Take 100 mg by mouth., Disp: , Rfl:    ASHWAGANDHA PO, Take by mouth., Disp: , Rfl:    CALCIUM  PO, Take 1 tablet daily with lunch by mouth., Disp: , Rfl:    Cholecalciferol (VITAMIN D  PO), Take 500 Units daily by mouth., Disp: , Rfl:    Cyanocobalamin  (VITAMIN B-12 PO), Take 1 tablet daily with lunch by mouth., Disp: , Rfl:    cyclobenzaprine  (FLEXERIL ) 10 MG tablet, Take 1 tablet (10 mg total) by mouth 3 (three) times daily as needed for muscle  spasms., Disp: 30 tablet, Rfl: 0   fluticasone  (FLONASE ) 50 MCG/ACT nasal spray, Place 2 sprays into both nostrils daily., Disp: 16 g, Rfl: 0   loratadine  (CLARITIN ) 10 MG tablet, Take 10 mg by mouth daily., Disp: , Rfl:    MAGNESIUM PO, Take by mouth., Disp: , Rfl:    Multiple Vitamin (MULTIVITAMIN WITH MINERALS) TABS tablet, Take 1 tablet daily with lunch by mouth., Disp: , Rfl:    Pyridoxine  HCl (VITAMIN B-6 PO), Take 1 tablet daily with lunch by mouth., Disp: , Rfl:         Objective:        BP 126/64   Pulse 87   Temp 98.6 F (37 C) (Temporal)    Ht 5' 7.75 (1.721 m)   LMP 03/11/2013   SpO2 98%   BMI 30.39 kg/m   Physical Exam NECK: Right axillary lymph node enlarged and tender. Neck compression test negative for radicular pain. Neck flexion causes mild pulling sensation. Neck extension limited. Neck lateral flexion full to left. Right lateral neck flexion painful. BREAST: Breasts without nipple changes. MUSCULOSKELETAL: Right shoulder tender on palpation. Right elbow tender on palpation.  Wt Readings from Last 3 Encounters:  01/14/24 198 lb 6.4 oz (90 kg)  01/01/23 196 lb 9.6 oz (89.2 kg)  09/15/22 199 lb 8 oz (90.5 kg)    Physical Exam Neck:     Comments: Palpable forearm tenderness  Musculoskeletal:     Cervical back: No rigidity. Pain with movement and muscular tenderness present. No spinous process tenderness. Normal range of motion.          Results   Assessment & Plan:   Assessment and Plan Assessment & Plan Right cervical radiculopathy with right forearm pain and paresthesia Acute right cervical radiculopathy with associated right forearm pain and paresthesia, likely due to nerve compression or inflammation. Symptoms include numbness and tingling in the index and middle fingers, and weakness in grip strength. Pain is exacerbated by movement and relieved by rest. Differential includes muscular spasm and possible tendon involvement. Conservative management is preferred initially. - Prescribe prednisone  for inflammation reduction, with reassurance that short-term use avoids common long-term side effects like weight gain. - Prescribe tizanidine  as a muscle relaxant for nighttime use, with advice to test its sedative effects at home first. - Advise use of lidocaine patches for localized pain relief. - Recommend Voltaren  gel for neck application. - Advise gentle neck exercises to maintain mobility. - Recommend heat application to the muscle area for relief. - Advise rest and avoidance of heavy lifting with the  right hand. - Consider x-ray if no improvement with conservative measures.  Right lateral epicondylitis (possible) Possible right lateral epicondylitis suggested by tenderness and tightness in the forearm. Symptoms may be secondary to or exacerbated by cervical radiculopathy. Conservative management is advised. - Advise rest and avoidance of repetitive movements with the right hand. - Consider use of a brace for support if symptoms persist.  Right axillary lymphadenopathy, improving Right axillary lymphadenopathy with tenderness but improving over the past week. No associated systemic symptoms such as fever or malaise. Recent COVID-19 infection. Differential includes inflammatory response or possible folliculitis. Conservative management with monitoring is advised. - Prescribe a Z-Pak (azithromycin ) to cover possible folliculitis, with reassurance that it is unlikely to be harmful. - Advise heat application to the area to aid lymphatic drainage. - Monitor for changes in size or tenderness of the lymph node.       Return in about 4 months (  around 07/31/2024) for f/u CPE.     Ginger Patrick, MSN, APRN, FNP-C [facility]

## 2024-04-12 ENCOUNTER — Ambulatory Visit: Payer: Self-pay

## 2024-04-12 NOTE — Telephone Encounter (Signed)
 FYI Only or Action Required?: Action required by provider: medication refill request and referral request.  Patient was last seen in primary care on 03/31/2024 by Corwin Antu, FNP.  Called Nurse Triage reporting Arm Pain.  Symptoms began today.  Interventions attempted: Nothing.  Symptoms are: unchanged. States unchanged and PCP mentioned a referral to Ortho. Please advise pt.  Triage Disposition: See PCP When Office is Open (Within 3 Days)  Patient/caregiver understands and will follow disposition?: No, wishes to speak with PCP   Copied from CRM #[id number]. Topic: Clinical - Red Word Triage >> Apr 12, 2024  9:22 AM Donna BRAVO wrote: Red Word that prompted transfer to Nurse Triage: patient starting 03/19/24 has pinched nerve in right arm, causing tingling, pain and feels like it's going to sleep, right back by shoulder blade down back of arm to elbow to thumb and index figner Reason for Disposition  [1] Weakness or numbness in hand or fingers AND [2] present > 2 weeks  Answer Assessment - Initial Assessment Questions 1. ONSET: When did the pain start?     weeks 2. LOCATION: Where is the pain located?     Right arm, shoulder 3. PAIN: How bad is the pain? (Scale 0-10; or none, mild, moderate, severe)     3 4. WORK OR EXERCISE: Has there been any recent work or exercise that involved this part of the body?     no 5. CAUSE: What do you think is causing the arm pain?     unsure 6. OTHER SYMPTOMS: Do you have any other symptoms? (e.g., neck pain, swelling, rash, fever, numbness, weakness)     no 7. PREGNANCY: Is there any chance you are pregnant? When was your last menstrual period?     no  Protocols used: Arm Pain-A-AH

## 2024-04-15 NOTE — Telephone Encounter (Signed)
 Spoke with pt and she has made an appointment with Tabitha already to address.

## 2024-04-18 ENCOUNTER — Encounter: Payer: Self-pay | Admitting: Family

## 2024-04-18 ENCOUNTER — Ambulatory Visit: Payer: Self-pay | Admitting: Family

## 2024-04-18 ENCOUNTER — Ambulatory Visit (INDEPENDENT_AMBULATORY_CARE_PROVIDER_SITE_OTHER): Admitting: Family

## 2024-04-18 ENCOUNTER — Ambulatory Visit
Admission: RE | Admit: 2024-04-18 | Discharge: 2024-04-18 | Disposition: A | Source: Ambulatory Visit | Attending: Family

## 2024-04-18 VITALS — BP 150/86 | HR 111 | Temp 98.4°F | Ht 67.75 in | Wt 198.6 lb

## 2024-04-18 DIAGNOSIS — M5412 Radiculopathy, cervical region: Secondary | ICD-10-CM | POA: Diagnosis not present

## 2024-04-18 DIAGNOSIS — Z1231 Encounter for screening mammogram for malignant neoplasm of breast: Secondary | ICD-10-CM

## 2024-04-18 DIAGNOSIS — R202 Paresthesia of skin: Secondary | ICD-10-CM

## 2024-04-18 DIAGNOSIS — Z823 Family history of stroke: Secondary | ICD-10-CM

## 2024-04-18 DIAGNOSIS — M4722 Other spondylosis with radiculopathy, cervical region: Secondary | ICD-10-CM

## 2024-04-18 NOTE — Progress Notes (Signed)
 Established Patient Office Visit  Subjective:      CC:  Chief Complaint  Patient presents with   Acute Visit    Arm pain/tingling    HPI: Kelly Tran is a 58 y.o. female presenting on 04/18/2024 for Acute Visit (Arm pain/tingling) .  Discussed the use of AI scribe software for clinical note transcription with the patient, who gave verbal consent to proceed.  History of Present Illness Kelly Tran is a 58 year old female who presents with persistent numbness and tingling in the right arm and fingers.  She experiences ongoing numbness and tingling primarily in the right index finger and occasionally in the thumb, described as 'like it's trying to go to sleep.' The pain in the arm is intermittent and less severe than before, with significant improvement in forearm pain following prednisone  treatment.  There is some improvement in grip strength, described as 'much better.' The tingling is constant, with varying severity. No significant pain with finger movement.  She uses tizanidine  nightly, which has been helpful, and previously tried Flexeril  without effect. A foam roller and a towel under her neck, as recommended by a chiropractor, have provided some relief.  Turning her head causes discomfort below the shoulder blade, and she experiences a 'sting' if jolted. Tightness in the forearm and elbow has improved significantly. Armpit discomfort has improved drastically after antibiotic treatment.  No significant shoulder pain, stating the shoulder is 'perfectly fine.' She has a family history of stroke, as her mother had a stroke at a relatively young age. She is concerned about her cardiovascular health due to recent deaths of friends from heart attacks and is interested in assessing her risk for blockages.         Social history:  Relevant past medical, surgical, family and social history reviewed and updated as indicated. Interim medical history since our last  visit reviewed.  Allergies and medications reviewed and updated.  DATA REVIEWED: CHART IN EPIC     ROS: Negative unless specifically indicated above in HPI.    Current Outpatient Medications:    ARMOUR THYROID  60 MG tablet, TAKE 1 TABLET (60 MG TOTAL) BY MOUTH DAILY BEFORE BREAKFAST. TAKE BEFORE BREAKFAST 2 OUT OF 7 DAYS., Disp: 8 tablet, Rfl: 4   ARMOUR THYROID  90 MG tablet, TAKE 1 TABLET BY MOUTH DAILY. TAKE BEFORE BREAKFAST 5 OUT OF 7 DAYS. (NOT CONTRACTED W/[facility]), Disp: 20 tablet, Rfl: 13   ascorbic acid (VITAMIN C) 100 MG tablet, Take 100 mg by mouth., Disp: , Rfl:    ASHWAGANDHA PO, Take by mouth., Disp: , Rfl:    CALCIUM  PO, Take 1 tablet daily with lunch by mouth., Disp: , Rfl:    Cholecalciferol (VITAMIN D  PO), Take 500 Units daily by mouth., Disp: , Rfl:    Cyanocobalamin  (VITAMIN B-12 PO), Take 1 tablet daily with lunch by mouth., Disp: , Rfl:    fluticasone  (FLONASE ) 50 MCG/ACT nasal spray, Place 2 sprays into both nostrils daily., Disp: 16 g, Rfl: 0   loratadine  (CLARITIN ) 10 MG tablet, Take 10 mg by mouth daily., Disp: , Rfl:    MAGNESIUM PO, Take by mouth., Disp: , Rfl:    Multiple Vitamin (MULTIVITAMIN WITH MINERALS) TABS tablet, Take 1 tablet daily with lunch by mouth., Disp: , Rfl:    Pyridoxine  HCl (VITAMIN B-6 PO), Take 1 tablet daily with lunch by mouth., Disp: , Rfl:    tiZANidine  (ZANAFLEX ) 4 MG tablet, 1/2 to one tablet po at bedtime prn muscle  spasm, Disp: 30 tablet, Rfl: 0        Objective:        BP (!) 150/86 (BP Location: Left Arm, Patient Position: Sitting, Cuff Size: Large)   Pulse (!) 111   Temp 98.4 F (36.9 C) (Temporal)   Ht 5' 7.75 (1.721 m)   Wt 198 lb 9.6 oz (90.1 kg)   LMP 03/11/2013   SpO2 98%   BMI 30.42 kg/m   Physical Exam VITALS: BP- 128/78 NECK: Pain below shoulder blade on forward flexion. Pain on right side flexion. Palpable pain in neck. Lymph node enlargement improved. No tenderness in lymph nodes.  Wt Readings from  Last 3 Encounters:  04/18/24 198 lb 9.6 oz (90.1 kg)  01/14/24 198 lb 6.4 oz (90 kg)  01/01/23 196 lb 9.6 oz (89.2 kg)    Physical Exam Vitals reviewed.  Constitutional:      Appearance: Normal appearance. She is obese.  Musculoskeletal:     Right shoulder: Normal range of motion.     Right forearm: Tenderness present.     Right hand: Normal range of motion. Normal strength.     Cervical back: No rigidity. Pain with movement and muscular tenderness (right posterior neck) present. No spinous process tenderness. Decreased range of motion (tenderness with flexion as well as external right rotation).  Lymphadenopathy:     Upper Body:     Right upper body: No axillary adenopathy.  Neurological:     Mental Status: She is alert.          Results   Assessment & Plan:   Assessment and Plan Assessment & Plan Right cervical radiculopathy Persistent numbness and tingling in the right index finger with some improvement in forearm pain, suggestive of cervical radiculopathy possibly originating from the neck despite lack of neck pain. Prednisone  provided significant relief, indicating a possible inflammatory component. Differential includes cervical radiculopathy versus carpal tunnel syndrome, though the latter is less likely due to symptom distribution. Conservative measures have provided partial relief, but symptoms persist, warranting further investigation. - Order neck x-ray to assess for arthritis, inflammation, compression, or enlarged disc. - Provide neck exercises to improve mobility and reduce tension. - Advise use of heating pad to reduce muscle tightness. - Recommend lidocaine patches (Salonpas) for localized pain relief, cautioning against concurrent use with heating pad. - Consider referral to neurosurgery if symptoms persist despite conservative measures. - Discuss potential use of gabapentin  for nerve pain if symptoms worsen.  Right axillary lymphadenopathy  (improved) Significant improvement in right axillary lymphadenopathy following antibiotic treatment. No tenderness upon examination, indicating resolution of inflammation.  Right lateral epicondylitis (improved) Improvement in right lateral epicondylitis symptoms, with reduced forearm tightness and pain.  General Health Maintenance Overdue for breast cancer screening. Discussed concerns about cardiovascular health due to recent deaths of friends from heart attacks. Interested in assessing cardiovascular risk. - Order mammogram at [facility] for breast cancer screening. - Order calcium  score CT scan to assess for coronary artery calcification and cardiovascular risk.  Follow-Up - Advise to communicate results of neck x-ray and any persistent symptoms for further management. - Plan for potential referral to physical therapy or neurosurgery based on x-ray findings and symptom persistence. - Schedule physical exam in February.        Return for f/u as scheduled for CPE in feb 2026.     Ginger Patrick, MSN, APRN, FNP-C [facility]

## 2024-04-18 NOTE — Patient Instructions (Signed)
 I have ordered imaging for you at [facility] outpatient diagnostic center. This order has been sent over for you electronically.  Please call [phone] to schedule this appointment.

## 2024-04-22 ENCOUNTER — Other Ambulatory Visit: Payer: Self-pay | Admitting: Family

## 2024-04-22 DIAGNOSIS — M62838 Other muscle spasm: Secondary | ICD-10-CM

## 2024-04-25 NOTE — Addendum Note (Signed)
 Addended by: ALBINO SHAVER C on: 04/25/2024 08:45 AM   Modules accepted: Orders

## 2024-05-10 ENCOUNTER — Other Ambulatory Visit: Payer: Self-pay

## 2024-05-10 ENCOUNTER — Ambulatory Visit: Attending: Family

## 2024-05-10 ENCOUNTER — Ambulatory Visit
Admission: RE | Admit: 2024-05-10 | Discharge: 2024-05-10 | Disposition: A | Source: Ambulatory Visit | Attending: Family

## 2024-05-10 DIAGNOSIS — M5412 Radiculopathy, cervical region: Secondary | ICD-10-CM | POA: Diagnosis present

## 2024-05-10 DIAGNOSIS — M79601 Pain in right arm: Secondary | ICD-10-CM | POA: Diagnosis present

## 2024-05-10 DIAGNOSIS — Z1231 Encounter for screening mammogram for malignant neoplasm of breast: Secondary | ICD-10-CM

## 2024-05-10 DIAGNOSIS — M4722 Other spondylosis with radiculopathy, cervical region: Secondary | ICD-10-CM | POA: Insufficient documentation

## 2024-05-10 NOTE — Therapy (Signed)
 OUTPATIENT PHYSICAL THERAPY EVALUATION   Patient Name: Kelly Tran MRN: [id number] DOB:1966/03/06, 58 y.o., female Today's Date: 05/10/2024  END OF SESSION:  Visit Number 1 Number of Visits 12 Date for PT re-eval 06/21/2024 Authorization Type [employer] Authorization Visit Number 1 Authorization Number of Visits 30 PT start time 0831 PT stop time 0910 PT time calculation (min) 39 min      Past Medical History:  Diagnosis Date   Allergy    External hemorrhoid 04/26/2013   Family history of breast cancer 01/02/2022   G E R D 02/22/2007   Genital warts    GERD (gastroesophageal reflux disease) 11/20/2015   Hemorrhoids, internal 01/31/2011   Hypothyroidism    Migraine    Migraine 03/23/2018   Past Surgical History:  Procedure Laterality Date   APPENDECTOMY     DILATION AND CURETTAGE OF UTERUS     miscarriage     x6--required d and c   Patient Active Problem List   Diagnosis Date Noted   Muscle spasm 03/31/2024   Cervical radiculopathy 03/31/2024   Strain of latissimus dorsi muscle 03/26/2022   GAD (generalized anxiety disorder) 02/21/2022   Other fatigue 02/21/2022   Post-menopausal 01/02/2022   Fibrocystic breast changes of both breasts 01/02/2022   Hypothyroidism due to Hashimoto's thyroiditis 10/26/2018   Seasonal allergies 09/25/2014    PCP: Corwin Antu, FNP   REFERRING PROVIDER: Corwin Antu, FNP   REFERRING DIAG:  [phone] (ICD-10-CM) - Spondylosis of cervical spine with radiculopathy    THERAPY DIAG:  Radiculopathy, cervical region  Pain in right arm  Rationale for Evaluation and Treatment: Rehabilitation  ONSET DATE: 03/20/2023  SUBJECTIVE:                                                                                                                                                                                                         SUBJECTIVE STATEMENT: Patient reports to PT d/t with numbness and tingling in her R arm and hand. She  did initially have pain for about 1-2 months, that has subsided. Only the numbness and tingling remains. Patient states that her symptoms initially began after lifting suitcases for a trip. She does feel that her desk setup is comfortable and appropriate   Hand dominance: Right  PERTINENT HISTORY:  Relevant PMHx includes hypothyroidism, migraine, lat. Dorsi mm strain (2023), GAD  PAIN:  Are you having pain?  Yes: NPRS scale: 3/10 Pain location: forearm and hand (digit 1-3)  Pain description: tingling, numbness  Aggravating factors: when I first stand up in the morning, after being  at my desk for a while  Relieving factors: lay down  PRECAUTIONS: None  RED FLAGS: None     WEIGHT BEARING RESTRICTIONS: No  FALLS:  Has patient fallen in last 6 months? 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  DIAGNOSTIC FINDINGS:  04/18/2024 Cervical Spine Xray   IMPRESSION: Moderately advanced spondylosis at C5-6 with mild left-sided foraminal narrowing. No acute osseous findings.  PATIENT SURVEYS:  QUICK DASH  Please rate your ability do the following activities in the last week by selecting the number below the appropriate response.   Activities Rating  Open a tight or new jar.  2 = Mild difficulty  Do heavy household chores (e.g., wash walls, floors). 2 = Mild difficulty  Carry a shopping bag or briefcase 2 = Mild difficulty  Wash your back. 1 = No difficulty   Use a knife to cut food. 1 = No difficulty   Recreational activities in which you take some force or impact through your arm, shoulder or hand (e.g.,  golf, hammering, tennis, etc.). 3 = Moderate difficulty  During the past week, to what extent has your arm, shoulder or hand problem interfered with your normal social activities with family, friends, neighbors or groups?  2 = Slightly  During the past week, were you limited in your work or other regular daily activities as a result of your arm, shoulder or hand problem? 2 = Slightly limited  Rate the severity of the following symptoms in the last week: Arm, Shoulder, or hand pain. 1 = none  Rate the severity of the following symptoms in the last week: Tingling (pins and needles) in your arm, shoulder or hand. 3 = Moderate  During the past week, how much difficulty have you had sleeping because of the pain in your arm, shoulder or hand?  1 = No difficulty   (A QuickDASH score may not be calculated if there is greater than 1 missing item.)  Quick Dash Disability/Symptom Score: 20.5  Minimally Clinically Important Difference (MCID): 15-20 points  Flavio, F. et al. (2013). Minimally clinically important difference of the disabilities of the arm, shoulder, and hand outcome measures (DASH) and its shortened version (Quick DASH). Journal of Orthopaedic & Sports Physical Therapy, 44(1), 30-39)   COGNITION: Overall cognitive status: Within functional limits for tasks assessed  SENSATION: Not tested  POSTURE: No Significant postural limitations  PALPATION: Mild R scapular dyskinesia near end-range flexion   CERVICAL ROM:   Active ROM A/PROM (deg) eval  Flexion WFL  Extension WFL  Right lateral flexion WFL  Left lateral flexion WFL  Right rotation WFL*  Left rotation WFL   (Blank rows = not tested)  *patient states Sometimes when I do this, I will feel some tingling R shoulder blade or R index finger  UPPER EXTREMITY MMT   MMT Right eval Left eval  Shoulder flexion 4+ 4  Shoulder extension    Shoulder abduction 4+ 4  Shoulder adduction    Shoulder extension     Shoulder internal rotation 4- 4  Shoulder external rotation 4 4  Elbow flexion 5 5  Elbow extension 5 5  Wrist flexion 4 4-  Wrist  extension 5 5  Wrist ulnar deviation    Wrist radial deviation    Wrist pronation    Wrist supination    Grip Strength (avg of 3 trials) 75# 71#   (Blank rows = not tested)   CERVICAL SPECIAL TESTS:  Upper limb tension test (ULTT):   Radial bias - negative  Median bias - positive    No change with L CS rotation    Small improvement with R CS rotaiton    Improved with wrist flexion     TREATMENT DATE:   [facility] Adult PT Treatment:                                                DATE: 05/10/2024   Initial evaluation: see patient education and home exercise program as noted below                                                                                                                                   PATIENT EDUCATION:  Education details: reviewed initial home exercise program; discussion of POC, prognosis and goals for skilled PT   Person educated: Patient Education method: Explanation, Demonstration, and Handouts Education comprehension: verbalized understanding, returned demonstration, and needs further education  HOME EXERCISE PROGRAM: Access Code: [email or web address] URL: [email or web address] Date: 05/10/2024 Prepared by: Marko Molt  Exercises - Supine Cervical Retraction with Towel  - 2 x daily - 7 x weekly - 2 sets - 10 reps - 3 sec hold - Single Arm Serratus Punches in Supine with Dumbbell  - 2 x daily - 7 x weekly - 2 sets - 10 reps - 3 sec hold - Seated Shoulder Shrug Circles AROM Backward  - 2 x daily - 7 x weekly - 1 sets - 5-10 reps - Supine Cervical Rotation AROM on Flat Ball  - 2 x daily - 7 x weekly - 2 sets - 5 reps - Median Nerve Flossing - Tray  - 1 x daily - 7 x weekly - 2 sets - 10 reps  ASSESSMENT:  CLINICAL IMPRESSION: Rayyan is a 58 y.o. female who was seen today for physical therapy evaluation and  treatment for Neck Pain with Radicular Symptoms. At this time, she is experiences only R UE paresthesia. She is demonstrating mild R scapular dyskinsia with shoulder elevation, decreased BIL UE MMT scores, and positive R ULTT test with median nerve bias. 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 She requires skilled PT services at this time to address relevant deficits and return to PLOF.     OBJECTIVE IMPAIRMENTS: decreased activity tolerance, decreased strength, impaired UE functional use, improper body mechanics, and pain.   ACTIVITY LIMITATIONS: carrying, lifting, sleeping, and reach over head  PARTICIPATION LIMITATIONS: cleaning, shopping, community activity, and occupation  PERSONAL FACTORS: 1-2 comorbidities: Relevant PMHx includes hypothyroidism, migraine, lat. Dorsi mm strain (2023), GAD are also affecting patient's functional outcome.   REHAB POTENTIAL: Good  CLINICAL DECISION MAKING: Stable/uncomplicated  EVALUATION COMPLEXITY: Low   GOALS: Goals reviewed with patient? YES  SHORT TERM GOALS: Target date: 05/31/2024   Patient will be independent with initial home program at least 3 days/week.  Baseline: provided at eval Goal Status: INITIAL   2.  Patient will report absence of pain and radicular symptoms with end range CS R rotation  Baseline: see objective measures Goal Status: INITIAL     LONG TERM GOALS: Target date: 06/21/2024   Patient will report improved overall functional ability with Quick Dash score of 10 or less .  Baseline: 20.5 Goal Status: INITIAL    2.  Patient will demonstrate 4+/5 MMT with BIL UE strength testing Baseline: see objective measures  Goal status: INITIAL  3.  Patient will report ability to perform all occupational tasks without R UE symptoms at least 2 days over past week.  Baseline: symptoms with prolonged seated work which is 90% of her work day  Goal  status: INITIAL  4.  Patient will report ability to sleep through the night without R UE pain or paraesthesia upon waking.  Baseline: symptoms worse upon waking Goal status: INITIAL  5.  Patient will demonstrate ability to perform floor to waist lifting of at least 25# using appropriate body mechanics and with no more than minimal pain in order to safely perform normal daily/occupational tasks.   Goal Status: INITIAL   6.  Patient will demonstrate ability to perform overhead lifting of at least 10# using appropriate body mechanics and with no more than minimal pain in order to safely perform normal daily/occupational tasks.   Goal Status: INITIAL   PLAN:  PT FREQUENCY: 1-2x/week  PT DURATION: 6 weeks  PLANNED INTERVENTIONS: 02835- PT Re-evaluation, 97110-Therapeutic exercises, 97530- Therapeutic activity, [id number]- Neuromuscular re-education, 97535- Self Care, 02859- Manual therapy, G0283- Electrical stimulation (unattended), [phone]- Electrical stimulation (manual), [id number]- Traction (mechanical), 20560 (1-2 muscles), 20561 (3+ muscles)- Dry Needling, Patient/Family education, Taping, Joint mobilization, Spinal manipulation, Spinal mobilization, Cryotherapy, and Moist heat  PLAN FOR NEXT SESSION: address shoulder and periscapular strength/stability, median nerve glides, assess/address lifting mechanics when appropriate; consider manual therapy and modalities (including traction) in future sessions   Marko Molt, PT, DPT  05/14/2024 1:58 PM

## 2024-05-24 ENCOUNTER — Ambulatory Visit (HOSPITAL_COMMUNITY)

## 2024-05-30 ENCOUNTER — Ambulatory Visit (HOSPITAL_COMMUNITY)

## 2024-05-31 ENCOUNTER — Ambulatory Visit

## 2024-05-31 DIAGNOSIS — M5412 Radiculopathy, cervical region: Secondary | ICD-10-CM

## 2024-05-31 DIAGNOSIS — M79601 Pain in right arm: Secondary | ICD-10-CM

## 2024-05-31 NOTE — Therapy (Addendum)
 OUTPATIENT PHYSICAL THERAPY TREATMENT NOTE   Patient Name: Kelly Tran MRN: [id number] DOB:[birth year], 58 y.o., female Today's Date: 05/31/2024  END OF SESSION:  PT End of Session - 05/31/24 0829     Visit Number 2    Number of Visits 12    Date for Recertification  06/21/24    Authorization Type [employer]    Authorization - Visit Number 2    Authorization - Number of Visits 30    PT Start Time 0830    PT Stop Time 0909    PT Time Calculation (min) 39 min    Activity Tolerance Patient tolerated treatment well    Behavior During Therapy [facility] for tasks assessed/performed          Past Medical History:  Diagnosis Date   Allergy    External hemorrhoid 04/26/2013   Family history of breast cancer 01/02/2022   G E R D 02/22/2007   Genital warts    GERD (gastroesophageal reflux disease) 11/20/2015   Hemorrhoids, internal 01/31/2011   Hypothyroidism    Migraine    Migraine 03/23/2018   Past Surgical History:  Procedure Laterality Date   APPENDECTOMY     DILATION AND CURETTAGE OF UTERUS     miscarriage     x6--required d and c   Patient Active Problem List   Diagnosis Date Noted   Muscle spasm 03/31/2024   Cervical radiculopathy 03/31/2024   Strain of latissimus dorsi muscle 03/26/2022   GAD (generalized anxiety disorder) 02/21/2022   Other fatigue 02/21/2022   Post-menopausal 01/02/2022   Fibrocystic breast changes of both breasts 01/02/2022   Hypothyroidism due to Hashimoto's thyroiditis 10/26/2018   Seasonal allergies 09/25/2014    PCP: Corwin Antu, FNP   REFERRING PROVIDER: Corwin Antu, FNP   REFERRING DIAG:  [phone] (ICD-10-CM) - Spondylosis of cervical spine with radiculopathy    THERAPY DIAG:  Radiculopathy, cervical region  Pain in right arm  Rationale for Evaluation and Treatment: Rehabilitation  ONSET DATE: 03/20/2023  SUBJECTIVE:                                                                                                                                                                                                          SUBJECTIVE STATEMENT: Patient states that she is having no pain, just reports that she's been having numbness periodical throughout the day. States that it is only very minimal right now.  EVAL:Patient reports to PT d/t with numbness and tingling in her R arm and hand. She did initially have pain for about 1-2 months,  that has subsided. Only the numbness and tingling remains. Patient states that her symptoms initially began after lifting suitcases for a trip. She does feel that her desk setup is comfortable and appropriate   Hand dominance: Right  PERTINENT HISTORY:  Relevant PMHx includes hypothyroidism, migraine, lat. Dorsi mm strain (2023), GAD  PAIN:  Are you having pain?  Yes: NPRS scale: 3/10 Pain location: forearm and hand (digit 1-3)  Pain description: tingling, numbness  Aggravating factors: when I first stand up in the morning, after being at my desk for a while  Relieving factors: lay down  PRECAUTIONS: None  RED FLAGS: None     WEIGHT BEARING RESTRICTIONS: No  FALLS:  Has patient fallen in last 6 months? 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  DIAGNOSTIC FINDINGS:  04/18/2024 Cervical Spine Xray   IMPRESSION: Moderately advanced spondylosis at C5-6 with mild left-sided foraminal narrowing. No acute osseous findings.  PATIENT SURVEYS:  QUICK DASH  Please rate your ability do the following activities in the last week by selecting the number below the appropriate response.   Activities Rating   Open a tight or new jar.  2 = Mild difficulty  Do heavy household chores (e.g., wash walls, floors). 2 = Mild difficulty  Carry a shopping bag or briefcase 2 = Mild difficulty  Wash your back. 1 = No difficulty   Use a knife to cut food. 1 = No difficulty   Recreational activities in which you take some force or impact through your arm, shoulder or hand (e.g., golf, hammering, tennis, etc.). 3 = Moderate difficulty  During the past week, to what extent has your arm, shoulder or hand problem interfered with your normal social activities with family, friends, neighbors or groups?  2 = Slightly  During the past week, were you limited in your work or other regular daily activities as a result of your arm, shoulder or hand problem? 2 = Slightly limited  Rate the severity of the following symptoms in the last week: Arm, Shoulder, or hand pain. 1 = none  Rate the severity of the following symptoms in the last week: Tingling (pins and needles) in your arm, shoulder or hand. 3 = Moderate  During the past week, how much difficulty have you had sleeping because of the pain in your arm, shoulder or hand?  1 = No difficulty   (A QuickDASH score may not be calculated if there is greater than 1 missing item.)  Quick Dash Disability/Symptom Score: 20.5  Minimally Clinically Important Difference (MCID): 15-20 points  Flavio, F. et al. (2013). Minimally clinically important difference of the disabilities of the arm, shoulder, and hand outcome measures (DASH) and its shortened version (Quick DASH). Journal of Orthopaedic & Sports Physical Therapy, 44(1), 30-39)   COGNITION: Overall cognitive status: Within functional limits for tasks assessed  SENSATION: Not tested  POSTURE: No Significant postural limitations  PALPATION: Mild R scapular dyskinesia near end-range flexion   CERVICAL ROM:   Active ROM A/PROM (deg) eval  Flexion WFL  Extension WFL  Right lateral flexion WFL  Left lateral  flexion WFL  Right rotation WFL*  Left rotation WFL   (  Blank rows = not tested)  *patient states Sometimes when I do this, I will feel some tingling R shoulder blade or R index finger  UPPER EXTREMITY MMT   MMT Right eval Left eval  Shoulder flexion 4+ 4  Shoulder extension    Shoulder abduction 4+ 4  Shoulder adduction    Shoulder extension    Shoulder internal rotation 4- 4  Shoulder external rotation 4 4  Elbow flexion 5 5  Elbow extension 5 5  Wrist flexion 4 4-  Wrist extension 5 5  Wrist ulnar deviation    Wrist radial deviation    Wrist pronation    Wrist supination    Grip Strength (avg of 3 trials) 75# 71#   (Blank rows = not tested)   CERVICAL SPECIAL TESTS:  Upper limb tension test (ULTT):   Radial bias - negative  Median bias - positive    No change with L CS rotation    Small improvement with R CS rotaiton    Improved with wrist flexion     TREATMENT DATE:   [facility] Adult PT Treatment:                                                DATE: 05/31/24 Therapeutic Exercise: UBE level 1 fwd/bwd 2 mins eac Seated ER GTB 2x15 Seated diagonals RTB BIL 2x15 Seated horizontal abduction GTB 3x15 Upper trap stretch BIL 2x30 Seated ITY 2# DB 2x10 Supine shoulder flexion with dowel 2x15 Supine serratus punches 2x15 2# DB Median nerve glide 2x10 Therapeutic Activity: Standing rows 2x10 BluTB Shoulder extension 2x10 BluTB  [facility] Adult PT Treatment:                                                DATE: 05/10/2024   Initial evaluation: see patient education and home exercise program as noted below                                                                                                                                   PATIENT EDUCATION:  Education details: reviewed initial home exercise program; discussion of POC, prognosis and goals for skilled PT   Person educated: Patient Education method: Explanation, Demonstration, and Handouts Education  comprehension: verbalized understanding, returned demonstration, and needs further education  HOME EXERCISE PROGRAM: Access Code: [email or web address] URL: [email or web address] Date: 05/10/2024 Prepared by: Marko Molt  Exercises - Supine Cervical Retraction with Towel  - 2 x daily - 7 x weekly - 2 sets - 10 reps - 3 sec hold - Single Arm Serratus Punches in Supine with Dumbbell  - 2 x daily - 7 x weekly - 2 sets -  10 reps - 3 sec hold - Seated Shoulder Shrug Circles AROM Backward  - 2 x daily - 7 x weekly - 1 sets - 5-10 reps - Supine Cervical Rotation AROM on Flat Ball  - 2 x daily - 7 x weekly - 2 sets - 5 reps - Median Nerve Flossing - Tray  - 1 x daily - 7 x weekly - 2 sets - 10 reps  ASSESSMENT:  CLINICAL IMPRESSION: Patient presents to PT reporting that she is having intermittent numbness along her fingers. During today's session she stated that the tingling increased with resisted shoulder flexion, but she was still able to complete exercises. Patient was able to tolerate all exercises well. Patient will benefit from skilled PT in order to increase functional mobility.   EVAL:Kelly Tran is a 57 y.o. female who was seen today for physical therapy evaluation and treatment for Neck Pain with Radicular Symptoms. At this time, she is experiences only R UE paresthesia. She is demonstrating mild R scapular dyskinsia with shoulder elevation, decreased BIL UE MMT scores, and positive R ULTT test with median nerve bias. 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 She requires skilled PT services at this time to address relevant deficits and return to PLOF.     OBJECTIVE IMPAIRMENTS: decreased activity tolerance, decreased strength, impaired UE functional use, improper body mechanics, and pain.   ACTIVITY LIMITATIONS: carrying, lifting, sleeping, and reach over head  PARTICIPATION LIMITATIONS: cleaning, shopping,  community activity, and occupation  PERSONAL FACTORS: 1-2 comorbidities: Relevant PMHx includes hypothyroidism, migraine, lat. Dorsi mm strain (2023), GAD are also affecting patient's functional outcome.   REHAB POTENTIAL: Good  CLINICAL DECISION MAKING: Stable/uncomplicated  EVALUATION COMPLEXITY: Low   GOALS: Goals reviewed with patient? YES  SHORT TERM GOALS: Target date: 05/31/2024   Patient will be independent with initial home program at least 3 days/week.  Baseline: provided at eval Goal Status: INITIAL   2.  Patient will report absence of pain and radicular symptoms with end range CS R rotation  Baseline: see objective measures Goal Status: INITIAL     LONG TERM GOALS: Target date: 06/21/2024   Patient will report improved overall functional ability with Quick Dash score of 10 or less .  Baseline: 20.5 Goal Status: INITIAL    2.  Patient will demonstrate 4+/5 MMT with BIL UE strength testing Baseline: see objective measures  Goal status: INITIAL  3.  Patient will report ability to perform all occupational tasks without R UE symptoms at least 2 days over past week.  Baseline: symptoms with prolonged seated work which is 90% of her work day  Goal status: INITIAL  4.  Patient will report ability to sleep through the night without R UE pain or paraesthesia upon waking.  Baseline: symptoms worse upon waking Goal status: INITIAL  5.  Patient will demonstrate ability to perform floor to waist lifting of at least 25# using appropriate body mechanics and with no more than minimal pain in order to safely perform normal daily/occupational tasks.   Goal Status: INITIAL   6.  Patient will demonstrate ability to perform overhead lifting of at least 10# using appropriate body mechanics and with no more than minimal pain in order to safely perform normal daily/occupational tasks.   Goal Status: INITIAL   PLAN:  PT FREQUENCY: 1-2x/week  PT DURATION: 6 weeks  PLANNED  INTERVENTIONS: 97164- PT Re-evaluation, 97110-Therapeutic exercises, 97530- Therapeutic activity,  02887- Neuromuscular re-education, [phone]- Self Care, 02859- Manual therapy, G0283- Electrical stimulation (unattended), [phone]- Electrical stimulation (manual), [id number]- Traction (mechanical), [phone] (1-2 muscles), [id number] (3+ muscles)- Dry Needling, Patient/Family education, Taping, Joint mobilization, Spinal manipulation, Spinal mobilization, Cryotherapy, and Moist heat  PLAN FOR NEXT SESSION: address shoulder and periscapular strength/stability, median nerve glides, assess/address lifting mechanics when appropriate; consider manual therapy and modalities (including traction) in future sessions  Shanda Code, SPTA 05/31/2024 9:10 AM

## 2024-06-08 ENCOUNTER — Ambulatory Visit: Attending: Family

## 2024-06-08 DIAGNOSIS — M79601 Pain in right arm: Secondary | ICD-10-CM | POA: Insufficient documentation

## 2024-06-08 DIAGNOSIS — M5412 Radiculopathy, cervical region: Secondary | ICD-10-CM | POA: Insufficient documentation

## 2024-06-08 NOTE — Therapy (Addendum)
 OUTPATIENT PHYSICAL THERAPY TREATMENT NOTE   Patient Name: DANETT PALAZZO MRN: [id number] DOB:1965-09-24, 58 y.o., female Today's Date: 06/08/2024  END OF SESSION:  PT End of Session - 06/08/24 0826     Visit Number 3    Number of Visits 12    Date for Recertification  06/21/24    Authorization Type [employer]    Authorization - Visit Number 3    Authorization - Number of Visits 30    PT Start Time 0830    PT Stop Time 0910    PT Time Calculation (min) 40 min    Activity Tolerance Patient tolerated treatment well    Behavior During Therapy [facility] for tasks assessed/performed          Past Medical History:  Diagnosis Date   Allergy    External hemorrhoid 04/26/2013   Family history of breast cancer 01/02/2022   G E R D 02/22/2007   Genital warts    GERD (gastroesophageal reflux disease) 11/20/2015   Hemorrhoids, internal 01/31/2011   Hypothyroidism    Migraine    Migraine 03/23/2018   Past Surgical History:  Procedure Laterality Date   APPENDECTOMY     DILATION AND CURETTAGE OF UTERUS     miscarriage     x6--required d and c   Patient Active Problem List   Diagnosis Date Noted   Muscle spasm 03/31/2024   Cervical radiculopathy 03/31/2024   Strain of latissimus dorsi muscle 03/26/2022   GAD (generalized anxiety disorder) 02/21/2022   Other fatigue 02/21/2022   Post-menopausal 01/02/2022   Fibrocystic breast changes of both breasts 01/02/2022   Hypothyroidism due to Hashimoto's thyroiditis 10/26/2018   Seasonal allergies 09/25/2014    PCP: Corwin Antu, FNP   REFERRING PROVIDER: Corwin Antu, FNP   REFERRING DIAG:  [phone] (ICD-10-CM) - Spondylosis of cervical spine with radiculopathy    THERAPY DIAG:  Radiculopathy, cervical region  Pain in right arm  Rationale for Evaluation and Treatment: Rehabilitation  ONSET DATE: 03/20/2023  SUBJECTIVE:                                                                                                                                                                                                          SUBJECTIVE STATEMENT: Patient states that she isn't having any pain, just states she has a crick in her neck. She reports no soreness from last session.  EVAL:Patient reports to PT d/t with numbness and tingling in her R arm and hand. She did initially have pain for about 1-2 months, that has subsided. Only  the numbness and tingling remains. Patient states that her symptoms initially began after lifting suitcases for a trip. She does feel that her desk setup is comfortable and appropriate   Hand dominance: Right  PERTINENT HISTORY:  Relevant PMHx includes hypothyroidism, migraine, lat. Dorsi mm strain (2023), GAD  PAIN:  Are you having pain?  Yes: NPRS scale: 3/10 Pain location: forearm and hand (digit 1-3)  Pain description: tingling, numbness  Aggravating factors: when I first stand up in the morning, after being at my desk for a while  Relieving factors: lay down  PRECAUTIONS: None  RED FLAGS: None     WEIGHT BEARING RESTRICTIONS: No  FALLS:  Has patient fallen in last 6 months? 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  DIAGNOSTIC FINDINGS:  04/18/2024 Cervical Spine Xray   IMPRESSION: Moderately advanced spondylosis at C5-6 with mild left-sided foraminal narrowing. No acute osseous findings.  PATIENT SURVEYS:  QUICK DASH  Please rate your ability do the following activities in the last week by selecting the number below the appropriate response.   Activities Rating  Open a tight or new jar.  2 = Mild  difficulty  Do heavy household chores (e.g., wash walls, floors). 2 = Mild difficulty  Carry a shopping bag or briefcase 2 = Mild difficulty  Wash your back. 1 = No difficulty   Use a knife to cut food. 1 = No difficulty   Recreational activities in which you take some force or impact through your arm, shoulder or hand (e.g., golf, hammering, tennis, etc.). 3 = Moderate difficulty  During the past week, to what extent has your arm, shoulder or hand problem interfered with your normal social activities with family, friends, neighbors or groups?  2 = Slightly  During the past week, were you limited in your work or other regular daily activities as a result of your arm, shoulder or hand problem? 2 = Slightly limited  Rate the severity of the following symptoms in the last week: Arm, Shoulder, or hand pain. 1 = none  Rate the severity of the following symptoms in the last week: Tingling (pins and needles) in your arm, shoulder or hand. 3 = Moderate  During the past week, how much difficulty have you had sleeping because of the pain in your arm, shoulder or hand?  1 = No difficulty   (A QuickDASH score may not be calculated if there is greater than 1 missing item.)  Quick Dash Disability/Symptom Score: 20.5  Minimally Clinically Important Difference (MCID): 15-20 points  Flavio, F. et al. (2013). Minimally clinically important difference of the disabilities of the arm, shoulder, and hand outcome measures (DASH) and its shortened version (Quick DASH). Journal of Orthopaedic & Sports Physical Therapy, 44(1), 30-39)   COGNITION: Overall cognitive status: Within functional limits for tasks assessed  SENSATION: Not tested  POSTURE: No Significant postural limitations  PALPATION: Mild R scapular dyskinesia near end-range flexion   CERVICAL ROM:   Active ROM A/PROM (deg) eval  Flexion WFL  Extension WFL  Right lateral flexion WFL  Left lateral flexion WFL  Right rotation WFL*   Left rotation WFL   (Blank rows = not  tested)  *patient states Sometimes when I do this, I will feel some tingling R shoulder blade or R index finger  UPPER EXTREMITY MMT   MMT Right eval Left eval  Shoulder flexion 4+ 4  Shoulder extension    Shoulder abduction 4+ 4  Shoulder adduction    Shoulder extension    Shoulder internal rotation 4- 4  Shoulder external rotation 4 4  Elbow flexion 5 5  Elbow extension 5 5  Wrist flexion 4 4-  Wrist extension 5 5  Wrist ulnar deviation    Wrist radial deviation    Wrist pronation    Wrist supination    Grip Strength (avg of 3 trials) 75# 71#   (Blank rows = not tested)   CERVICAL SPECIAL TESTS:  Upper limb tension test (ULTT):   Radial bias - negative  Median bias - positive    No change with L CS rotation    Small improvement with R CS rotaiton    Improved with wrist flexion     TREATMENT DATE:  [facility] Adult PT Treatment:                                                DATE: 06/08/24 Therapeutic Exercise: UBE level 2 fwd/bwd 3 mins ea Seated ER GTB 2x15 Seated horizontal abduction GTB 3x15 Upper trap stretch BIL 2x30 Seated ITY 2# DB 2x10 Supine OH shoulder flexion 2# 2x10 Supine serratus punches 2x10 2# DB Median nerve glide x10 High/low row 25# 2x15 Shoulder extension FM 17# 2x15  [facility] Adult PT Treatment:                                                DATE: 05/31/24 Therapeutic Exercise: UBE level 1 fwd/bwd 2 mins eac Seated ER GTB 2x15 Seated diagonals RTB BIL 2x15 Seated horizontal abduction GTB 3x15 Upper trap stretch BIL 2x30 Seated ITY 2# DB 2x10 Supine shoulder flexion with dowel 2x15 Supine serratus punches 2x15 2# DB Median nerve glide 2x10 Therapeutic Activity: Standing rows 2x10 BluTB Shoulder extension 2x10 BluTB  [facility] Adult PT Treatment:                                                DATE: 05/10/2024   Initial evaluation: see patient education and home exercise program as noted below                                                                                                                                    PATIENT  EDUCATION:  Education details: reviewed initial home exercise program; discussion of POC, prognosis and goals for skilled PT   Person educated: Patient Education method: Explanation, Demonstration, and Handouts Education comprehension: verbalized understanding, returned demonstration, and needs further education  HOME EXERCISE PROGRAM: Access Code: [email or web address] URL: [email or web address] Date: 05/10/2024 Prepared by: Marko Molt  Exercises - Supine Cervical Retraction with Towel  - 2 x daily - 7 x weekly - 2 sets - 10 reps - 3 sec hold - Single Arm Serratus Punches in Supine with Dumbbell  - 2 x daily - 7 x weekly - 2 sets - 10 reps - 3 sec hold - Seated Shoulder Shrug Circles AROM Backward  - 2 x daily - 7 x weekly - 1 sets - 5-10 reps - Supine Cervical Rotation AROM on Flat Ball  - 2 x daily - 7 x weekly - 2 sets - 5 reps - Median Nerve Flossing - Tray  - 1 x daily - 7 x weekly - 2 sets - 10 reps  ASSESSMENT:  CLINICAL IMPRESSION: Patient presents to PT reporting that she is still having intermittent numbness that comes and goes. Otherwise reports feeling good. Patient reports increased tingling with shoulder flexion, patient stated that tingling subsided a few minutes later. Today's session focused on periscapular strengthening. Patient tolerated all exercises well with increased resistance of exercises. Patient will benefit from skilled PT in order to increase functional mobility.   EVAL:Murlean is a 58 y.o. female who was seen today for physical therapy evaluation and treatment for Neck Pain with Radicular Symptoms. At this time, she is experiences only R UE paresthesia. She is demonstrating mild R scapular dyskinsia with shoulder elevation, decreased BIL UE MMT scores, and positive R ULTT test with median nerve bias. She has related pain  and difficulty with office work and has increased symptoms when getting out of bed in the morning. She requires skilled PT services at this time to address relevant deficits and return to PLOF.     OBJECTIVE IMPAIRMENTS: decreased activity tolerance, decreased strength, impaired UE functional use, improper body mechanics, and pain.   ACTIVITY LIMITATIONS: carrying, lifting, sleeping, and reach over head  PARTICIPATION LIMITATIONS: cleaning, shopping, community activity, and occupation  PERSONAL FACTORS: 1-2 comorbidities: Relevant PMHx includes hypothyroidism, migraine, lat. Dorsi mm strain (2023), GAD are also affecting patient's functional outcome.   REHAB POTENTIAL: Good  CLINICAL DECISION MAKING: Stable/uncomplicated  EVALUATION COMPLEXITY: Low   GOALS: Goals reviewed with patient? YES  SHORT TERM GOALS: Target date: 05/31/2024   Patient will be independent with initial home program at least 3 days/week.  Baseline: provided at eval Goal Status: INITIAL   2.  Patient will report absence of pain and radicular symptoms with end range CS R rotation  Baseline: see objective measures Goal Status: INITIAL     LONG TERM GOALS: Target date: 06/21/2024   Patient will report improved overall functional ability with Quick Dash score of 10 or less .  Baseline: 20.5 Goal Status: INITIAL    2.  Patient will demonstrate 4+/5 MMT with BIL UE strength testing Baseline: see objective measures  Goal status: INITIAL  3.  Patient will report ability to perform all occupational tasks without R UE symptoms at least 2 days over past week.  Baseline: symptoms with prolonged seated work which is 90% of her work day  Goal status: INITIAL  4.  Patient will report ability to sleep through the night without R UE pain or paraesthesia upon waking.  Baseline:  symptoms worse upon waking Goal status: INITIAL  5.  Patient will demonstrate ability to perform floor to waist lifting of at least 25#  using appropriate body mechanics and with no more than minimal pain in order to safely perform normal daily/occupational tasks.   Goal Status: INITIAL   6.  Patient will demonstrate ability to perform overhead lifting of at least 10# using appropriate body mechanics and with no more than minimal pain in order to safely perform normal daily/occupational tasks.   Goal Status: INITIAL   PLAN:  PT FREQUENCY: 1-2x/week  PT DURATION: 6 weeks  PLANNED INTERVENTIONS: 02835- PT Re-evaluation, 97110-Therapeutic exercises, 97530- Therapeutic activity, 97112- Neuromuscular re-education, 97535- Self Care, 02859- Manual therapy, [id number]- Electrical stimulation (unattended), [phone]- Electrical stimulation (manual), [id number]- Traction (mechanical), 20560 (1-2 muscles), 20561 (3+ muscles)- Dry Needling, Patient/Family education, Taping, Joint mobilization, Spinal manipulation, Spinal mobilization, Cryotherapy, and Moist heat  PLAN FOR NEXT SESSION: address shoulder and periscapular strength/stability, median nerve glides, assess/address lifting mechanics when appropriate; consider manual therapy and modalities (including traction) in future sessions  Shanda Code, SPTA 06/08/2024 9:21 AM

## 2024-06-13 NOTE — Therapy (Incomplete)
 OUTPATIENT PHYSICAL THERAPY TREATMENT NOTE   Patient Name: ADRINNE Tran MRN: [id number] DOB:11/19/1965, 58 y.o., female Today's Date: 06/13/2024  END OF SESSION:    Past Medical History:  Diagnosis Date   Allergy    External hemorrhoid 04/26/2013   Family history of breast cancer 01/02/2022   G E R D 02/22/2007   Genital warts    GERD (gastroesophageal reflux disease) 11/20/2015   Hemorrhoids, internal 01/31/2011   Hypothyroidism    Migraine    Migraine 03/23/2018   Past Surgical History:  Procedure Laterality Date   APPENDECTOMY     DILATION AND CURETTAGE OF UTERUS     miscarriage     x6--required d and c   Patient Active Problem List   Diagnosis Date Noted   Muscle spasm 03/31/2024   Cervical radiculopathy 03/31/2024   Strain of latissimus dorsi muscle 03/26/2022   GAD (generalized anxiety disorder) 02/21/2022   Other fatigue 02/21/2022   Post-menopausal 01/02/2022   Fibrocystic breast changes of both breasts 01/02/2022   Hypothyroidism due to Hashimoto's thyroiditis 10/26/2018   Seasonal allergies 09/25/2014    PCP: Corwin Antu, FNP   REFERRING PROVIDER: Corwin Antu, FNP   REFERRING DIAG:  M47.22 (ICD-10-CM) - Spondylosis of cervical spine with radiculopathy    THERAPY DIAG:  No diagnosis found.  Rationale for Evaluation and Treatment: Rehabilitation  ONSET DATE: 03/20/2023  SUBJECTIVE:                                                                                                                                                                                                         SUBJECTIVE STATEMENT: ***  Patient states that she isn't having any pain, just states she has a crick in her neck. She reports no soreness from last session.  EVAL:Patient reports to PT d/t with numbness and tingling in her R arm and hand. She did initially have pain for about 1-2 months, that has subsided. Only the numbness and tingling remains. Patient states  that her symptoms initially began after lifting suitcases for a trip. She does feel that her desk setup is comfortable and appropriate   Hand dominance: Right  PERTINENT HISTORY:  Relevant PMHx includes hypothyroidism, migraine, lat. Dorsi mm strain (2023), GAD  PAIN:  Are you having pain?  Yes: NPRS scale: 3/10 Pain location: forearm and hand (digit 1-3)  Pain description: tingling, numbness  Aggravating factors: when I first stand up in the morning, after being at my desk for a while  Relieving factors: lay down  PRECAUTIONS: None  RED FLAGS: None  WEIGHT BEARING RESTRICTIONS: No  FALLS:  Has patient fallen in last 6 months? 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  DIAGNOSTIC FINDINGS:  04/18/2024 Cervical Spine Xray   IMPRESSION: Moderately advanced spondylosis at C5-6 with mild left-sided foraminal narrowing. No acute osseous findings.  PATIENT SURVEYS:  QUICK DASH  Please rate your ability do the following activities in the last week by selecting the number below the appropriate response.   Activities Rating  Open a tight or new jar.  2 = Mild difficulty  Do heavy household chores (e.g., wash walls, floors). 2 = Mild difficulty  Carry a shopping bag or briefcase 2 = Mild difficulty  Wash your back. 1 = No difficulty   Use a knife to cut food. 1 = No difficulty   Recreational activities in which you take some force or impact through your arm, shoulder or hand (e.g., golf, hammering, tennis, etc.). 3 = Moderate difficulty  During the past week, to what extent has your arm, shoulder or  hand problem interfered with your normal social activities with family, friends, neighbors or groups?  2 = Slightly  During the past week, were you limited in your work or other regular daily activities as a result of your arm, shoulder or hand problem? 2 = Slightly limited  Rate the severity of the following symptoms in the last week: Arm, Shoulder, or hand pain. 1 = none  Rate the severity of the following symptoms in the last week: Tingling (pins and needles) in your arm, shoulder or hand. 3 = Moderate  During the past week, how much difficulty have you had sleeping because of the pain in your arm, shoulder or hand?  1 = No difficulty   (A QuickDASH score may not be calculated if there is greater than 1 missing item.)  Quick Dash Disability/Symptom Score: 20.5  Minimally Clinically Important Difference (MCID): 15-20 points  Kelly Tran, F. et al. (2013). Minimally clinically important difference of the disabilities of the arm, shoulder, and hand outcome measures (DASH) and its shortened version (Quick DASH). Journal of Orthopaedic & Sports Physical Therapy, 44(1), 30-39)   COGNITION: Overall cognitive status: Within functional limits for tasks assessed  SENSATION: Not tested  POSTURE: No Significant postural limitations  PALPATION: Mild R scapular dyskinesia near end-range flexion   CERVICAL ROM:   Active ROM A/PROM (deg) eval  Flexion WFL  Extension WFL  Right lateral flexion WFL  Left lateral flexion WFL  Right rotation WFL*  Left rotation WFL   (Blank rows = not tested)  *patient states Sometimes when I do this, I will feel some tingling R shoulder blade or R index finger  UPPER EXTREMITY MMT   MMT Right eval Left eval  Shoulder flexion 4+ 4  Shoulder extension    Shoulder abduction 4+ 4  Shoulder adduction    Shoulder extension    Shoulder internal rotation 4- 4  Shoulder external rotation 4 4  Elbow flexion 5 5  Elbow extension 5 5  Wrist flexion 4  4-  Wrist extension 5 5  Wrist ulnar deviation    Wrist radial deviation    Wrist pronation    Wrist  supination    Grip Strength (avg of 3 trials) 75# 71#   (Blank rows = not tested)   CERVICAL SPECIAL TESTS:  Upper limb tension test (ULTT):   Radial bias - negative  Median bias - positive    No change with L CS rotation    Small improvement with R CS rotaiton    Improved with wrist flexion     TREATMENT DATE:  [facility] PT Treatment:                                                DATE: 06/14/24 Therapeutic Exercise: UBE level 2 fwd/bwd 3 mins ea Seated ER GTB 2x15 Seated horizontal abduction GTB 3x15 Upper trap stretch BIL 2x30 Seated ITY 2# DB 2x10 Supine OH shoulder flexion 2# 2x10 Supine serratus punches 2x10 2# DB Median nerve glide x10 High/low row 25# 2x15 Shoulder extension FM 17# 2x15  [facility] Adult PT Treatment:                                                DATE: 06/08/24 Therapeutic Exercise: UBE level 2 fwd/bwd 3 mins ea Seated ER GTB 2x15 Seated horizontal abduction GTB 3x15 Upper trap stretch BIL 2x30 Seated ITY 2# DB 2x10 Supine OH shoulder flexion 2# 2x10 Supine serratus punches 2x10 2# DB Median nerve glide x10 High/low row 25# 2x15 Shoulder extension FM 17# 2x15  [facility] Adult PT Treatment:                                                DATE: 05/31/24 Therapeutic Exercise: UBE level 1 fwd/bwd 2 mins eac Seated ER GTB 2x15 Seated diagonals RTB BIL 2x15 Seated horizontal abduction GTB 3x15 Upper trap stretch BIL 2x30 Seated ITY 2# DB 2x10 Supine shoulder flexion with dowel 2x15 Supine serratus punches 2x15 2# DB Median nerve glide 2x10 Therapeutic Activity: Standing rows 2x10 BluTB Shoulder extension 2x10 BluTB  [facility] Adult PT Treatment:                                                DATE: 05/10/2024   Initial evaluation: see patient education and home exercise program as noted below                                                                                                                                    PATIENT EDUCATION:  Education details: reviewed initial home exercise program; discussion of POC, prognosis and goals for skilled PT   Person educated: Patient Education method: Explanation, Demonstration, and Handouts Education comprehension: verbalized understanding, returned demonstration, and needs further education  HOME EXERCISE PROGRAM: Access Code: [email or web address] URL: [email or web address] Date: 05/10/2024 Prepared by: Marko Molt  Exercises - Supine Cervical Retraction with Towel  - 2 x daily - 7 x weekly - 2 sets - 10 reps - 3 sec hold - Single Arm Serratus Punches in Supine with Dumbbell  - 2 x daily - 7 x weekly - 2 sets - 10 reps - 3 sec hold - Seated Shoulder Shrug Circles AROM Backward  - 2 x daily - 7 x weekly - 1 sets - 5-10 reps - Supine Cervical Rotation AROM on Flat Ball  - 2 x daily - 7 x weekly - 2 sets - 5 reps - Median Nerve Flossing - Tray  - 1 x daily - 7 x weekly - 2 sets - 10 reps  ASSESSMENT:  CLINICAL IMPRESSION: ***  Patient presents to PT reporting that she is still having intermittent numbness that comes and goes. Otherwise reports feeling good. Patient reports increased tingling with shoulder flexion, patient stated that tingling subsided a few minutes later. Today's session focused on periscapular strengthening. Patient tolerated all exercises well with increased resistance of exercises. Patient will benefit from skilled PT in order to increase functional mobility.   EVAL:Alycea is a 58 y.o. female who was seen today for physical therapy evaluation and treatment for Neck Pain with Radicular Symptoms. At this time, she is experiences only R UE paresthesia. She is demonstrating mild R scapular dyskinsia with shoulder elevation, decreased BIL UE MMT scores, and positive R ULTT test with median nerve bias. 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 She requires skilled PT services at this time to address relevant deficits and return to PLOF.     OBJECTIVE IMPAIRMENTS: decreased activity tolerance, decreased strength, impaired UE functional use, improper body mechanics, and pain.   ACTIVITY LIMITATIONS: carrying, lifting, sleeping, and reach over head  PARTICIPATION LIMITATIONS: cleaning, shopping, community activity, and occupation  PERSONAL FACTORS: 1-2 comorbidities: Relevant PMHx includes hypothyroidism, migraine, lat. Dorsi mm strain (2023), GAD are also affecting patient's functional outcome.   REHAB POTENTIAL: Good  CLINICAL DECISION MAKING: Stable/uncomplicated  EVALUATION COMPLEXITY: Low   GOALS: Goals reviewed with patient? YES  SHORT TERM GOALS: Target date: 05/31/2024   Patient will be independent with initial home program at least 3 days/week.  Baseline: provided at eval Goal Status: INITIAL   2.  Patient will report absence of pain and radicular symptoms with end range CS R rotation  Baseline: see objective measures Goal Status: INITIAL     LONG TERM GOALS: Target date: 06/21/2024   Patient will report improved overall functional ability with Quick Dash score of 10 or less .  Baseline: 20.5 Goal Status: INITIAL    2.  Patient will demonstrate 4+/5 MMT with BIL UE strength testing Baseline: see objective measures  Goal status: INITIAL  3.  Patient will report ability to perform all occupational tasks without R UE symptoms at least 2 days over past week.  Baseline: symptoms with prolonged seated work which is 90% of her work day  Goal status: INITIAL  4.  Patient will report ability to sleep through the night without R UE pain or paraesthesia upon waking.  Baseline:  symptoms worse upon waking Goal status: INITIAL  5.  Patient will demonstrate ability to perform floor to waist lifting of at least 25# using appropriate body mechanics and  with no more than minimal pain in order to safely perform normal daily/occupational tasks.   Goal Status: INITIAL   6.  Patient will demonstrate ability to perform overhead lifting of at least 10# using appropriate body mechanics and with no more than minimal pain in order to safely perform normal daily/occupational tasks.   Goal Status: INITIAL   PLAN:  PT FREQUENCY: 1-2x/week  PT DURATION: 6 weeks  PLANNED INTERVENTIONS: 02835- PT Re-evaluation, 97110-Therapeutic exercises, 97530- Therapeutic activity, 97112- Neuromuscular re-education, 97535- Self Care, 02859- Manual therapy, [id number]- Electrical stimulation (unattended), [phone]- Electrical stimulation (manual), [id number]- Traction (mechanical), 20560 (1-2 muscles), 20561 (3+ muscles)- Dry Needling, Patient/Family education, Taping, Joint mobilization, Spinal manipulation, Spinal mobilization, Cryotherapy, and Moist heat  PLAN FOR NEXT SESSION: address shoulder and periscapular strength/stability, median nerve glides, assess/address lifting mechanics when appropriate; consider manual therapy and modalities (including traction) in future sessions  Shanda Code, SPTA 06/13/2024 10:17 AM

## 2024-06-14 ENCOUNTER — Ambulatory Visit

## 2024-06-14 DIAGNOSIS — M79601 Pain in right arm: Secondary | ICD-10-CM

## 2024-06-14 DIAGNOSIS — M5412 Radiculopathy, cervical region: Secondary | ICD-10-CM

## 2024-06-14 NOTE — Therapy (Signed)
 OUTPATIENT PHYSICAL THERAPY TREATMENT NOTE   Patient Name: NEILANI DUFFEE MRN: [id number] DOB:03-28-1966, 58 y.o., female Today's Date: 06/14/2024  END OF SESSION:  PT End of Session - 06/14/24 0836     Visit Number 4    Number of Visits 12    Date for Recertification  06/21/24    Authorization Type [employer]    Authorization - Visit Number 4    Authorization - Number of Visits 30    PT Start Time 0835    PT Stop Time 0913    PT Time Calculation (min) 38 min    Activity Tolerance Patient tolerated treatment well    Behavior During Therapy [facility] for tasks assessed/performed           Past Medical History:  Diagnosis Date   Allergy    External hemorrhoid 04/26/2013   Family history of breast cancer 01/02/2022   G E R D 02/22/2007   Genital warts    GERD (gastroesophageal reflux disease) 11/20/2015   Hemorrhoids, internal 01/31/2011   Hypothyroidism    Migraine    Migraine 03/23/2018   Past Surgical History:  Procedure Laterality Date   APPENDECTOMY     DILATION AND CURETTAGE OF UTERUS     miscarriage     x6--required d and c   Patient Active Problem List   Diagnosis Date Noted   Muscle spasm 03/31/2024   Cervical radiculopathy 03/31/2024   Strain of latissimus dorsi muscle 03/26/2022   GAD (generalized anxiety disorder) 02/21/2022   Other fatigue 02/21/2022   Post-menopausal 01/02/2022   Fibrocystic breast changes of both breasts 01/02/2022   Hypothyroidism due to Hashimoto's thyroiditis 10/26/2018   Seasonal allergies 09/25/2014    PCP: Corwin Antu, FNP   REFERRING PROVIDER: Corwin Antu, FNP   REFERRING DIAG:  [phone] (ICD-10-CM) - Spondylosis of cervical spine with radiculopathy    THERAPY DIAG:  Radiculopathy, cervical region  Pain in right arm  Rationale for Evaluation and Treatment: Rehabilitation  ONSET DATE: 03/20/2023  SUBJECTIVE:                                                                                                                                                                                                          SUBJECTIVE STATEMENT: Patient reports that she has had more numbness/tingling in her right fingertips lately.  EVAL:Patient reports to PT d/t with numbness and tingling in her R arm and hand. She did initially have pain for about 1-2 months, that has subsided. Only the numbness and tingling remains. Patient states that her symptoms  initially began after lifting suitcases for a trip. She does feel that her desk setup is comfortable and appropriate   Hand dominance: Right  PERTINENT HISTORY:  Relevant PMHx includes hypothyroidism, migraine, lat. Dorsi mm strain (2023), GAD  PAIN:  Are you having pain?  Yes: NPRS scale: 3/10 Pain location: forearm and hand (digit 1-3)  Pain description: tingling, numbness  Aggravating factors: when I first stand up in the morning, after being at my desk for a while  Relieving factors: lay down  PRECAUTIONS: None  RED FLAGS: None     WEIGHT BEARING RESTRICTIONS: No  FALLS:  Has patient fallen in last 6 months? 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  DIAGNOSTIC FINDINGS:  04/18/2024 Cervical Spine Xray   IMPRESSION: Moderately advanced spondylosis at C5-6 with mild left-sided foraminal narrowing. No acute osseous findings.  PATIENT SURVEYS:  QUICK DASH  Please rate your ability do the following activities in the last week by selecting the number below the appropriate response.   Activities Rating  Open a tight or new jar.  2 = Mild difficulty  Do heavy household chores  (e.g., wash walls, floors). 2 = Mild difficulty  Carry a shopping bag or briefcase 2 = Mild difficulty  Wash your back. 1 = No difficulty   Use a knife to cut food. 1 = No difficulty   Recreational activities in which you take some force or impact through your arm, shoulder or hand (e.g., golf, hammering, tennis, etc.). 3 = Moderate difficulty  During the past week, to what extent has your arm, shoulder or hand problem interfered with your normal social activities with family, friends, neighbors or groups?  2 = Slightly  During the past week, were you limited in your work or other regular daily activities as a result of your arm, shoulder or hand problem? 2 = Slightly limited  Rate the severity of the following symptoms in the last week: Arm, Shoulder, or hand pain. 1 = none  Rate the severity of the following symptoms in the last week: Tingling (pins and needles) in your arm, shoulder or hand. 3 = Moderate  During the past week, how much difficulty have you had sleeping because of the pain in your arm, shoulder or hand?  1 = No difficulty   (A QuickDASH score may not be calculated if there is greater than 1 missing item.)  Quick Dash Disability/Symptom Score: 20.5  Minimally Clinically Important Difference (MCID): 15-20 points  Flavio, F. et al. (2013). Minimally clinically important difference of the disabilities of the arm, shoulder, and hand outcome measures (DASH) and its shortened version (Quick DASH). Journal of Orthopaedic & Sports Physical Therapy, 44(1), 30-39)   COGNITION: Overall cognitive status: Within functional limits for tasks assessed  SENSATION: Not tested  POSTURE: No Significant postural limitations  PALPATION: Mild R scapular dyskinesia near end-range flexion   CERVICAL ROM:   Active ROM A/PROM (deg) eval  Flexion WFL  Extension WFL  Right lateral flexion WFL  Left lateral flexion WFL  Right rotation WFL*  Left rotation WFL   (Blank rows = not  tested)  *patient states Sometimes when I do this, I  will feel some tingling R shoulder blade or R index finger  UPPER EXTREMITY MMT   MMT Right eval Left eval  Shoulder flexion 4+ 4  Shoulder extension    Shoulder abduction 4+ 4  Shoulder adduction    Shoulder extension    Shoulder internal rotation 4- 4  Shoulder external rotation 4 4  Elbow flexion 5 5  Elbow extension 5 5  Wrist flexion 4 4-  Wrist extension 5 5  Wrist ulnar deviation    Wrist radial deviation    Wrist pronation    Wrist supination    Grip Strength (avg of 3 trials) 75# 71#   (Blank rows = not tested)   CERVICAL SPECIAL TESTS:  Upper limb tension test (ULTT):   Radial bias - negative  Median bias - positive    No change with L CS rotation    Small improvement with R CS rotaiton    Improved with wrist flexion    TREATMENT DATE:  [facility] Adult PT Treatment:                                                DATE: 06/14/24 Therapeutic Exercise: UBE level 2 fwd/bwd 3 mins ea Upper trap stretch BIL 2x30 Seated ITY 2# DB 2x10 Supine alternating OH shoulder flexion 2# 2x10 Supine serratus punches 2x10 2# DB Supine chest press 2# DB 2x10 Neuromuscular Re-ed: Seated ER GTB 2x15 Seated horizontal abduction GTB 2x15 Median nerve glide tray RUE 2x10 High/low row 25# 2x15 Shoulder extension FM 20# 2x15   [facility] Adult PT Treatment:                                                DATE: 06/08/24 Therapeutic Exercise: UBE level 2 fwd/bwd 3 mins ea Seated ER GTB 2x15 Seated horizontal abduction GTB 3x15 Upper trap stretch BIL 2x30 Seated ITY 2# DB 2x10 Supine OH shoulder flexion 2# 2x10 Supine serratus punches 2x10 2# DB Median nerve glide x10 High/low row 25# 2x15 Shoulder extension FM 17# 2x15  [facility] Adult PT Treatment:                                                DATE: 05/31/24 Therapeutic Exercise: UBE level 1 fwd/bwd 2 mins eac Seated ER GTB 2x15 Seated diagonals RTB BIL 2x15 Seated horizontal  abduction GTB 3x15 Upper trap stretch BIL 2x30 Seated ITY 2# DB 2x10 Supine shoulder flexion with dowel 2x15 Supine serratus punches 2x15 2# DB Median nerve glide 2x10 Therapeutic Activity: Standing rows 2x10 BluTB Shoulder extension 2x10 BluTB    PATIENT EDUCATION:  Education details: reviewed initial home exercise program; discussion of POC, prognosis and goals for skilled PT   Person educated: Patient Education method: Explanation, Demonstration, and Handouts Education comprehension: verbalized understanding, returned demonstration, and needs further education  HOME EXERCISE PROGRAM: Access Code: [email or web address] URL: [email or web address] Date: 05/10/2024 Prepared by: Marko Molt  Exercises - Supine Cervical Retraction with Towel  - 2 x daily - 7 x weekly - 2 sets - 10 reps - 3 sec hold - Single Arm Serratus Punches in  Supine with Dumbbell  - 2 x daily - 7 x weekly - 2 sets - 10 reps - 3 sec hold - Seated Shoulder Shrug Circles AROM Backward  - 2 x daily - 7 x weekly - 1 sets - 5-10 reps - Supine Cervical Rotation AROM on Flat Ball  - 2 x daily - 7 x weekly - 2 sets - 5 reps - Median Nerve Flossing - Tray  - 1 x daily - 7 x weekly - 2 sets - 10 reps  ASSESSMENT:  CLINICAL IMPRESSION: Patient presents to PT reporting increased numbness/tingling in her right hand lately, has not been compliant with HEP. Encouraged patient to complete home exercises to reduce peripheral effects and promote centralization of symptoms. She notices more of the symptoms first thing in the morning. Session today focused on UE strengthening and nerve glides to reduce N/T. Patient was able to tolerate all prescribed exercises with no adverse effects. Patient continues to benefit from skilled PT services and should be progressed as able to improve functional independence.   EVAL:Abbrielle is a 58 y.o. female who was seen today for physical therapy evaluation and treatment for Neck Pain with  Radicular Symptoms. At this time, she is experiences only R UE paresthesia. She is demonstrating mild R scapular dyskinsia with shoulder elevation, decreased BIL UE MMT scores, and positive R ULTT test with median nerve bias. 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 She requires skilled PT services at this time to address relevant deficits and return to PLOF.     OBJECTIVE IMPAIRMENTS: decreased activity tolerance, decreased strength, impaired UE functional use, improper body mechanics, and pain.   ACTIVITY LIMITATIONS: carrying, lifting, sleeping, and reach over head  PARTICIPATION LIMITATIONS: cleaning, shopping, community activity, and occupation  PERSONAL FACTORS: 1-2 comorbidities: Relevant PMHx includes hypothyroidism, migraine, lat. Dorsi mm strain (2023), GAD are also affecting patient's functional outcome.   REHAB POTENTIAL: Good  CLINICAL DECISION MAKING: Stable/uncomplicated  EVALUATION COMPLEXITY: Low   GOALS: Goals reviewed with patient? YES  SHORT TERM GOALS: Target date: 05/31/2024   Patient will be independent with initial home program at least 3 days/week.  Baseline: provided at eval Goal Status: Ongoing   2.  Patient will report absence of pain and radicular symptoms with end range CS R rotation  Baseline: see objective measures Goal Status: Ongoing     LONG TERM GOALS: Target date: 06/21/2024   Patient will report improved overall functional ability with Quick Dash score of 10 or less .  Baseline: 20.5 Goal Status: INITIAL    2.  Patient will demonstrate 4+/5 MMT with BIL UE strength testing Baseline: see objective measures  Goal status: INITIAL  3.  Patient will report ability to perform all occupational tasks without R UE symptoms at least 2 days over past week.  Baseline: symptoms with prolonged seated work which is 90% of her work day  Goal status: INITIAL  4.   Patient will report ability to sleep through the night without R UE pain or paraesthesia upon waking.  Baseline: symptoms worse upon waking Goal status: INITIAL  5.  Patient will demonstrate ability to perform floor to waist lifting of at least 25# using appropriate body mechanics and with no more than minimal pain in order to safely perform normal daily/occupational tasks.   Goal Status: INITIAL   6.  Patient will demonstrate ability to perform overhead lifting of at least 10#  using appropriate body mechanics and with no more than minimal pain in order to safely perform normal daily/occupational tasks.   Goal Status: INITIAL   PLAN:  PT FREQUENCY: 1-2x/week  PT DURATION: 6 weeks  PLANNED INTERVENTIONS: 02835- PT Re-evaluation, 97110-Therapeutic exercises, 97530- Therapeutic activity, 97112- Neuromuscular re-education, 97535- Self Care, 02859- Manual therapy, [id number]- Electrical stimulation (unattended), [phone]- Electrical stimulation (manual), [id number]- Traction (mechanical), 20560 (1-2 muscles), 20561 (3+ muscles)- Dry Needling, Patient/Family education, Taping, Joint mobilization, Spinal manipulation, Spinal mobilization, Cryotherapy, and Moist heat  PLAN FOR NEXT SESSION: address shoulder and periscapular strength/stability, median nerve glides, assess/address lifting mechanics when appropriate; consider manual therapy and modalities (including traction) in future sessions  Corean Pouch PTA  06/14/2024 9:12 AM

## 2024-06-16 ENCOUNTER — Ambulatory Visit

## 2024-06-16 DIAGNOSIS — M5412 Radiculopathy, cervical region: Secondary | ICD-10-CM

## 2024-06-16 DIAGNOSIS — M79601 Pain in right arm: Secondary | ICD-10-CM

## 2024-06-16 NOTE — Therapy (Signed)
 OUTPATIENT PHYSICAL THERAPY TREATMENT NOTE   Patient Name: Kelly Tran MRN: [id number] DOB:1965/10/10, 58 y.o., female Today's Date: 06/16/2024  END OF SESSION:     Past Medical History:  Diagnosis Date   Allergy    External hemorrhoid 04/26/2013   Family history of breast cancer 01/02/2022   G E R D 02/22/2007   Genital warts    GERD (gastroesophageal reflux disease) 11/20/2015   Hemorrhoids, internal 01/31/2011   Hypothyroidism    Migraine    Migraine 03/23/2018   Past Surgical History:  Procedure Laterality Date   APPENDECTOMY     DILATION AND CURETTAGE OF UTERUS     miscarriage     x6--required d and c   Patient Active Problem List   Diagnosis Date Noted   Muscle spasm 03/31/2024   Cervical radiculopathy 03/31/2024   Strain of latissimus dorsi muscle 03/26/2022   GAD (generalized anxiety disorder) 02/21/2022   Other fatigue 02/21/2022   Post-menopausal 01/02/2022   Fibrocystic breast changes of both breasts 01/02/2022   Hypothyroidism due to Hashimoto's thyroiditis 10/26/2018   Seasonal allergies 09/25/2014    PCP: Corwin Antu, FNP   REFERRING PROVIDER: Corwin Antu, FNP   REFERRING DIAG:  [phone] (ICD-10-CM) - Spondylosis of cervical spine with radiculopathy    THERAPY DIAG:  Radiculopathy, cervical region  Pain in right arm  Rationale for Evaluation and Treatment: Rehabilitation  ONSET DATE: 03/20/2023  SUBJECTIVE:                                                                                                                                                                                                         SUBJECTIVE STATEMENT: Patient reports that she has been feeling better, but continues to have numbness and tingling in her R hand. She would like to hold off on scheduling additional visits for about 30 days to trial independence with HEP.   EVAL:Patient reports to PT d/t with numbness and tingling in her R arm and hand. She did  initially have pain for about 1-2 months, that has subsided. Only the numbness and tingling remains. Patient states that her symptoms initially began after lifting suitcases for a trip. She does feel that her desk setup is comfortable and appropriate   Hand dominance: Right  PERTINENT HISTORY:  Relevant PMHx includes hypothyroidism, migraine, lat. Dorsi mm strain (2023), GAD  PAIN:  Are you having pain?  Yes: NPRS scale: 3/10 Pain location: forearm and hand (digit 1-3)  Pain description: tingling, numbness  Aggravating factors: when I first stand up in the morning, after  being at my desk for a while  Relieving factors: lay down  PRECAUTIONS: None  RED FLAGS: None     WEIGHT BEARING RESTRICTIONS: No  FALLS:  Has patient fallen in last 6 months? 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  DIAGNOSTIC FINDINGS:  04/18/2024 Cervical Spine Xray   IMPRESSION: Moderately advanced spondylosis at C5-6 with mild left-sided foraminal narrowing. No acute osseous findings.  PATIENT SURVEYS:  QUICK DASH  Please rate your ability do the following activities in the last week by selecting the number below the appropriate response.   Activities Rating  Open a tight or new jar.  2 = Mild difficulty  Do heavy household chores (e.g., wash walls, floors). 2 = Mild difficulty  Carry a shopping bag or briefcase 2 = Mild difficulty  Wash your back. 1 = No difficulty   Use a knife to cut food. 1 = No difficulty   Recreational activities in which you take some force or impact through your arm, shoulder or hand (e.g., golf,  hammering, tennis, etc.). 3 = Moderate difficulty  During the past week, to what extent has your arm, shoulder or hand problem interfered with your normal social activities with family, friends, neighbors or groups?  2 = Slightly  During the past week, were you limited in your work or other regular daily activities as a result of your arm, shoulder or hand problem? 2 = Slightly limited  Rate the severity of the following symptoms in the last week: Arm, Shoulder, or hand pain. 1 = none  Rate the severity of the following symptoms in the last week: Tingling (pins and needles) in your arm, shoulder or hand. 3 = Moderate  During the past week, how much difficulty have you had sleeping because of the pain in your arm, shoulder or hand?  1 = No difficulty   (A QuickDASH score may not be calculated if there is greater than 1 missing item.)  Quick Dash Disability/Symptom Score: 20.5  Minimally Clinically Important Difference (MCID): 15-20 points  Flavio, F. et al. (2013). Minimally clinically important difference of the disabilities of the arm, shoulder, and hand outcome measures (DASH) and its shortened version (Quick DASH). Journal of Orthopaedic & Sports Physical Therapy, 44(1), 30-39)   COGNITION: Overall cognitive status: Within functional limits for tasks assessed  SENSATION: Not tested  POSTURE: No Significant postural limitations  PALPATION: Mild R scapular dyskinesia near end-range flexion   CERVICAL ROM:   Active ROM A/PROM (deg) eval  Flexion WFL  Extension WFL  Right lateral flexion WFL  Left lateral flexion WFL  Right rotation WFL*  Left rotation WFL   (Blank rows = not tested)  *patient states Sometimes when I do this, I will feel some tingling R shoulder blade or R index finger  UPPER EXTREMITY MMT   MMT Right eval Left eval  Shoulder flexion 4+ 4  Shoulder extension    Shoulder abduction 4+ 4  Shoulder adduction    Shoulder extension    Shoulder  internal rotation 4- 4  Shoulder external rotation 4 4  Elbow flexion 5 5  Elbow extension 5 5  Wrist flexion 4 4-  Wrist extension 5 5  Wrist ulnar deviation    Wrist radial deviation    Wrist pronation    Wrist supination    Grip Strength (avg of 3 trials) 75# 71#   (Blank rows = not tested)   CERVICAL SPECIAL TESTS:  Upper limb tension test (ULTT):   Radial bias - negative  Median bias - positive    No change with L CS rotation    Small improvement with R CS rotaiton    Improved with wrist flexion    TREATMENT DATE:   [facility] Adult PT Treatment:                                                DATE: 06/16/24  Therapeutic Exercise: UBE level 2 fwd/bwd 3 mins ea Seated ITY 2# DB 2x10 Supine shoulder flexion  Shoulder abduction 2 x 10 3#  Supine alternating OH shoulder flexion 2# 2x10  Neuromuscular Re-ed: Seated ER GTB 2x15 Seated horizontal abduction GTB 2x15 High/low row 25# 2x15  Manual Therapy  scapular mobilization  Patient education - Tennis ball for self massage as needed   [facility] Adult PT Treatment:                                                DATE: 06/14/24 Therapeutic Exercise: UBE level 2 fwd/bwd 3 mins ea Upper trap stretch BIL 2x30 Seated ITY 2# DB 2x10 Supine alternating OH shoulder flexion 2# 2x10 Supine serratus punches 2x10 2# DB Supine chest press 2# DB 2x10 Neuromuscular Re-ed: Seated ER GTB 2x15 Seated horizontal abduction GTB 2x15 Median nerve glide tray RUE 2x10 High/low row 25# 2x15 Shoulder extension FM 20# 2x15   [facility] Adult PT Treatment:                                                DATE: 06/08/24 Therapeutic Exercise: UBE level 2 fwd/bwd 3 mins ea Seated ER GTB 2x15 Seated horizontal abduction GTB 3x15 Upper trap stretch BIL 2x30 Seated ITY 2# DB 2x10 Supine OH shoulder flexion 2# 2x10 Supine serratus punches 2x10 2# DB Median nerve glide x10 High/low row 25# 2x15 Shoulder extension FM 17# 2x15   PATIENT EDUCATION:   Education details: reviewed initial home exercise program; discussion of POC, prognosis and goals for skilled PT   Person educated: Patient Education method: Explanation, Demonstration, and Handouts Education comprehension: verbalized understanding, returned demonstration, and needs further education  HOME EXERCISE PROGRAM: Access Code: [email or web address] URL: [email or web address] Date: 05/10/2024 Prepared by: Marko Molt  Exercises - Supine Cervical Retraction with Towel  - 2 x daily - 7 x weekly - 2 sets - 10 reps - 3 sec hold - Single Arm Serratus Punches in Supine with Dumbbell  - 2 x daily - 7 x weekly - 2 sets - 10 reps - 3 sec hold - Seated Shoulder Shrug Circles AROM Backward  - 2 x daily - 7 x weekly - 1 sets - 5-10 reps - Supine Cervical Rotation AROM on Flat Ball  - 2 x daily - 7 x weekly - 2 sets - 5 reps -  Median Nerve Flossing - Tray  - 1 x daily - 7 x weekly - 2 sets - 10 reps  ASSESSMENT:  CLINICAL IMPRESSION: Adreanne had good tolerance of today's treatment session, which focused on progression of current exercises. Reviewed prescribed HEP and discussed plan for independence with HEP over the next 30 days. She plans to schedule for additional treatment if her symptoms persist following this trial over the holidays.    EVAL:Kathlean is a 58 y.o. female who was seen today for physical therapy evaluation and treatment for Neck Pain with Radicular Symptoms. At this time, she is experiences only R UE paresthesia. She is demonstrating mild R scapular dyskinsia with shoulder elevation, decreased BIL UE MMT scores, and positive R ULTT test with median nerve bias. 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 She requires skilled PT services at this time to address relevant deficits and return to PLOF.     OBJECTIVE IMPAIRMENTS: decreased activity tolerance, decreased strength, impaired UE functional  use, improper body mechanics, and pain.   ACTIVITY LIMITATIONS: carrying, lifting, sleeping, and reach over head  PARTICIPATION LIMITATIONS: cleaning, shopping, community activity, and occupation  PERSONAL FACTORS: 1-2 comorbidities: Relevant PMHx includes hypothyroidism, migraine, lat. Dorsi mm strain (2023), GAD are also affecting patient's functional outcome.   REHAB POTENTIAL: Good  CLINICAL DECISION MAKING: Stable/uncomplicated  EVALUATION COMPLEXITY: Low   GOALS: Goals reviewed with patient? YES  SHORT TERM GOALS: Target date: 05/31/2024   Patient will be independent with initial home program at least 3 days/week.  Baseline: provided at eval Goal Status: Ongoing   2.  Patient will report absence of pain and radicular symptoms with end range CS R rotation  Baseline: see objective measures Goal Status: Ongoing     LONG TERM GOALS: Target date: 06/21/2024   Patient will report improved overall functional ability with Quick Dash score of 10 or less .  Baseline: 20.5 Goal Status: INITIAL    2.  Patient will demonstrate 4+/5 MMT with BIL UE strength testing Baseline: see objective measures  Goal status: INITIAL  3.  Patient will report ability to perform all occupational tasks without R UE symptoms at least 2 days over past week.  Baseline: symptoms with prolonged seated work which is 90% of her work day  Goal status: INITIAL  4.  Patient will report ability to sleep through the night without R UE pain or paraesthesia upon waking.  Baseline: symptoms worse upon waking Goal status: INITIAL  5.  Patient will demonstrate ability to perform floor to waist lifting of at least 25# using appropriate body mechanics and with no more than minimal pain in order to safely perform normal daily/occupational tasks.   Goal Status: INITIAL   6.  Patient will demonstrate ability to perform overhead lifting of at least 10# using appropriate body mechanics and with no more than  minimal pain in order to safely perform normal daily/occupational tasks.   Goal Status: INITIAL   PLAN:  PT FREQUENCY: 1-2x/week  PT DURATION: 6 weeks  PLANNED INTERVENTIONS: [id number]- PT Re-evaluation, 97110-Therapeutic exercises, 97530- Therapeutic activity, 97112- Neuromuscular re-education, 97535- Self Care, [id number]- Manual therapy, [id number]- Electrical stimulation (unattended), [phone]- Electrical stimulation (manual), [id number]- Traction (mechanical), 20560 (1-2 muscles), 20561 (3+ muscles)- Dry Needling, Patient/Family education, Taping, Joint mobilization, Spinal manipulation, Spinal mobilization, Cryotherapy, and Moist heat  PLAN FOR NEXT SESSION: reassess progress towards goals/recert  Marko Molt, PT, DPT  06/19/2024 11:32 PM

## 2024-06-20 ENCOUNTER — Ambulatory Visit

## 2024-08-12 ENCOUNTER — Encounter: Admitting: Family

## 2025-01-13 ENCOUNTER — Ambulatory Visit: Admitting: Internal Medicine

## 2025-01-17 ENCOUNTER — Ambulatory Visit: Admitting: Internal Medicine
